# Patient Record
Sex: Male | Born: 1950 | Race: Asian | Hispanic: No | Marital: Married | Smoking: Never smoker
Health system: Southern US, Community
[De-identification: ages and names within clinical notes are randomized; demographics above are authoritative.]

## PROBLEM LIST (undated history)

## (undated) DIAGNOSIS — K219 Gastro-esophageal reflux disease without esophagitis: Secondary | ICD-10-CM

## (undated) DIAGNOSIS — I10 Essential (primary) hypertension: Secondary | ICD-10-CM

## (undated) DIAGNOSIS — E119 Type 2 diabetes mellitus without complications: Secondary | ICD-10-CM

## (undated) DIAGNOSIS — R569 Unspecified convulsions: Secondary | ICD-10-CM

## (undated) DIAGNOSIS — K259 Gastric ulcer, unspecified as acute or chronic, without hemorrhage or perforation: Secondary | ICD-10-CM

## (undated) DIAGNOSIS — F32A Depression, unspecified: Secondary | ICD-10-CM

## (undated) DIAGNOSIS — F329 Major depressive disorder, single episode, unspecified: Secondary | ICD-10-CM

## (undated) DIAGNOSIS — A159 Respiratory tuberculosis unspecified: Secondary | ICD-10-CM

## (undated) DIAGNOSIS — G709 Myoneural disorder, unspecified: Secondary | ICD-10-CM

## (undated) DIAGNOSIS — E7800 Pure hypercholesterolemia, unspecified: Secondary | ICD-10-CM

## (undated) HISTORY — DX: Depression, unspecified: F32.A

## (undated) HISTORY — PX: NO PAST SURGERIES: SHX2092

## (undated) HISTORY — DX: Essential (primary) hypertension: I10

## (undated) HISTORY — DX: Myoneural disorder, unspecified: G70.9

## (undated) HISTORY — DX: Respiratory tuberculosis unspecified: A15.9

## (undated) HISTORY — DX: Gastro-esophageal reflux disease without esophagitis: K21.9

## (undated) HISTORY — DX: Type 2 diabetes mellitus without complications: E11.9

## (undated) HISTORY — DX: Major depressive disorder, single episode, unspecified: F32.9

## (undated) HISTORY — DX: Unspecified convulsions: R56.9

## (undated) HISTORY — DX: Pure hypercholesterolemia, unspecified: E78.00

## (undated) HISTORY — DX: Gastric ulcer, unspecified as acute or chronic, without hemorrhage or perforation: K25.9

---

## 2012-04-18 ENCOUNTER — Other Ambulatory Visit: Payer: Self-pay | Admitting: Infectious Diseases

## 2012-04-18 ENCOUNTER — Ambulatory Visit
Admission: RE | Admit: 2012-04-18 | Discharge: 2012-04-18 | Disposition: A | Discharge status: Home / Self Care | Payer: No Typology Code available for payment source | Source: Ambulatory Visit | Attending: Infectious Diseases | Admitting: Infectious Diseases

## 2012-04-18 DIAGNOSIS — Z0289 Encounter for other administrative examinations: Secondary | ICD-10-CM

## 2014-10-07 ENCOUNTER — Ambulatory Visit: Payer: Self-pay | Attending: Internal Medicine

## 2015-03-12 ENCOUNTER — Encounter: Payer: Self-pay | Admitting: Family Medicine

## 2015-03-12 ENCOUNTER — Ambulatory Visit (INDEPENDENT_AMBULATORY_CARE_PROVIDER_SITE_OTHER): Payer: 59 | Admitting: Family Medicine

## 2015-03-12 VITALS — BP 154/94 | HR 60 | Ht 63.78 in | Wt 134.7 lb

## 2015-03-12 DIAGNOSIS — M62830 Muscle spasm of back: Secondary | ICD-10-CM

## 2015-03-12 DIAGNOSIS — I10 Essential (primary) hypertension: Secondary | ICD-10-CM | POA: Diagnosis not present

## 2015-03-12 DIAGNOSIS — K219 Gastro-esophageal reflux disease without esophagitis: Secondary | ICD-10-CM | POA: Diagnosis not present

## 2015-03-12 MED ORDER — AMLODIPINE BESYLATE 5 MG PO TABS: 5.0000 mg | ORAL_TABLET | Freq: Every day | ORAL | Status: DC

## 2015-03-12 MED ORDER — CYCLOBENZAPRINE HCL 5 MG PO TABS: 5.0000 mg | ORAL_TABLET | Freq: Three times a day (TID) | ORAL | Status: DC | PRN

## 2015-03-12 NOTE — Progress Notes (Signed)
    Subjective    Manuel Reyes is a 64 y.o. male that presents to establish care.   HPI:  1. Hypertension: Has not been taking his medications for the past week because he been out. He was getting his care at [facility] [address]. He reports taking one blood pressure pill daily. No chest pain or shortness of breath  2. Back pain: Symptoms started last year. Located in the right flank. Pain is characterized as cramping. Pain especially hurts in the morning and improves as the day go by. Pain is worse with lying down. He has not taken any medication. He is currently not working. He does not perform heavy lifting. No fevers or nausea. He reports some numbness in his left leg that radiates down to his ankles.  Past Medical History  Diagnosis Date  . Hypertension   . Stomach ulcer     History reviewed. No pertinent past surgical history.  No current outpatient prescriptions on file prior to visit.   No current facility-administered medications on file prior to visit.    No Known Allergies  History   Social History  . Marital Status: Married    Spouse Name: N/A  . Number of Children: N/A  . Years of Education: N/A   Social History Main Topics  . Smoking status: Never Smoker   . Smokeless tobacco: Not on file  . Alcohol Use: 0.6 oz/week    1 Cans of beer per week  . Drug Use: No  . Sexual Activity: Not Currently   Other Topics Concern  . None   Social History Narrative  . None    History reviewed. No pertinent family history.  ROS  Per HPI  Objective   BP 154/94 mmHg  Pulse 60  Ht 5' 3.78" (1.62 m)  Wt 134 lb 11.2 oz (61.1 kg)  BMI 23.28 kg/m2  SpO2 100%  General: Fair appearing, slightly unkempt. Translator sitting beside patient HEENT: TMs not visualized secondary to cerumen. Eyes anicteric. Nose normal. Oropharynx clear with severely deteriorated dentition with associated caries and plaques Respiratory/Chest: Clear to auscultation bilaterally,  no wheezing Cardiovascular: Regular rate and rhythm Gastrointestinal: Soft, slight tenderness, no rebound, no distension, no guarding Genitourinary: Not examined    Musculoskeletal: Right latissimus dorsi with spasm and slight tenderness. Thoracic kyphosis present. No midline tenderness. Straight leg negative Neuro: Alert, oriented. CN VIII dysfunctional. CN2-7, 9-12 intact Dermatologic: No rashes Psychiatric: Flat affect. No suicidal ideation  Meds ordered this encounter  Medications  . cyclobenzaprine (FLEXERIL) 5 MG tablet    Sig: Take 1 tablet (5 mg total) by mouth 3 (three) times daily as needed for muscle spasms.    Dispense:  20 tablet    Refill:  0  . DISCONTD: amLODipine (NORVASC) 5 MG tablet    Sig: Take 5 mg by mouth daily.  Marland Kitchen amLODipine (NORVASC) 5 MG tablet    Sig: Take 1 tablet (5 mg total) by mouth daily.    Dispense:  90 tablet    Refill:  2  . omeprazole (PRILOSEC) 20 MG capsule    Sig: Take 20 mg by mouth daily.    Assessment and Plan    Establish Care  Fasting labs: CBC w/ diff, CMP, Lipid panel, TSH, A1C  Please refer to problem based charting of assessment and plan

## 2015-03-12 NOTE — Patient Instructions (Signed)
Thank you for coming to see me today. It was a pleasure. Today we talked about:   Back pain: this is related to a big muscle spasm in your back. I want you to use warm compresses to help improve the cramp. I am also prescribing flexeril, which can cause you to be sleepier than usual.  Please make an appointment to see me in 4 weeks for follow-up.  If you have any questions or concerns, please do not hesitate to call the office at [phone].  Sincerely,  Cordelia Poche, MD    Muscle Cramps and Spasms Muscle cramps and spasms are when muscles tighten by themselves. They usually get better within minutes. Muscle cramps are painful. They are usually stronger and last longer than muscle spasms. Muscle spasms may or may not be painful. They can last a few seconds or much longer. HOME CARE  Drink enough fluid to keep your pee (urine) clear or pale yellow.  Massage, stretch, and relax the muscle.  Use a warm towel, heating pad, or warm shower water on tight muscles.  Place ice on the muscle if it is tender or in pain.  Put ice in a plastic bag.  Place a towel between your skin and the bag.  Leave the ice on for 15-20 minutes, 03-04 times a day.  Only take medicine as told by your doctor. GET HELP RIGHT AWAY IF:  Your cramps or spasms get worse, happen more often, or do not get better with time. MAKE SURE YOU:  Understand these instructions.  Will watch your condition.  Will get help right away if you are not doing well or get worse. Document Released: 09/28/2008 Document Revised: 02/10/2013 Document Reviewed: 10/02/2012 [facility] Patient Information 2015 [place], [place]. This information is not intended to replace advice given to you by your health care provider. Make sure you discuss any questions you have with your health care provider.

## 2015-03-18 DIAGNOSIS — M62830 Muscle spasm of back: Secondary | ICD-10-CM | POA: Insufficient documentation

## 2015-03-18 DIAGNOSIS — I10 Essential (primary) hypertension: Secondary | ICD-10-CM | POA: Insufficient documentation

## 2015-03-18 DIAGNOSIS — K219 Gastro-esophageal reflux disease without esophagitis: Secondary | ICD-10-CM | POA: Insufficient documentation

## 2015-03-18 NOTE — Assessment & Plan Note (Signed)
   Flexeril  Warm compress  Handout given

## 2015-03-18 NOTE — Assessment & Plan Note (Signed)
Patient previously on Amlodipine. No current side effects  Amlodipine 5mg  daily  Recheck blood pressure in two weeks with nurse

## 2015-03-18 NOTE — Assessment & Plan Note (Signed)
Refill omeprazole

## 2015-03-19 ENCOUNTER — Other Ambulatory Visit (INDEPENDENT_AMBULATORY_CARE_PROVIDER_SITE_OTHER): Payer: 59

## 2015-03-19 DIAGNOSIS — I10 Essential (primary) hypertension: Secondary | ICD-10-CM | POA: Diagnosis not present

## 2015-03-19 LAB — CBC WITH DIFFERENTIAL/PLATELET
Basophils Absolute: 0.1 10*3/uL (ref 0.0–0.1)
Basophils Relative: 1 % (ref 0–1)
EOS ABS: 0.1 10*3/uL (ref 0.0–0.7)
EOS PCT: 1 % (ref 0–5)
HCT: 49.6 % (ref 39.0–52.0)
Hemoglobin: 17.2 g/dL — ABNORMAL HIGH (ref 13.0–17.0)
LYMPHS ABS: 2.1 10*3/uL (ref 0.7–4.0)
Lymphocytes Relative: 38 % (ref 12–46)
MCH: 30 pg (ref 26.0–34.0)
MCHC: 34.7 g/dL (ref 30.0–36.0)
MCV: 86.4 fL (ref 78.0–100.0)
MONOS PCT: 15 % — AB (ref 3–12)
MPV: 11.1 fL (ref 8.6–12.4)
Monocytes Absolute: 0.8 10*3/uL (ref 0.1–1.0)
NEUTROS PCT: 45 % (ref 43–77)
Neutro Abs: 2.5 10*3/uL (ref 1.7–7.7)
PLATELETS: 178 10*3/uL (ref 150–400)
RBC: 5.74 MIL/uL (ref 4.22–5.81)
RDW: 14.4 % (ref 11.5–15.5)
WBC: 5.6 10*3/uL (ref 4.0–10.5)

## 2015-03-19 LAB — COMPLETE METABOLIC PANEL WITH GFR
ALK PHOS: 105 U/L (ref 39–117)
ALT: 11 U/L (ref 0–53)
AST: 21 U/L (ref 0–37)
Albumin: 4.1 g/dL (ref 3.5–5.2)
BILIRUBIN TOTAL: 0.8 mg/dL (ref 0.2–1.2)
BUN: 12 mg/dL (ref 6–23)
CO2: 29 mEq/L (ref 19–32)
CREATININE: 0.71 mg/dL (ref 0.50–1.35)
Calcium: 9.4 mg/dL (ref 8.4–10.5)
Chloride: 99 mEq/L (ref 96–112)
GFR, Est African American: >89 mL/min
Glucose, Bld: 94 mg/dL (ref 70–99)
Potassium: 4.1 mEq/L (ref 3.5–5.3)
Sodium: 136 mEq/L (ref 135–145)
Total Protein: 7.9 g/dL (ref 6.0–8.3)

## 2015-03-19 LAB — TSH: TSH: 1.097 u[IU]/mL (ref 0.350–4.500)

## 2015-03-19 LAB — LIPID PANEL
CHOLESTEROL: 232 mg/dL — AB (ref 0–200)
HDL: 55 mg/dL (ref >=40)
LDL Cholesterol: 157 mg/dL — ABNORMAL HIGH (ref 0–99)
Total CHOL/HDL Ratio: 4.2 Ratio
Triglycerides: 98 mg/dL (ref <150)
VLDL: 20 mg/dL (ref 0–40)

## 2015-03-19 LAB — POCT GLYCOSYLATED HEMOGLOBIN (HGB A1C): HEMOGLOBIN A1C: 5.3

## 2015-03-19 NOTE — Progress Notes (Signed)
CMP,CBC,FLP,TSH AND A1C DONE TODAY Manuel Reyes

## 2015-04-15 ENCOUNTER — Ambulatory Visit (INDEPENDENT_AMBULATORY_CARE_PROVIDER_SITE_OTHER): Payer: 59 | Admitting: Family Medicine

## 2015-04-15 ENCOUNTER — Encounter: Payer: Self-pay | Admitting: Family Medicine

## 2015-04-15 VITALS — BP 145/96 | HR 69 | Temp 97.9°F | Ht 64.0 in | Wt 130.9 lb

## 2015-04-15 DIAGNOSIS — I10 Essential (primary) hypertension: Secondary | ICD-10-CM | POA: Diagnosis not present

## 2015-04-15 DIAGNOSIS — E785 Hyperlipidemia, unspecified: Secondary | ICD-10-CM | POA: Diagnosis not present

## 2015-04-15 DIAGNOSIS — M62830 Muscle spasm of back: Secondary | ICD-10-CM

## 2015-04-15 DIAGNOSIS — R51 Headache: Secondary | ICD-10-CM

## 2015-04-15 DIAGNOSIS — R519 Headache, unspecified: Secondary | ICD-10-CM

## 2015-04-15 MED ORDER — AMLODIPINE BESYLATE 5 MG PO TABS: 5.0000 mg | ORAL_TABLET | Freq: Every day | ORAL | Status: DC

## 2015-04-15 MED ORDER — ROSUVASTATIN CALCIUM 20 MG PO TABS: 20.0000 mg | ORAL_TABLET | Freq: Every day | ORAL | Status: DC

## 2015-04-15 MED ORDER — VENLAFAXINE HCL ER 37.5 MG PO CP24: 37.5000 mg | ORAL_CAPSULE | Freq: Every day | ORAL | Status: DC

## 2015-04-15 NOTE — Patient Instructions (Signed)
Thank you for coming to see me today. It was a pleasure. Today we talked about:   Muscle spasm: I will refer you to physical therapy  Hypertension: I am increasing you to amlodipine 10mg  daily  Hyperlipidemia: I am starting you on crestor 20mg  daily  Headache: This may be a migraine. I will try you on Effexor 37.5mg  daily to see if this helps with your headaches  Please make an appointment to see me in 4 weeks for follow-up.  If you have any questions or concerns, please do not hesitate to call the office at [phone].  Sincerely,  Cordelia Poche, MD

## 2015-04-15 NOTE — Progress Notes (Signed)
    Subjective    Manuel Reyes is a 64 y.o. male that presents for a follow-up visit for chronic issues.   1. Back pain: Symptoms are persisting. They are unchanged. No physical activity to aggravate symptoms. He states walking every day for about 20-30 minutes. Pain is worse when moving. Straightening his back improves his pain, however, he is unable to keep his back straight for too long as he gets tired. His back pain affects him negatively at work.  2. Hypertension: He is adherent with amlodipine 5mg  daily. He reports no side effects. He reports no chest pain or shortness of breath.  3. Headaches: He has occasional headaches, which he has had for about one year every day and have worsened. Headaches are sharp and located bilateral at times. Patient states he has photophobia with these headaches. Patient states he cannot sleep when having these headaches and that they last about 2-3 hours.   4. Hypercholesterolemia: Recent lipid panel significant for elevated LDL to 157.  History  Substance Use Topics  . Smoking status: Never Smoker   . Smokeless tobacco: Not on file  . Alcohol Use: 0.6 oz/week    1 Cans of beer per week    No Known Allergies  No orders of the defined types were placed in this encounter.    ROS  Per HPI   Objective   BP 145/96 mmHg  Pulse 69  Temp(Src) 97.9 F (36.6 C) (Oral)  Ht 5\' 4"  (1.626 m)  Wt 130 lb 14.4 oz (59.376 kg)  BMI 22.46 kg/m2  General: Well appearing, no distress HEENT: No temporal tenderness. No scalp tenderness. No lesions noted on scalp.    Musculoskeletal: Severe spasm of bilateral latissimus dorsi with tenderness. No lesions noted. Thoracic kyphosis present Neuro: Alert, oriented. CN II, III, IV, V, VI, VII intact  Assessment and Plan   Please refer to problem based charting of assessment and plan

## 2015-04-16 DIAGNOSIS — R51 Headache: Secondary | ICD-10-CM

## 2015-04-16 DIAGNOSIS — R519 Headache, unspecified: Secondary | ICD-10-CM | POA: Insufficient documentation

## 2015-04-16 NOTE — Assessment & Plan Note (Signed)
ASCVD risk of 15.3%. Currently not on a statin. No CAD equivalents currently.  Primary prevention  Crestor 20mg  daily

## 2015-04-16 NOTE — Assessment & Plan Note (Signed)
Slightly uncontrolled today. Will increase to amlodipine 10mg  daily

## 2015-04-16 NOTE — Assessment & Plan Note (Signed)
Refer to physical therapy 

## 2015-04-16 NOTE — Assessment & Plan Note (Signed)
Possible migraine headache. Will trial Effexor 37.5mg  daily

## 2015-05-21 ENCOUNTER — Encounter: Payer: Self-pay | Admitting: Family Medicine

## 2015-05-21 ENCOUNTER — Ambulatory Visit (HOSPITAL_COMMUNITY)
Admission: RE | Admit: 2015-05-21 | Discharge: 2015-05-21 | Disposition: A | Discharge status: Home / Self Care | Payer: 59 | Source: Ambulatory Visit | Attending: Family Medicine | Admitting: Family Medicine

## 2015-05-21 ENCOUNTER — Ambulatory Visit (INDEPENDENT_AMBULATORY_CARE_PROVIDER_SITE_OTHER): Payer: 59 | Admitting: Family Medicine

## 2015-05-21 VITALS — BP 152/91 | HR 72 | Temp 97.6°F | Ht 64.0 in | Wt 128.3 lb

## 2015-05-21 DIAGNOSIS — M62830 Muscle spasm of back: Secondary | ICD-10-CM

## 2015-05-21 DIAGNOSIS — R002 Palpitations: Secondary | ICD-10-CM | POA: Insufficient documentation

## 2015-05-21 DIAGNOSIS — K219 Gastro-esophageal reflux disease without esophagitis: Secondary | ICD-10-CM | POA: Diagnosis not present

## 2015-05-21 DIAGNOSIS — I10 Essential (primary) hypertension: Secondary | ICD-10-CM | POA: Diagnosis not present

## 2015-05-21 DIAGNOSIS — R001 Bradycardia, unspecified: Secondary | ICD-10-CM | POA: Diagnosis not present

## 2015-05-21 DIAGNOSIS — R0600 Dyspnea, unspecified: Secondary | ICD-10-CM | POA: Insufficient documentation

## 2015-05-21 MED ORDER — AMLODIPINE BESYLATE 10 MG PO TABS: 10.0000 mg | ORAL_TABLET | Freq: Every day | ORAL | Status: DC

## 2015-05-21 NOTE — Patient Instructions (Addendum)
Thank you for coming to see me today. It was a pleasure. Today we talked about:   Hypertension: I will increase your amlodipine. Please take Amlodipine 10mg  daily  Reflux: no changes  Palpitations/shortness of breath: I did an EKG in the office. I will get an echocardiogram.  Please make an appointment to see me in 3 months follow-up  If you have any questions or concerns, please do not hesitate to call the office at [phone].  Sincerely,  Cordelia Poche, MD

## 2015-05-21 NOTE — Assessment & Plan Note (Signed)
Uncontrolled. I thought I had increased his amlodipine to 10 mg last visit but appears I did not. Will increase to Amlodipine 10mg  daily.

## 2015-05-21 NOTE — Progress Notes (Signed)
    Subjective    Manuel Reyes is a 64 y.o. male that presents for a follow-up visit for chronic issues.   1. Request for disability: Patient states he is having issues remembering, some dizziness, in addition to his back pain that makes him feel like he cannot work and is requesting disability.  2. Hypertension: Adherent with amlodipine 5mg  daily. No chest pain but states he sometimes has shortness of breath at night. He sleeps on one pillow at home. He states he has some PND. No coughing or wheezing. No leg swelling. He has frequent palpitations that occur a few times per day without any obvious stimulus.  3. GERD: Still having symptoms. He is adherent with omeprazole and has adjusted his diet a little. He has symptoms with meats and butter, of which he is eating less.  History  Substance Use Topics  . Smoking status: Never Smoker   . Smokeless tobacco: Not on file  . Alcohol Use: 0.6 oz/week    1 Cans of beer per week    No Known Allergies  No orders of the defined types were placed in this encounter.    ROS  Per HPI   Objective   BP 152/91 mmHg  Pulse 72  Temp(Src) 97.6 F (36.4 C) (Oral)  Ht 5\' 4"  (1.626 m)  Wt 128 lb 4.8 oz (58.196 kg)  BMI 22.01 kg/m2  General: Well appearing. Cardiovascular: RRR, no murmur, mild JVP, no bruits noted Respiratory: CTAB, no wheezing Extremities: no swelling in legs  Assessment and Plan   Please refer to problem based charting of assessment and plan

## 2015-05-21 NOTE — Assessment & Plan Note (Signed)
Story somewhat suggest of possible heart failure. Exam benign except for some jvp. No other signs of fluid overload.

## 2015-05-21 NOTE — Assessment & Plan Note (Signed)
Discussed treatment by avoiding triggers and continuing omeprazole as needed

## 2015-06-16 ENCOUNTER — Ambulatory Visit (HOSPITAL_COMMUNITY): Payer: 59 | Attending: Cardiovascular Disease

## 2015-06-16 ENCOUNTER — Other Ambulatory Visit: Payer: Self-pay

## 2015-06-16 DIAGNOSIS — I10 Essential (primary) hypertension: Secondary | ICD-10-CM | POA: Insufficient documentation

## 2015-06-16 DIAGNOSIS — R0600 Dyspnea, unspecified: Secondary | ICD-10-CM | POA: Diagnosis not present

## 2015-06-16 DIAGNOSIS — I071 Rheumatic tricuspid insufficiency: Secondary | ICD-10-CM | POA: Diagnosis not present

## 2015-06-16 DIAGNOSIS — E785 Hyperlipidemia, unspecified: Secondary | ICD-10-CM | POA: Diagnosis not present

## 2015-06-24 ENCOUNTER — Encounter: Payer: Self-pay | Admitting: Family Medicine

## 2015-06-29 ENCOUNTER — Telehealth: Payer: Self-pay | Admitting: *Deleted

## 2015-06-29 NOTE — Telephone Encounter (Signed)
-----   Message from Mariel Aloe, MD sent at 06/27/2015  9:35 AM EDT ----- I meant for you to just call him in for an appointment to discuss the results. I put a note before that just explaining the pertinent aspects of the results. No need to explain any of it to him. I'll do that when I see him. He does not speak [place] and I doubt a phone call to explain any of it would benefit him. Thanks! ----- Message -----    From: Valerie Roys, CMA    Sent: 06/25/2015  11:29 AM      To: Mariel Aloe, MD  I am unsure of what this means and feel uncomfortable explaining this to the patient since it is abnormal. Johnney Ou  ----- Message -----    From: Mariel Aloe, MD    Sent: 06/24/2015  10:40 AM      To: [facility]  Grade 1 diastolic with mild LVH. With symptoms of PND and mention of elevated CVP, possible could have some extra fluid. Will have patient follow-up in clinic to discuss results and trial on diuretic if still symptomatic. Please call patient to schedule an appointment.

## 2015-06-29 NOTE — Telephone Encounter (Signed)
Used pac[employer] interpreter [place] [id number].  Both home and cell number listed for patient were the wrong number.  Will mail patient a letter translated from [employer] to please contact office for an appt. Ervine Witucki,CMA

## 2015-10-28 ENCOUNTER — Ambulatory Visit (INDEPENDENT_AMBULATORY_CARE_PROVIDER_SITE_OTHER): Payer: Medicaid Other | Admitting: Family Medicine

## 2015-10-28 VITALS — BP 182/103 | HR 83 | Temp 97.9°F | Wt 135.6 lb

## 2015-10-28 DIAGNOSIS — R413 Other amnesia: Secondary | ICD-10-CM | POA: Diagnosis not present

## 2015-10-28 DIAGNOSIS — I5032 Chronic diastolic (congestive) heart failure: Secondary | ICD-10-CM | POA: Insufficient documentation

## 2015-10-28 DIAGNOSIS — I10 Essential (primary) hypertension: Secondary | ICD-10-CM

## 2015-10-28 DIAGNOSIS — Z23 Encounter for immunization: Secondary | ICD-10-CM

## 2015-10-28 DIAGNOSIS — K219 Gastro-esophageal reflux disease without esophagitis: Secondary | ICD-10-CM | POA: Diagnosis not present

## 2015-10-28 MED ORDER — AMLODIPINE BESYLATE 10 MG PO TABS: 10.0000 mg | ORAL_TABLET | Freq: Every day | ORAL | Status: DC
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
## 2015-10-28 NOTE — Assessment & Plan Note (Addendum)
Uncontrolled today, although patient has been non-adherent with regimen. Refill amlodipine 10mg  daily

## 2015-10-28 NOTE — Patient Instructions (Signed)
Thank you for coming to see me today. It was a pleasure. Today we talked about:   Hypertension: I am refilling your amlodipine 10mg   GERD: No changes  Memory loss: I would like for you to bring your son with you so we can look into this further.  Please make an appointment to see me in 1 month for follow-up of memory.  If you have any questions or concerns, please do not hesitate to call the office at [phone].  Sincerely,  Cordelia Poche, MD

## 2015-10-30 DIAGNOSIS — R413 Other amnesia: Secondary | ICD-10-CM | POA: Insufficient documentation

## 2015-10-30 NOTE — Assessment & Plan Note (Signed)
Controlled. No changes. 

## 2015-10-30 NOTE — Assessment & Plan Note (Signed)
Possibly related to age. Patient receives help from son. Recommend that patient and son see me to discuss patient's concerns regarding memory. Will perform workup at that time, including mini-cog and/or MOCA.

## 2015-11-26 ENCOUNTER — Ambulatory Visit (INDEPENDENT_AMBULATORY_CARE_PROVIDER_SITE_OTHER): Payer: Medicaid Other | Admitting: Family Medicine

## 2015-11-26 VITALS — BP 138/83 | HR 77 | Temp 97.9°F | Wt 136.8 lb

## 2015-11-26 DIAGNOSIS — R569 Unspecified convulsions: Secondary | ICD-10-CM | POA: Diagnosis present

## 2015-11-26 DIAGNOSIS — R296 Repeated falls: Secondary | ICD-10-CM

## 2015-11-26 DIAGNOSIS — R413 Other amnesia: Secondary | ICD-10-CM | POA: Diagnosis not present

## 2015-11-26 DIAGNOSIS — R42 Dizziness and giddiness: Secondary | ICD-10-CM | POA: Diagnosis not present

## 2015-11-26 LAB — CBC WITH DIFFERENTIAL/PLATELET
BASOS ABS: 0 10*3/uL (ref 0.0–0.1)
Basophils Relative: 0 % (ref 0–1)
Eosinophils Absolute: 0 10*3/uL (ref 0.0–0.7)
Eosinophils Relative: 0 % (ref 0–5)
HEMATOCRIT: 50.2 % (ref 39.0–52.0)
HEMOGLOBIN: 17.2 g/dL — AB (ref 13.0–17.0)
LYMPHS ABS: 1.1 10*3/uL (ref 0.7–4.0)
LYMPHS PCT: 14 % (ref 12–46)
MCH: 29.7 pg (ref 26.0–34.0)
MCHC: 34.3 g/dL (ref 30.0–36.0)
MCV: 86.7 fL (ref 78.0–100.0)
MONO ABS: 0.7 10*3/uL (ref 0.1–1.0)
MPV: 11 fL (ref 8.6–12.4)
Monocytes Relative: 9 % (ref 3–12)
NEUTROS ABS: 6 10*3/uL (ref 1.7–7.7)
Neutrophils Relative %: 77 % (ref 43–77)
Platelets: 197 10*3/uL (ref 150–400)
RBC: 5.79 MIL/uL (ref 4.22–5.81)
RDW: 14 % (ref 11.5–15.5)
WBC: 7.8 10*3/uL (ref 4.0–10.5)

## 2015-11-26 LAB — COMPLETE METABOLIC PANEL WITH GFR
ALT: 11 U/L (ref 9–46)
AST: 20 U/L (ref 10–35)
Albumin: 4.4 g/dL (ref 3.6–5.1)
Alkaline Phosphatase: 100 U/L (ref 40–115)
BUN: 22 mg/dL (ref 7–25)
CO2: 25 mmol/L (ref 20–31)
CREATININE: 0.69 mg/dL — AB (ref 0.70–1.25)
Calcium: 9.2 mg/dL (ref 8.6–10.3)
Chloride: 101 mmol/L (ref 98–110)
GFR, Est African American: >89 mL/min (ref >=60)
GFR, Est Non African American: >89 mL/min (ref >=60)
Glucose, Bld: 90 mg/dL (ref 65–99)
Potassium: 4.2 mmol/L (ref 3.5–5.3)
Sodium: 138 mmol/L (ref 135–146)
TOTAL PROTEIN: 8.1 g/dL (ref 6.1–8.1)
Total Bilirubin: 0.4 mg/dL (ref 0.2–1.2)

## 2015-11-26 LAB — VITAMIN B12: Vitamin B-12: 627 pg/mL (ref 211–911)

## 2015-11-26 LAB — FOLATE: FOLATE: 11.1 ng/mL

## 2015-11-26 NOTE — Patient Instructions (Signed)
Thank you for coming to see me today. It was a pleasure. Today we talked about:   Falls: this may be secondary to seizures. I will refer you to neurology. I will also get an MRI of your brain. If you notice any more episodes please call [phone] and head to the emergency department.  Memory: I am getting some labs. Also, please follow-up with our geriatric clinic for follow-up. You can also follow-up with me if you're not able to get in soon. Otherwise, make an appointment to see me after your MRI and/or neurology visit  If you have any questions or concerns, please do not hesitate to call the office at [phone].  Sincerely,  Cordelia Poche, MD
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
## 2015-11-27 LAB — RPR

## 2015-11-27 LAB — HIV ANTIBODY (ROUTINE TESTING W REFLEX): HIV 1&2 Ab, 4th Generation: NONREACTIVE

## 2015-12-01 ENCOUNTER — Encounter: Payer: Self-pay | Admitting: Family Medicine

## 2015-12-01 DIAGNOSIS — R569 Unspecified convulsions: Secondary | ICD-10-CM | POA: Insufficient documentation

## 2015-12-01 DIAGNOSIS — R296 Repeated falls: Secondary | ICD-10-CM | POA: Insufficient documentation

## 2015-12-01 NOTE — Assessment & Plan Note (Signed)
Seems very likely from history provided. No recent head trauma so doubt secondary to subdural hemorrhage. Will obtain MRI and refer to neurology for follow-up

## 2015-12-01 NOTE — Assessment & Plan Note (Signed)
Per son's report, sounds consistent with early dementia. Did not give mini moca since did not have access to proper language. Recommend follow-up with geriatric clinic. Would be happy to see him again if not able to get in sooner with geriatric clinic. Will obtain  CBC, CMP, folate, B12, HIV and RPR

## 2015-12-02 ENCOUNTER — Encounter: Payer: Self-pay | Admitting: Family Medicine

## 2015-12-06 ENCOUNTER — Ambulatory Visit (HOSPITAL_COMMUNITY): Payer: Medicaid Other

## 2015-12-10 ENCOUNTER — Ambulatory Visit (HOSPITAL_COMMUNITY)
Admission: RE | Admit: 2015-12-10 | Discharge: 2015-12-10 | Disposition: A | Discharge status: Home / Self Care | Payer: Medicaid Other | Source: Ambulatory Visit | Attending: Family Medicine | Admitting: Family Medicine

## 2015-12-10 DIAGNOSIS — R42 Dizziness and giddiness: Secondary | ICD-10-CM | POA: Diagnosis not present

## 2015-12-10 DIAGNOSIS — R296 Repeated falls: Secondary | ICD-10-CM | POA: Insufficient documentation

## 2015-12-10 DIAGNOSIS — R9089 Other abnormal findings on diagnostic imaging of central nervous system: Secondary | ICD-10-CM | POA: Insufficient documentation

## 2015-12-10 DIAGNOSIS — R413 Other amnesia: Secondary | ICD-10-CM | POA: Diagnosis not present

## 2015-12-10 DIAGNOSIS — R51 Headache: Secondary | ICD-10-CM | POA: Diagnosis not present

## 2015-12-10 MED ORDER — GADOBENATE DIMEGLUMINE 529 MG/ML IV SOLN
15.0000 mL | Freq: Once | INTRAVENOUS | Status: AC | PRN
  Administered 2015-12-10: 12 mL via INTRAVENOUS

## 2015-12-31 ENCOUNTER — Ambulatory Visit: Payer: Medicaid Other | Admitting: Family Medicine

## 2016-01-14 ENCOUNTER — Ambulatory Visit (INDEPENDENT_AMBULATORY_CARE_PROVIDER_SITE_OTHER): Payer: Medicaid Other | Admitting: Family Medicine

## 2016-01-14 ENCOUNTER — Encounter: Payer: Self-pay | Admitting: Family Medicine

## 2016-01-14 VITALS — BP 140/104 | HR 60 | Temp 98.0°F | Ht 64.0 in | Wt 135.4 lb

## 2016-01-14 DIAGNOSIS — R51 Headache: Secondary | ICD-10-CM

## 2016-01-14 DIAGNOSIS — I10 Essential (primary) hypertension: Secondary | ICD-10-CM | POA: Diagnosis not present

## 2016-01-14 DIAGNOSIS — Z789 Other specified health status: Secondary | ICD-10-CM | POA: Diagnosis not present

## 2016-01-14 DIAGNOSIS — M546 Pain in thoracic spine: Secondary | ICD-10-CM | POA: Diagnosis present

## 2016-01-14 DIAGNOSIS — G8929 Other chronic pain: Secondary | ICD-10-CM

## 2016-01-14 MED ORDER — DICLOFENAC SODIUM 75 MG PO TBEC: 75.0000 mg | DELAYED_RELEASE_TABLET | Freq: Two times a day (BID) | ORAL | Status: DC | PRN

## 2016-01-14 NOTE — Progress Notes (Signed)
   Subjective: CC: sinuses [id number] Manuel Reyes is a 65 y.o. male presenting to clinic today for same day appointment. PCP: Cordelia Poche, MD Concerns today include:  Patient presents to the office with headache and pain in the right side of his back that has been going on for several years.  He notes that he has been seen here several times with this complaint and was placed on flexeril for his back, which makes him very dizzy and he feels like he is going to faint.  He had a MRI brain in February that was unremarkable for acute processes.  He was referred to Neurology in January.  Patient reports that they saw the neurologist over 2 months ago, but we discussed that the referral was only placed a little over a month and a half ago.  Son reports that they must have not seen them then.  Headache: reports as chronic, constant, shocking pain in the right posterior aspect of his head.  occ dizziness, nausea, phonophobia.  No photophobia, visual disturbance, imbalance  Back pain: reports as stabbing in the right side of his back, been there for 4 years, worse over the last 3 years.  No falls, numbness, tingling.  Not relieved by Flexeril.  Social History Reviewed. FamHx and MedHx reviewed.  Please see EMR. Health Maintenance: Colonoscopy due  ROS: Per HPI  Objective: Office vital signs reviewed. BP 140/104 mmHg  Pulse 60  Temp(Src) 98 F (36.7 C) (Oral)  Ht 5\' 4"  (1.626 m)  Wt 135 lb 6.4 oz (61.417 kg)  BMI 23.23 kg/m2  Physical Examination:  General: Awake, alert, thin, elderly male, No acute distress HEENT: Normal    Neck: No masses palpated. No lymphadenopathy    Ears: Tympanic membranes intact, dulled light reflex, no erythema, no bulging    Eyes: PERRLA, formal testing of EOMI difficult due to patient's inability to comply with exam, grossly appears to be intact.    Nose: nasal turbinates moist    Throat: moist mucus membranes, no erythema Cardio: regular rate and rhythm, S1S2  heard, no murmurs appreciated Pulm: clear to auscultation bilaterally, no wheezes, rhonchi or rales, normal WOB on room air. MSK: Normal gait and station  Spine: increased thoracic kyphosis, mild TTP to right posterior ribs 8-11, no palpable bony abnormalities in this region, no midline TTP to spine Neuro: Strength and sensation grossly intact, CN 2-12 grossly in tact.  Assessment/ Plan: 65 y.o. male   1. Right-sided thoracic back pain.  Increased kyphosis of thoracic spine.  No neurologic deficits on physical exam - Refer to Physical therapy, suspect that this will take a long time to improve considering the chronicity - Stop Flexeril, start Voltaren prn - Take with meals - Return precautions reviewed  2. Chronic nonintractable headache, unspecified headache type.  Migraine vs tension vs uncontrolled HTN. - Use Tylenol and Voltaren as needed - Referral to neuro.  appt set up today.  3. Essential hypertension - Diastolic BP elevated.  Reports compliance with norvasc.  Took med this am. - Recommend repeat BP in 1 week.  May consider adding a med to help better control BP.  4. Language barrier affecting health care - [employer] video interpreter [place] ID [id number] used for [place] translation - Neuro appt set up during this appointment  Total time spent with patient 35 minutes.  Greater than 50% of encounter spent in coordination of care/counseling.   Janora Norlander, DO PGY-2, [place]

## 2016-01-14 NOTE — Patient Instructions (Signed)
You should follow up with the neurologist.  I have placed an order for physical therapy as well.  Stop taking cyclobenzaprine and start taking diclofenac.

## 2016-01-21 ENCOUNTER — Ambulatory Visit (INDEPENDENT_AMBULATORY_CARE_PROVIDER_SITE_OTHER): Payer: Medicaid Other | Admitting: Neurology

## 2016-01-21 ENCOUNTER — Encounter: Payer: Self-pay | Admitting: Neurology

## 2016-01-21 VITALS — BP 158/93 | HR 57 | Ht 64.0 in | Wt 138.0 lb

## 2016-01-21 DIAGNOSIS — R413 Other amnesia: Secondary | ICD-10-CM | POA: Diagnosis not present

## 2016-01-21 DIAGNOSIS — R404 Transient alteration of awareness: Secondary | ICD-10-CM | POA: Diagnosis not present

## 2016-01-21 DIAGNOSIS — G629 Polyneuropathy, unspecified: Secondary | ICD-10-CM | POA: Diagnosis not present

## 2016-01-21 MED ORDER — LEVETIRACETAM 500 MG PO TABS: 500.0000 mg | ORAL_TABLET | Freq: Two times a day (BID) | ORAL | Status: DC

## 2016-01-21 NOTE — Patient Instructions (Signed)
Remember to drink plenty of fluid, eat healthy meals and do not skip any meals. Try to eat protein with a every meal and eat a healthy snack such as fruit or nuts in between meals. Try to keep a regular sleep-wake schedule and try to exercise daily, particularly in the form of walking, 20-30 minutes a day, if you can.   As far as your medications are concerned, I would like to suggest; Keppra 500mg  twice daily  As far as diagnostic testing: Labs.  I would like to see you back in 4-6 weeks, sooner if we need to. Please call us with any interim questions, concerns, problems, updates or refill requests.   Our phone number is [phone]. We also have an after hours call service for urgent matters and there is a physician on-call for urgent questions. For any emergencies you know to call 911 or go to the nearest emergency room
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
## 2016-01-26 LAB — MULTIPLE MYELOMA PANEL, SERUM
ALBUMIN SERPL ELPH-MCNC: 3.8 g/dL (ref 2.9–4.4)
Albumin/Glob SerPl: 1 (ref 0.7–1.7)
Alpha 1: 0.2 g/dL (ref 0.0–0.4)
Alpha2 Glob SerPl Elph-Mcnc: 0.7 g/dL (ref 0.4–1.0)
B-GLOBULIN SERPL ELPH-MCNC: 1.3 g/dL (ref 0.7–1.3)
GAMMA GLOB SERPL ELPH-MCNC: 1.6 g/dL (ref 0.4–1.8)
GLOBULIN, TOTAL: 3.9 g/dL (ref 2.2–3.9)
IGA/IMMUNOGLOBULIN A, SERUM: 407 mg/dL (ref 61–437)
IgG (Immunoglobin G), Serum: 1379 mg/dL (ref 700–1600)
IgM (Immunoglobulin M), Srm: 92 mg/dL (ref 20–172)
TOTAL PROTEIN: 7.7 g/dL (ref 6.0–8.5)

## 2016-01-26 LAB — ANA W/REFLEX: Anti Nuclear Antibody(ANA): NEGATIVE

## 2016-01-27 ENCOUNTER — Telehealth: Payer: Self-pay | Admitting: *Deleted

## 2016-01-27 NOTE — Telephone Encounter (Signed)
-----   Message from Melvenia Beam, MD sent at 01/27/2016 12:22 PM EDT ----- Labs normal. thanks

## 2016-01-27 NOTE — Telephone Encounter (Signed)
Called and spoke with language line interpreter. Interpreter :[id number]. She LVM for pt to call back. Gave [facility] Phone number. Ok to inform pt labs normal. Might need to call back with interpreter though.

## 2016-02-08 NOTE — Telephone Encounter (Signed)
Called and spoke with language line interpreter. Interpreter ID: [id number]. He called [phone]. spoek to pt abs are normal per Dr Jaynee Eagles. Advised him to keep appt for tomorrow for EEG at 830am, check in 8am. He verbalized understanding.

## 2016-02-09 ENCOUNTER — Ambulatory Visit (INDEPENDENT_AMBULATORY_CARE_PROVIDER_SITE_OTHER): Payer: Medicaid Other | Admitting: Neurology

## 2016-02-09 DIAGNOSIS — R404 Transient alteration of awareness: Secondary | ICD-10-CM

## 2016-02-09 DIAGNOSIS — R410 Disorientation, unspecified: Secondary | ICD-10-CM

## 2016-02-09 NOTE — Procedures (Signed)
    History:  Manuel Reyes is a 65 year old gentleman with a history of confusion over the last 2 years. The patient is having unusual behavior, cooking on the stove without turning the stove on, and some history of episodes of passing out. The patient is being evaluated for possible seizures.  This is a routine EEG. No skull defects are noted. Medications include Norvasc, Voltaren, Keppra, Prilosec, Crestor, and Effexor.   EEG classification: Normal awake  Description of the recording: The background rhythms of this recording consists of a fairly well modulated medium amplitude alpha rhythm of 10 Hz that is reactive to eye opening and closure. As the record progresses, the patient appears to remain in the waking state throughout the recording. Photic stimulation was performed, resulting in a bilateral and symmetric photic driving response. Hyperventilation was also performed, resulting in a minimal buildup of the background rhythm activities without significant slowing seen. At no time during the recording does there appear to be evidence of spike or spike wave discharges or evidence of focal slowing. EKG monitor shows no evidence of cardiac rhythm abnormalities with a heart rate of 60.  Impression: This is a normal EEG recording in the waking state. No evidence of ictal or interictal discharges are seen.

## 2016-02-10 ENCOUNTER — Telehealth: Payer: Self-pay | Admitting: *Deleted

## 2016-02-10 NOTE — Telephone Encounter (Signed)
Called interpreter line. Interpreter ID: [id number]. Napli. She called pt. LVM about normal EEG results and to keep f/u for next week to see Dr Jaynee Eagles. Make sure to bring his son with him to appointment. Gave [facility] phone number.

## 2016-02-15 ENCOUNTER — Encounter: Payer: Self-pay | Admitting: *Deleted

## 2016-02-15 NOTE — Progress Notes (Signed)
Faxed completed form to [facility] to schedule pt for in-home 72-hour EEG. Fax: [phone]. Received confirmation.   Received physician status notification from [facility] that they received referral and they will contact us once pt is scheduled for 72hr AMG EEG.

## 2016-02-16 ENCOUNTER — Ambulatory Visit (INDEPENDENT_AMBULATORY_CARE_PROVIDER_SITE_OTHER): Payer: Medicaid Other | Admitting: Neurology

## 2016-02-16 ENCOUNTER — Encounter: Payer: Self-pay | Admitting: Neurology

## 2016-02-16 VITALS — BP 140/90 | HR 82 | Resp 20 | Ht 64.0 in | Wt 133.0 lb

## 2016-02-16 DIAGNOSIS — F341 Dysthymic disorder: Secondary | ICD-10-CM

## 2016-02-16 DIAGNOSIS — R4589 Other symptoms and signs involving emotional state: Secondary | ICD-10-CM

## 2016-02-16 DIAGNOSIS — R413 Other amnesia: Secondary | ICD-10-CM

## 2016-02-16 DIAGNOSIS — R404 Transient alteration of awareness: Secondary | ICD-10-CM

## 2016-02-16 NOTE — Patient Instructions (Signed)
Remember to drink plenty of fluid, eat healthy meals and do not skip any meals. Try to eat protein with a every meal and eat a healthy snack such as fruit or nuts in between meals. Try to keep a regular sleep-wake schedule and try to exercise daily, particularly in the form of walking, 20-30 minutes a day, if you can.   As far as diagnostic testing: 3-day EEG. 3 days before the scheduled EEG take keppra once daily then the day before the test stop the keppra.   I would like to see you back in 3 months, sooner if we need to. Please call us with any interim questions, concerns, problems, updates or refill requests.   Please also call us for any test results so we can go over those with you on the phone.  My clinical assistant and will answer any of your questions and relay your messages to me and also relay most of my messages to you.   Our phone number is [phone]. We also have an after hours call service for urgent matters and there is a physician on-call for urgent questions. For any emergencies you know to call 911 or go to the nearest emergency room
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
## 2016-02-18 ENCOUNTER — Other Ambulatory Visit: Payer: Self-pay | Admitting: *Deleted

## 2016-02-18 DIAGNOSIS — R404 Transient alteration of awareness: Secondary | ICD-10-CM

## 2016-02-21 ENCOUNTER — Telehealth: Payer: Self-pay | Admitting: Neurology

## 2016-02-21 NOTE — Telephone Encounter (Signed)
Called and left message for Sonya at [facility] to schedule Cardiac event monitor .

## 2016-02-24 NOTE — Progress Notes (Signed)
Received physician status notification from [facility] that the pt is scheduled 02/25/16-02/28/16 for 72-hr AMB EEG.

## 2016-03-02 NOTE — Progress Notes (Signed)
Received physician status notification from [facility] that they notified Dr Tasia Catchings and they forward results to our office once report created/interpreted by Dr Tasia Catchings.

## 2016-03-29 ENCOUNTER — Telehealth: Payer: Self-pay | Admitting: *Deleted

## 2016-03-29 NOTE — Telephone Encounter (Signed)
thanks

## 2016-03-29 NOTE — Telephone Encounter (Signed)
Dr Jaynee Eagles- FYI Called interpreter line, spoke to [facility] interpreter ID # [id number]. He called and LVM for pt about normal EEG results for 3-day EEG per Dr Jaynee Eagles. He gave Victor phone number if pt has further questions.

## 2016-04-16 ENCOUNTER — Ambulatory Visit (HOSPITAL_COMMUNITY)
Admission: EM | Admit: 2016-04-16 | Discharge: 2016-04-16 | Disposition: A | Discharge status: Home / Self Care | Payer: Medicaid Other | Attending: Emergency Medicine | Admitting: Emergency Medicine

## 2016-04-16 ENCOUNTER — Emergency Department (HOSPITAL_COMMUNITY): Payer: Medicaid Other

## 2016-04-16 ENCOUNTER — Encounter (HOSPITAL_COMMUNITY): Payer: Self-pay

## 2016-04-16 ENCOUNTER — Other Ambulatory Visit: Payer: Self-pay

## 2016-04-16 ENCOUNTER — Encounter (HOSPITAL_COMMUNITY)
Admission: EM | Disposition: A | Discharge status: Home / Self Care | Payer: Self-pay | Source: Home / Self Care | Attending: Emergency Medicine

## 2016-04-16 ENCOUNTER — Emergency Department (HOSPITAL_COMMUNITY): Payer: Medicaid Other | Admitting: Anesthesiology

## 2016-04-16 DIAGNOSIS — E7800 Pure hypercholesterolemia, unspecified: Secondary | ICD-10-CM | POA: Diagnosis not present

## 2016-04-16 DIAGNOSIS — Z7984 Long term (current) use of oral hypoglycemic drugs: Secondary | ICD-10-CM | POA: Diagnosis not present

## 2016-04-16 DIAGNOSIS — X58XXXA Exposure to other specified factors, initial encounter: Secondary | ICD-10-CM | POA: Insufficient documentation

## 2016-04-16 DIAGNOSIS — Z79899 Other long term (current) drug therapy: Secondary | ICD-10-CM | POA: Insufficient documentation

## 2016-04-16 DIAGNOSIS — E119 Type 2 diabetes mellitus without complications: Secondary | ICD-10-CM | POA: Diagnosis not present

## 2016-04-16 DIAGNOSIS — S68113A Complete traumatic metacarpophalangeal amputation of left middle finger, initial encounter: Secondary | ICD-10-CM

## 2016-04-16 DIAGNOSIS — W268XXA Contact with other sharp object(s), not elsewhere classified, initial encounter: Secondary | ICD-10-CM | POA: Diagnosis not present

## 2016-04-16 DIAGNOSIS — S68115A Complete traumatic metacarpophalangeal amputation of left ring finger, initial encounter: Secondary | ICD-10-CM | POA: Diagnosis not present

## 2016-04-16 DIAGNOSIS — K219 Gastro-esophageal reflux disease without esophagitis: Secondary | ICD-10-CM | POA: Insufficient documentation

## 2016-04-16 DIAGNOSIS — I10 Essential (primary) hypertension: Secondary | ICD-10-CM | POA: Insufficient documentation

## 2016-04-16 HISTORY — PX: AMPUTATION: SHX166

## 2016-04-16 LAB — CBC WITH DIFFERENTIAL/PLATELET
BASOS ABS: 0 10*3/uL (ref 0.0–0.1)
BASOS PCT: 1 %
EOS ABS: 0 10*3/uL (ref 0.0–0.7)
Eosinophils Relative: 1 %
HEMATOCRIT: 49.5 % (ref 39.0–52.0)
HEMOGLOBIN: 16.6 g/dL (ref 13.0–17.0)
Lymphocytes Relative: 18 %
Lymphs Abs: 1 10*3/uL (ref 0.7–4.0)
MCH: 29.6 pg (ref 26.0–34.0)
MCHC: 33.5 g/dL (ref 30.0–36.0)
MCV: 88.2 fL (ref 78.0–100.0)
Monocytes Absolute: 0.7 10*3/uL (ref 0.1–1.0)
Monocytes Relative: 12 %
NEUTROS ABS: 3.7 10*3/uL (ref 1.7–7.7)
NEUTROS PCT: 68 %
Platelets: 141 10*3/uL — ABNORMAL LOW (ref 150–400)
RBC: 5.61 MIL/uL (ref 4.22–5.81)
RDW: 13.2 % (ref 11.5–15.5)
WBC: 5.4 10*3/uL (ref 4.0–10.5)

## 2016-04-16 LAB — I-STAT CHEM 8, ED
BUN: 11 mg/dL (ref 6–20)
CALCIUM ION: 1.2 mmol/L (ref 1.13–1.30)
CREATININE: 0.8 mg/dL (ref 0.61–1.24)
Chloride: 103 mmol/L (ref 101–111)
Glucose, Bld: 93 mg/dL (ref 65–99)
HEMATOCRIT: 52 % (ref 39.0–52.0)
Hemoglobin: 17.7 g/dL — ABNORMAL HIGH (ref 13.0–17.0)
Potassium: 4 mmol/L (ref 3.5–5.1)
SODIUM: 144 mmol/L (ref 135–145)
TCO2: 29 mmol/L (ref 0–100)

## 2016-04-16 LAB — GLUCOSE, CAPILLARY: GLUCOSE-CAPILLARY: 99 mg/dL (ref 65–99)

## 2016-04-16 SURGERY — AMPUTATION DIGIT
Anesthesia: General | Site: Hand | Laterality: Left

## 2016-04-16 MED ORDER — DEXAMETHASONE SODIUM PHOSPHATE 10 MG/ML IJ SOLN
INTRAMUSCULAR | Status: AC
  Filled 2016-04-16: qty 1

## 2016-04-16 MED ORDER — OXYCODONE-ACETAMINOPHEN 5-325 MG PO TABS: 1.0000 | ORAL_TABLET | ORAL | Status: DC | PRN

## 2016-04-16 MED ORDER — BUPIVACAINE HCL (PF) 0.25 % IJ SOLN
INTRAMUSCULAR | Status: DC | PRN
Start: 2016-04-16 — End: 2016-04-16
  Administered 2016-04-16: 8 mL

## 2016-04-16 MED ORDER — CEFAZOLIN SODIUM 1 G IJ SOLR
INTRAMUSCULAR | Status: DC | PRN
  Administered 2016-04-16: 1 g via INTRAMUSCULAR

## 2016-04-16 MED ORDER — FENTANYL CITRATE (PF) 250 MCG/5ML IJ SOLN
INTRAMUSCULAR | Status: AC
Start: 2016-04-16 — End: 2016-04-16
  Filled 2016-04-16: qty 5

## 2016-04-16 MED ORDER — CEPHALEXIN 500 MG PO CAPS: 500.0000 mg | ORAL_CAPSULE | Freq: Four times a day (QID) | ORAL | Status: DC

## 2016-04-16 MED ORDER — DEXAMETHASONE SODIUM PHOSPHATE 4 MG/ML IJ SOLN
INTRAMUSCULAR | Status: DC | PRN
  Administered 2016-04-16: 4 mg via INTRAVENOUS

## 2016-04-16 MED ORDER — 0.9 % SODIUM CHLORIDE (POUR BTL) OPTIME
TOPICAL | Status: DC | PRN
  Administered 2016-04-16: 1000 mL

## 2016-04-16 MED ORDER — FENTANYL CITRATE (PF) 250 MCG/5ML IJ SOLN
INTRAMUSCULAR | Status: AC
  Filled 2016-04-16: qty 5

## 2016-04-16 MED ORDER — SODIUM CHLORIDE 0.9 % IV SOLN
INTRAVENOUS | Status: DC | PRN
  Administered 2016-04-16: 18:00:00 via INTRAVENOUS

## 2016-04-16 MED ORDER — ONDANSETRON HCL 4 MG/2ML IJ SOLN
INTRAMUSCULAR | Status: AC
  Filled 2016-04-16: qty 2

## 2016-04-16 MED ORDER — LIDOCAINE HCL (CARDIAC) 20 MG/ML IV SOLN
INTRAVENOUS | Status: DC | PRN
  Administered 2016-04-16: 100 mg via INTRATRACHEAL

## 2016-04-16 MED ORDER — TETANUS-DIPHTH-ACELL PERTUSSIS 5-2.5-18.5 LF-MCG/0.5 IM SUSP
0.5000 mL | Freq: Once | INTRAMUSCULAR | Status: AC
  Administered 2016-04-16: 0.5 mL via INTRAMUSCULAR
  Filled 2016-04-16: qty 0.5

## 2016-04-16 MED ORDER — CEFAZOLIN SODIUM 1-5 GM-% IV SOLN
1.0000 g | Freq: Three times a day (TID) | INTRAVENOUS | Status: DC
  Administered 2016-04-16: 1 g via INTRAVENOUS
  Filled 2016-04-16: qty 50

## 2016-04-16 MED ORDER — BUPIVACAINE HCL (PF) 0.25 % IJ SOLN
INTRAMUSCULAR | Status: AC
  Filled 2016-04-16: qty 30

## 2016-04-16 MED ORDER — DOCUSATE SODIUM 100 MG PO CAPS
100.0000 mg | ORAL_CAPSULE | Freq: Two times a day (BID) | ORAL | Status: DC
Start: 2016-04-16 — End: 2016-12-01

## 2016-04-16 MED ORDER — PROPOFOL 10 MG/ML IV BOLUS
INTRAVENOUS | Status: DC | PRN
  Administered 2016-04-16: 200 mg via INTRAVENOUS

## 2016-04-16 MED ORDER — ONDANSETRON HCL 4 MG/2ML IJ SOLN
INTRAMUSCULAR | Status: DC | PRN
  Administered 2016-04-16: 4 mg via INTRAVENOUS

## 2016-04-16 MED ORDER — LIDOCAINE 2% (20 MG/ML) 5 ML SYRINGE
INTRAMUSCULAR | Status: AC
  Filled 2016-04-16: qty 5

## 2016-04-16 MED ORDER — FENTANYL CITRATE (PF) 250 MCG/5ML IJ SOLN
INTRAMUSCULAR | Status: DC | PRN
  Administered 2016-04-16: 100 ug via INTRAVENOUS
  Administered 2016-04-16 (×4): 50 ug via INTRAVENOUS

## 2016-04-16 MED ORDER — BUPIVACAINE HCL (PF) 0.5 % IJ SOLN
30.0000 mL | Freq: Once | INTRAMUSCULAR | Status: AC
  Administered 2016-04-16: 30 mL
  Filled 2016-04-16: qty 30

## 2016-04-16 MED ORDER — OXYCODONE HCL 5 MG/5ML PO SOLN: 5.0000 mg | Freq: Once | ORAL | Status: AC | PRN

## 2016-04-16 MED ORDER — OXYCODONE HCL 5 MG PO TABS
5.0000 mg | ORAL_TABLET | Freq: Once | ORAL | Status: AC | PRN
  Administered 2016-04-16: 5 mg via ORAL

## 2016-04-16 MED ORDER — SUCCINYLCHOLINE CHLORIDE 200 MG/10ML IV SOSY
PREFILLED_SYRINGE | INTRAVENOUS | Status: AC
  Filled 2016-04-16: qty 10

## 2016-04-16 MED ORDER — GLYCOPYRROLATE 0.2 MG/ML IJ SOLN
INTRAMUSCULAR | Status: DC | PRN
  Administered 2016-04-16: 0.2 mg via INTRAVENOUS

## 2016-04-16 MED ORDER — EPHEDRINE 5 MG/ML INJ
INTRAVENOUS | Status: AC
  Filled 2016-04-16: qty 10

## 2016-04-16 MED ORDER — GLYCOPYRROLATE 0.2 MG/ML IV SOSY
PREFILLED_SYRINGE | INTRAVENOUS | Status: AC
  Filled 2016-04-16: qty 3

## 2016-04-16 MED ORDER — MEPERIDINE HCL 25 MG/ML IJ SOLN: 6.2500 mg | INTRAMUSCULAR | Status: DC | PRN

## 2016-04-16 MED ORDER — SUCCINYLCHOLINE CHLORIDE 20 MG/ML IJ SOLN
INTRAMUSCULAR | Status: DC | PRN
  Administered 2016-04-16: 120 mg via INTRAVENOUS

## 2016-04-16 MED ORDER — PROPOFOL 10 MG/ML IV BOLUS
INTRAVENOUS | Status: AC
  Filled 2016-04-16: qty 20

## 2016-04-16 MED ORDER — HYDROMORPHONE HCL 1 MG/ML IJ SOLN: 0.2500 mg | INTRAMUSCULAR | Status: DC | PRN

## 2016-04-16 MED ORDER — OXYCODONE HCL 5 MG PO TABS
ORAL_TABLET | ORAL | Status: AC
  Filled 2016-04-16: qty 1

## 2016-04-16 MED ORDER — EPHEDRINE SULFATE 50 MG/ML IJ SOLN
INTRAMUSCULAR | Status: DC | PRN
  Administered 2016-04-16: 10 mg via INTRAVENOUS

## 2016-04-16 SURGICAL SUPPLY — 46 items
BANDAGE ELASTIC 3 VELCRO ST LF (GAUZE/BANDAGES/DRESSINGS) IMPLANT
BANDAGE ELASTIC 4 VELCRO ST LF (GAUZE/BANDAGES/DRESSINGS) IMPLANT
BNDG COHESIVE 1X5 TAN STRL LF (GAUZE/BANDAGES/DRESSINGS) ×3 IMPLANT
BNDG CONFORM 2 STRL LF (GAUZE/BANDAGES/DRESSINGS) ×3 IMPLANT
BNDG ELASTIC 2X5.8 VLCR STR LF (GAUZE/BANDAGES/DRESSINGS) ×3 IMPLANT
BNDG GAUZE ELAST 4 BULKY (GAUZE/BANDAGES/DRESSINGS) ×3 IMPLANT
CORDS BIPOLAR (ELECTRODE) ×3 IMPLANT
COVER SURGICAL LIGHT HANDLE (MISCELLANEOUS) ×3 IMPLANT
CUFF TOURNIQUET SINGLE 18IN (TOURNIQUET CUFF) ×3 IMPLANT
CUFF TOURNIQUET SINGLE 24IN (TOURNIQUET CUFF) IMPLANT
DRAPE SURG 17X23 STRL (DRAPES) ×3 IMPLANT
DRSG ADAPTIC 3X8 NADH LF (GAUZE/BANDAGES/DRESSINGS) ×3 IMPLANT
GAUZE SPONGE 2X2 8PLY STRL LF (GAUZE/BANDAGES/DRESSINGS) IMPLANT
GAUZE SPONGE 4X4 12PLY STRL (GAUZE/BANDAGES/DRESSINGS) ×3 IMPLANT
GLOVE BIOGEL PI IND STRL 8.5 (GLOVE) ×1 IMPLANT
GLOVE BIOGEL PI INDICATOR 8.5 (GLOVE) ×2
GLOVE SURG ORTHO 8.0 STRL STRW (GLOVE) ×3 IMPLANT
GOWN STRL REUS W/ TWL LRG LVL3 (GOWN DISPOSABLE) ×2 IMPLANT
GOWN STRL REUS W/ TWL XL LVL3 (GOWN DISPOSABLE) ×1 IMPLANT
GOWN STRL REUS W/TWL LRG LVL3 (GOWN DISPOSABLE) ×4
GOWN STRL REUS W/TWL XL LVL3 (GOWN DISPOSABLE) ×2
KIT BASIN OR (CUSTOM PROCEDURE TRAY) ×3 IMPLANT
KIT ROOM TURNOVER OR (KITS) ×3 IMPLANT
MANIFOLD NEPTUNE II (INSTRUMENTS) ×3 IMPLANT
NEEDLE HYPO 25GX1X1/2 BEV (NEEDLE) IMPLANT
NS IRRIG 1000ML POUR BTL (IV SOLUTION) ×3 IMPLANT
PACK ORTHO EXTREMITY (CUSTOM PROCEDURE TRAY) ×3 IMPLANT
PAD ARMBOARD 7.5X6 YLW CONV (MISCELLANEOUS) ×6 IMPLANT
PAD CAST 4YDX4 CTTN HI CHSV (CAST SUPPLIES) IMPLANT
PADDING CAST COTTON 4X4 STRL (CAST SUPPLIES)
SOAP 2 % CHG 4 OZ (WOUND CARE) ×3 IMPLANT
SPECIMEN JAR SMALL (MISCELLANEOUS) ×3 IMPLANT
SPONGE GAUZE 2X2 STER 10/PKG (GAUZE/BANDAGES/DRESSINGS)
SUCTION FRAZIER HANDLE 10FR (MISCELLANEOUS)
SUCTION TUBE FRAZIER 10FR DISP (MISCELLANEOUS) IMPLANT
SUT MERSILENE 4 0 P 3 (SUTURE) IMPLANT
SUT PROLENE 4 0 PS 2 18 (SUTURE) IMPLANT
SUT VIC AB 4-0 P-3 18X BRD (SUTURE) ×1 IMPLANT
SUT VIC AB 4-0 P3 18 (SUTURE) ×2
SUT VICRYL RAPIDE 4/0 PS 2 (SUTURE) ×6 IMPLANT
SYR CONTROL 10ML LL (SYRINGE) IMPLANT
TOWEL OR 17X24 6PK STRL BLUE (TOWEL DISPOSABLE) ×3 IMPLANT
TOWEL OR 17X26 10 PK STRL BLUE (TOWEL DISPOSABLE) ×3 IMPLANT
TUBE CONNECTING 12'X1/4 (SUCTIONS)
TUBE CONNECTING 12X1/4 (SUCTIONS) IMPLANT
WATER STERILE IRR 1000ML POUR (IV SOLUTION) ×3 IMPLANT

## 2016-04-16 NOTE — Anesthesia Preprocedure Evaluation (Addendum)
Anesthesia Evaluation  Patient identified by MRN, date of birth, ID band  Reviewed: Allergy & Precautions, NPO status , Patient's Chart, lab work & pertinent test results  Airway Mallampati: III  TM Distance: >3 FB Neck ROM: Full    Dental  (+) Poor Dentition, Dental Advisory Given Poor dentition, mult loose teeth.  Explained increased risk of dental damage:   Pulmonary    breath sounds clear to auscultation       Cardiovascular hypertension, Pt. on medications  Rhythm:Regular Rate:Normal     Neuro/Psych    GI/Hepatic GERD  Medicated and Controlled,  Endo/Other  diabetes, Well Controlled, Oral Hypoglycemic Agents  Renal/GU      Musculoskeletal   Abdominal   Peds  Hematology   Anesthesia Other Findings   Reproductive/Obstetrics                            Anesthesia Physical Anesthesia Plan  ASA: III and emergent  Anesthesia Plan: General   Post-op Pain Management:    Induction: Intravenous, Rapid sequence and Cricoid pressure planned  Airway Management Planned: Oral ETT  Additional Equipment:   Intra-op Plan:   Post-operative Plan: Extubation in OR  Informed Consent: I have reviewed the patients History and Physical, chart, labs and discussed the procedure including the risks, benefits and alternatives for the proposed anesthesia with the patient or authorized representative who has indicated his/her understanding and acceptance.   Dental advisory given  Plan Discussed with: CRNA, Anesthesiologist and Surgeon  Anesthesia Plan Comments:         Anesthesia Quick Evaluation

## 2016-04-16 NOTE — H&P (Signed)
Manuel Reyes is an 65 y.o. male.   Chief Complaint: LEFT LONG AND RING FINGER INJURIES AFTER LAWNMOWER INJURY HPI: Manuel Reyes is a 65 y.o. male with history of hypertension, diabetes, gastric ulcers, depression, presents to emergency department complaining of left hand injury. Patient states he was cleaning a lawnmower when it suddenly turned on and cut his distal fourth and third fingers of the left hand. Patient states his tetanus is unknown. He reports his pain is 10 out of 10 at this time. No other injuries. Bleeding is controlled. Movement to palpation of the fingers makes pain worse, nothing makes it better.  Past Medical History  Diagnosis Date  . Hypertension   . Stomach ulcer   . Diabetes ([place])   . High cholesterol   . Depression     Past Surgical History  Procedure Laterality Date  . No past surgeries      Family History  Problem Relation Age of Onset  . Seizures Neg Hx   . Dementia Neg Hx    Social History:  reports that he has never smoked. He does not have any smokeless tobacco history on file. He reports that he drinks about 0.6 oz of alcohol per week. He reports that he does not use illicit drugs.  Allergies: No Known Allergies   (Not in a hospital admission)  Results for orders placed or performed during the hospital encounter of 04/16/16 (from the past 48 hour(s))  CBC with Differential     Status: Abnormal   Collection Time: 04/16/16  5:36 PM  Result Value Ref Range   WBC 5.4 4.0 - 10.5 K/uL   RBC 5.61 4.22 - 5.81 MIL/uL   Hemoglobin 16.6 13.0 - 17.0 g/dL   HCT 49.5 39.0 - 52.0 %   MCV 88.2 78.0 - 100.0 fL   MCH 29.6 26.0 - 34.0 pg   MCHC 33.5 30.0 - 36.0 g/dL   RDW 13.2 11.5 - 15.5 %   Platelets 141 (L) 150 - 400 K/uL   Neutrophils Relative % 68 %   Neutro Abs 3.7 1.7 - 7.7 K/uL   Lymphocytes Relative 18 %   Lymphs Abs 1.0 0.7 - 4.0 K/uL   Monocytes Relative 12 %   Monocytes Absolute 0.7 0.1 - 1.0 K/uL   Eosinophils Relative 1 %    Eosinophils Absolute 0.0 0.0 - 0.7 K/uL   Basophils Relative 1 %   Basophils Absolute 0.0 0.0 - 0.1 K/uL  I-Stat Chem 8, ED     Status: Abnormal   Collection Time: 04/16/16  5:47 PM  Result Value Ref Range   Sodium 144 135 - 145 mmol/L   Potassium 4.0 3.5 - 5.1 mmol/L   Chloride 103 101 - 111 mmol/L   BUN 11 6 - 20 mg/dL   Creatinine, Ser 0.80 0.61 - 1.24 mg/dL   Glucose, Bld 93 65 - 99 mg/dL   Calcium, Ion 1.20 1.13 - 1.30 mmol/L   TCO2 29 0 - 100 mmol/L   Hemoglobin 17.7 (H) 13.0 - 17.0 g/dL   HCT 52.0 39.0 - 52.0 %   Dg Hand Complete Left  04/16/2016  CLINICAL DATA:  Amputation of the left ring and long fingers while working on a long lower today. Initial encounter. EXAM: LEFT HAND - COMPLETE 3+ VIEW COMPARISON:  None. FINDINGS: The ring and long fingers are amputated at the level of the bases of the distal phalanges of both fingers. Associated soft tissue wounds are identified. No radiopaque foreign  body is identified. IMPRESSION: Amputations at the bases of the distal phalanges of the long and ring fingers of the left hand. Electronically Signed   By: Inge Rise M.D.   On: 04/16/2016 17:23    ROS NO RECENT ILLNESSES OR HOSPITALIZATIONS  Blood pressure 142/102, pulse 70, temperature 98.6 F (37 C), resp. rate 16, SpO2 98 %. Physical Exam  General Appearance:  Alert, cooperative, no distress, appears stated age  Head:  Normocephalic, without obvious abnormality, atraumatic  Eyes:  Pupils equal, conjunctiva/corneas clear,         Throat: Lips, mucosa, and tongue normal; teeth and gums normal  Neck: No visible masses     Lungs:   respirations unlabored  Chest Wall:  No tenderness or deformity  Heart:  Regular rate and rhythm,  Abdomen:   Soft, non-tender,         Extremities: LEFT HAND: AMPUTATIONS TO LONG AND RING FINGERS THROUGH LEVEL OF DISTAL PHALANGES, UNABLE TO FLEX DIP JOINTS ABLE TO FLEX PIP JOINTS TO LONG AND RING GOOD MOBILITY TO INDEX/SMALL AND THUMB   Pulses: 2+ and symmetric  Skin: Skin color, texture, turgor normal, no rashes or lesions     Neurologic: Normal   Assessment/Plan LEFT LONG AND RING FINGER DISTAL TIP AMPUTATIONS  LEFT LONG AND RING FINGER REVISION AMPUTATIONS AND LOCAL NEURECTOMIES  R/B/A DISCUSSED WITH PT IN HOLDING AREA.  PT VOICED UNDERSTANDING OF PLAN CONSENT SIGNED DAY OF SURGERY PT SEEN AND EXAMINED PRIOR TO OPERATIVE PROCEDURE/DAY OF SURGERY SITE MARKED. QUESTIONS ANSWERED WILL GO HOME FOLLOWING SURGERY  WE ARE PLANNING SURGERY FOR YOUR UPPER EXTREMITY. THE RISKS AND BENEFITS OF SURGERY INCLUDE BUT NOT LIMITED TO BLEEDING INFECTION, DAMAGE TO NEARBY NERVES ARTERIES TENDONS, FAILURE OF SURGERY TO ACCOMPLISH ITS INTENDED GOALS, PERSISTENT SYMPTOMS AND NEED FOR FURTHER SURGICAL INTERVENTION. WITH THIS IN MIND WE WILL PROCEED. I HAVE DISCUSSED WITH THE PATIENT THE PRE AND POSTOPERATIVE REGIMEN AND THE DOS AND DON'TS. PT VOICED UNDERSTANDING AND INFORMED CONSENT SIGNED. Linna Hoff 04/16/2016, 6:17 PM

## 2016-04-16 NOTE — Discharge Instructions (Signed)
KEEP BANDAGE CLEAN AND DRY °CALL OFFICE FOR F/U APPT [phone] IN 10 DAYS °KEEP HAND ELEVATED ABOVE HEART °OK TO APPLY ICE TO OPERATIVE AREA °CONTACT OFFICE IF ANY WORSENING PAIN OR CONCERNS. °

## 2016-04-16 NOTE — Anesthesia Procedure Notes (Signed)
Procedure Name: Intubation Date/Time: 04/16/2016 6:28 PM Performed by: Mosie Epstein Pre-anesthesia Checklist: Patient identified, Timeout performed, Emergency Drugs available, Suction available and Patient being monitored Patient Re-evaluated:Patient Re-evaluated prior to inductionOxygen Delivery Method: Circle system utilized Preoxygenation: Pre-oxygenation with 100% oxygen Intubation Type: IV induction Ventilation: Mask ventilation without difficulty Laryngoscope Size: Mac and 3 Grade View: Grade I Tube type: Oral Tube size: 7.5 mm Number of attempts: 1 Airway Equipment and Method: Stylet Placement Confirmation: ETT inserted through vocal cords under direct vision,  positive ETCO2 and breath sounds checked- equal and bilateral Secured at: 22 cm Tube secured with: Tape Dental Injury: Teeth and Oropharynx as per pre-operative assessment

## 2016-04-16 NOTE — Brief Op Note (Signed)
04/16/2016  6:19 PM  PATIENT:  Manuel Reyes  66 y.o. 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   Delay start of Pharmacological VTE agent (>24hrs) due to surgical blood loss or risk of bleeding: not applicable

## 2016-04-16 NOTE — ED Provider Notes (Signed)
CSN: [id number]     Arrival date & time 04/16/16  1611 History   First MD Initiated Contact with Patient 04/16/16 1621     Chief Complaint  Patient presents with  . finger amputation      (Consider location/radiation/quality/duration/timing/severity/associated sxs/prior Treatment) HPI Manuel Reyes is a 65 y.o. male with history of hypertension, diabetes, gastric ulcers, depression, presents to emergency department complaining of left hand injury. Patient states he was cleaning a lawnmower when it suddenly turned on and cut his distal fourth and third fingers of the left hand. Patient states his tetanus is unknown. He reports his pain is 10 out of 10 at this time. No other injuries. Bleeding is controlled. Movement to palpation of the fingers makes pain worse, nothing makes it better.  Past Medical History  Diagnosis Date  . Hypertension   . Stomach ulcer   . Diabetes ([place])   . High cholesterol   . Depression    Past Surgical History  Procedure Laterality Date  . No past surgeries     Family History  Problem Relation Age of Onset  . Seizures Neg Hx   . Dementia Neg Hx    Social History  Substance Use Topics  . Smoking status: Never Smoker   . Smokeless tobacco: None  . Alcohol Use: 0.6 oz/week    1 Cans of beer per week     Comment: Beer ocass    Review of Systems  Constitutional: Negative for fever and chills.  Respiratory: Negative for cough, chest tightness and shortness of breath.   Cardiovascular: Negative for chest pain, palpitations and leg swelling.  Musculoskeletal: Positive for arthralgias. Negative for myalgias, neck pain and neck stiffness.  Skin: Positive for wound. Negative for rash.  Allergic/Immunologic: Negative for immunocompromised state.  All other systems reviewed and are negative.     Allergies  Review of patient's allergies indicates no known allergies.  Home Medications   Prior to Admission medications   Medication Sig Start Date End  Date Taking? Authorizing Provider  amLODipine (NORVASC) 10 MG tablet Take 1 tablet (10 mg total) by mouth daily. 10/28/15   Mariel Aloe, MD  diclofenac (VOLTAREN) 75 MG EC tablet Take 1 tablet (75 mg total) by mouth 2 (two) times daily as needed. 01/14/16   Ashly Windell Moulding, DO  levETIRAcetam (KEPPRA) 500 MG tablet Take 1 tablet (500 mg total) by mouth 2 (two) times daily. 01/21/16   Melvenia Beam, MD  omeprazole (PRILOSEC) 20 MG capsule Take 20 mg by mouth daily.    Historical Provider, MD  rosuvastatin (CRESTOR) 20 MG tablet Take 1 tablet (20 mg total) by mouth daily. 04/15/15   Mariel Aloe, MD  venlafaxine XR (EFFEXOR XR) 37.5 MG 24 hr capsule Take 1 capsule (37.5 mg total) by mouth daily with breakfast. 04/15/15   Mariel Aloe, MD   BP 164/116 mmHg  Pulse 78  Temp(Src) 98.6 F (37 C)  Resp 20  SpO2 98% Physical Exam  Constitutional: He is oriented to person, place, and time. He appears well-developed and well-nourished. No distress.  HENT:  Head: Normocephalic and atraumatic.  Eyes: Conjunctivae are normal.  Neck: Neck supple.  Cardiovascular: Normal rate, regular rhythm and normal heart sounds.   Pulmonary/Chest: Effort normal. No respiratory distress. He has no wheezes. He has no rales.  Musculoskeletal: He exhibits no edema.  Complete amputation of the left third and fourth digits, just distal to DIP joint. Hemostatic at this time. Full range  of motion of all fingers at all joints otherwise.  Neurological: He is alert and oriented to person, place, and time.  Skin: Skin is warm and dry.  Nursing note and vitals reviewed.   ED Course  Procedures (including critical care time) Labs Review Labs Reviewed - No data to display  Imaging Review No results found. I have personally reviewed and evaluated these images and lab results as part of my medical decision-making.   EKG Interpretation None      NERVE BLOCK Performed by: Jeannett Senior A Consent: Verbal  consent obtained. Required items: required blood products, implants, devices, and special equipment available Time out: Immediately prior to procedure a "time out" was called to verify the correct patient, procedure, equipment, support staff and site/side marked as required.  Indication: finger amputation Nerve block body site: left 3rd digit  Preparation: Patient was prepped and draped in the usual sterile fashion. Needle gauge: 24 G Location technique: anatomical landmarks  Local anesthetic: bupivacaine 0.5%wo epi  Anesthetic total: 4 ml  Outcome: pain improved Patient tolerance: Patient tolerated the procedure well with no immediate complications.  NERVE BLOCK Performed by: Jeannett Senior A Consent: Verbal consent obtained. Required items: required blood products, implants, devices, and special equipment available Time out: Immediately prior to procedure a "time out" was called to verify the correct patient, procedure, equipment, support staff and site/side marked as required.  Indication: finger amputation  Nerve block body site: left 4th digit  Preparation: Patient was prepped and draped in the usual sterile fashion. Needle gauge: 25 G Location technique: anatomical landmarks  Local anesthetic: bupivacaine 0.5% wo epi  Anesthetic total: 4 ml  Outcome: pain improved Patient tolerance: Patient tolerated the procedure well with no immediate complications.   MDM   Final diagnoses:  Amputation of third finger, left, initial encounter  Amputation of fourth finger of left hand, initial encounter    Patient with amputation of the distal left third and fourth digits, hemostatic, digital block performed due to severe pain. X-ray ordered. IV antibiotics started. Tetanus updated. I spoke with Dr. Apolonio Schneiders with hand surgery who asked to be called back upon completion of the x-ray. Pt is pain free at this time.   Discussed results with Dr. Apolonio Schneiders, he will take patient to  surgery. I discussed plan with patient. Interpreter was used during entire encounter.  Filed Vitals:   04/16/16 1939 04/16/16 1945 04/16/16 2000 04/16/16 2015  BP: 162/113 157/106 150/108 150/108  Pulse: 86 85 76 76  Temp: 97.4 F (36.3 C)   97.3 F (36.3 C)  Resp: 18 18 18 18   SpO2: 99% 94% 97% 93%       Jeannett Senior, PA-C 04/16/16 2304  Forde Dandy, MD 04/17/16 0041

## 2016-04-16 NOTE — Anesthesia Postprocedure Evaluation (Signed)
Anesthesia Post Note  Patient: Manuel Reyes  Procedure(s) Performed: Procedure(s) (LRB): AMPUTATION DIGIT (Left)  Patient location during evaluation: PACU Anesthesia Type: General Level of consciousness: awake and alert Pain management: pain level controlled Vital Signs Assessment: post-procedure vital signs reviewed and stable Respiratory status: spontaneous breathing, nonlabored ventilation and respiratory function stable Cardiovascular status: blood pressure returned to baseline and stable Postop Assessment: no signs of nausea or vomiting Anesthetic complications: no    Last Vitals:  Filed Vitals:   04/16/16 1939 04/16/16 1945  BP: 162/113 157/106  Pulse: 86 85  Temp: 36.3 C   Resp: 18 18    Last Pain:  Filed Vitals:   04/16/16 1953  PainSc: 3                  Ramello Cordial A

## 2016-04-16 NOTE — Transfer of Care (Signed)
Immediate Anesthesia Transfer of Care Note  Patient: Manuel Reyes  Procedure(s) Performed: Procedure(s): AMPUTATION DIGIT (Left)  Patient Location: PACU  Anesthesia Type:General  Level of Consciousness: awake  Airway & Oxygen Therapy: Patient Spontanous Breathing and Patient connected to face mask oxygen  Post-op Assessment: Report given to RN and Post -op Vital signs reviewed and stable  Post vital signs: Reviewed and stable  Last Vitals:  Filed Vitals:   04/16/16 1625 04/16/16 1741  BP: 164/116 142/102  Pulse: 78 70  Temp: 37 C   Resp: 20 16    Last Pain:  Filed Vitals:   04/16/16 1742  PainSc: 10-Worst pain ever         Complications: No apparent anesthesia complications

## 2016-04-16 NOTE — ED Notes (Signed)
Patient here with left hand injury after cutting the tips off both middle and ring finger this evening on lawn mower blade. Appears exposed bone to same

## 2016-04-16 NOTE — Op Note (Signed)
NAMERASON, PASLEY                ACCOUNT NO.:  [id number]  MEDICAL RECORD NO.:  [id number]  LOCATION:  [facility]                         FACILITY:  [facility]  PHYSICIAN:  Linna Hoff IV, M.D.DATE OF BIRTH:  05-Dec-1950  DATE OF PROCEDURE:  04/16/2016 DATE OF DISCHARGE:                              OPERATIVE REPORT   PREOPERATIVE DIAGNOSES: 1. Left long finger open distal phalanx fracture. 2. Left ring finger open distal phalanx fracture.  POSTOPERATIVE DIAGNOSES: 1. Left long finger open distal phalanx fracture. 2. Left ring finger open distal phalanx fracture.  ATTENDING SURGEON:  Linna Hoff, M.D., who scrubbed and present for the entire procedure.  ASSISTANT SURGEON:  None.  ANESTHESIA:  General via LMA.  SURGICAL PROCEDURE: 1. Excisional debridement of skin and subcutaneous tissue and bone of     the left long finger. 2. Left long finger revision amputation with local neurectomies and     advancement of flap closure. 3. Left ring finger excisional debridement of skin and subcutaneous     tissue, and bone associated with open fracture. 4. Left ring finger revision amputation with local neurectomies and     advancement of the flap closure.  SURGICAL INDICATIONS:  Mr. Lingg is a right-hand-dominant gentleman who sustained the injury to the left long and ring fingers after a lawn mower injury.  The patient was seen and evaluated and recommended to undergo the above procedure.  Risks, benefits, and alternatives were discussed in detail with the patient.  Signed informed consent was obtained.  Risks include, but not limited to bleeding, infection, and damage nearby nerves, arteries, or tendons; loss of motion of wrist and digits, incomplete relief of symptoms, and need for further surgical intervention.  DESCRIPTION OF PROCEDURE:  The patient was properly identified in the preoperative holding area and marked with a permanent marker and left long and ring finger to  indicate correct operative site.  The patient was then brought back to the operating room, placed supine on the anesthesia table where general anesthesia was administered.  The patient tolerated this well.  A well-padded tourniquet placed on left brachium and sealed with 1000 drape.  The left upper extremity was then prepped and draped in normal sterile fashion.  Time-out was called.  Correct site was identified and the procedure then begun.  Attention then turned to left long finger.  The limb was then elevated and tourniquet insufflated.  Excisional debridement of skin and subcutaneous tissue, and bone was then carried out.  This was carried out all the way down to the bone, removing it all the way amputating at the level of the distal phalanx.  Local neurectomies were then carried out of the radial ulnar digital nerves.  The FDP was allowed to retract proximally.  After excision and debridement sharply with a knife and sharp scissors and rongeur, V-Y advancement flap was then created volarly and the flap was then elevated and advanced dorsally.  The wound was then irrigated and then closed with 4-0 Vicryl Rapide suture.  Attention was then turned to the ring finger where similar technique was used.  Excisional debridement was carried out of the skin and subcutaneous tissue all the  way down to the bone removing the devitalized tissue.  The wound was then thoroughly irrigated.  After excisional debridement of skin and subcutaneous tissue, attention was then turned to local neurectomies when was then carried out and done sharply.  The FDP was allowed to advance in the amputation part and bone was removed all the way down to the level of the distal interphalangeal joint.  Wound was then thoroughly irrigated.  V-Y advancement flap was then created and the skin was then closed using simple 4-0 Vicryl Rapide suture.  Adaptic dressing, sterile compressive bandage then applied.  The patient  was placed in a small finger dressing, 8 mL of 0.25% of Marcaine infiltrated between the two fingers.  Sterile compressive bandage was then applied. The patient tolerated the procedure well, returned to recovery room in good condition.  POSTPROCEDURE PLAN:  The patient will be discharged home, seen back in the office in approximately 10 days for wound check, and then a small dressings until the sutures come out in approximately 3-4 weeks. Gradual use and activity of the PIP joint at the 1st postop visit.  Radiographs of the first visit.     Melrose Nakayama, M.D.     FWO/MEDQ  D:  04/16/2016  T:  04/16/2016  Job:  [id number]

## 2016-04-17 ENCOUNTER — Encounter (HOSPITAL_COMMUNITY): Payer: Self-pay | Admitting: Orthopedic Surgery

## 2016-04-27 ENCOUNTER — Encounter (HOSPITAL_COMMUNITY): Payer: Self-pay | Admitting: *Deleted

## 2016-04-27 ENCOUNTER — Emergency Department (HOSPITAL_COMMUNITY)
Admission: EM | Admit: 2016-04-27 | Discharge: 2016-04-27 | Disposition: A | Discharge status: Home / Self Care | Payer: Medicaid Other | Attending: Emergency Medicine | Admitting: Emergency Medicine

## 2016-04-27 DIAGNOSIS — Z4800 Encounter for change or removal of nonsurgical wound dressing: Secondary | ICD-10-CM | POA: Insufficient documentation

## 2016-04-27 DIAGNOSIS — I10 Essential (primary) hypertension: Secondary | ICD-10-CM | POA: Diagnosis not present

## 2016-04-27 DIAGNOSIS — E119 Type 2 diabetes mellitus without complications: Secondary | ICD-10-CM | POA: Diagnosis not present

## 2016-04-27 DIAGNOSIS — Z5189 Encounter for other specified aftercare: Secondary | ICD-10-CM

## 2016-04-27 NOTE — ED Notes (Signed)
New dressing applied with non-adherent gauze and sterile gauze with ace wrap.

## 2016-04-27 NOTE — ED Notes (Signed)
Pt states that he was hear last week because he cut the tips of his fingers off. States that he was told to come back in 10 days for a wound check.

## 2016-04-27 NOTE — ED Provider Notes (Signed)
CSN: [id number]     Arrival date & time 04/27/16  1723 History  By signing my name below, I, Rayna Sexton, attest that this documentation has been prepared under the direction and in the presence of Waynetta Pean, PA-C. Electronically Signed: Rayna Sexton, ED Scribe. 04/27/2016. 6:19 PM.   Chief Complaint  Patient presents with  . Wound Check   The history is provided by the patient and a relative. A language interpreter was used.   HPI Comments: Manuel Reyes is a 65 y.o. male who presents to the Emergency Department for a wound check. 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 Following surgery with Dr. Apolonio Schneiders pt was d/c with colace, keflex and oxycodone and instructed to follow up with Dr. Apolonio Schneiders in 10 days. His relative states that they had an appointment with Dr. Apolonio Schneiders at 5:45 PM but could not find his office so they came to the ED for evaluation. Pt reports mild pain surrounding the wounds that worsens with movement or palpation. He denies any fevers, chills or discharge from the wounds.   Past Medical History  Diagnosis Date  . Hypertension   . Stomach ulcer   . Diabetes ([place])   . High cholesterol   . Depression    Past Surgical History  Procedure Laterality Date  . No past surgeries    . Amputation Left 04/16/2016    Procedure: AMPUTATION DIGIT;  Surgeon: Iran Planas, MD;  Location: [place];  Service: Orthopedics;  Laterality: Left;   Family History  Problem Relation Age of Onset  . Seizures Neg Hx   . Dementia Neg Hx    Social History  Substance Use Topics  . Smoking status: Never Smoker   . Smokeless tobacco: None  . Alcohol Use: 0.6 oz/week    1 Cans of beer per week     Comment: Beer ocass    Review of Systems  Constitutional: Negative for fever and chills.  Musculoskeletal: Negative for joint swelling.  Skin: Positive for wound.  Negative for color change.  Neurological: Negative for weakness and numbness.    Allergies  Review of patient's allergies indicates no known allergies.  Home Medications   Prior to Admission medications   Medication Sig Start Date End Date Taking? Authorizing Provider  amLODipine (NORVASC) 10 MG tablet Take 1 tablet (10 mg total) by mouth daily. 10/28/15   Mariel Aloe, MD  cephALEXin (KEFLEX) 500 MG capsule Take 1 capsule (500 mg total) by mouth 4 (four) times daily. 04/16/16   Iran Planas, MD  diclofenac (VOLTAREN) 75 MG EC tablet Take 1 tablet (75 mg total) by mouth 2 (two) times daily as needed. 01/14/16   Ashly Windell Moulding, DO  docusate sodium (COLACE) 100 MG capsule Take 1 capsule (100 mg total) by mouth 2 (two) times daily. 04/16/16   Iran Planas, MD  levETIRAcetam (KEPPRA) 500 MG tablet Take 1 tablet (500 mg total) by mouth 2 (two) times daily. 01/21/16   Melvenia Beam, MD  omeprazole (PRILOSEC) 20 MG capsule Take 20 mg by mouth daily.    Historical Provider, MD  oxyCODONE-acetaminophen (ROXICET) 5-325 MG tablet Take 1 tablet by mouth every 4 (four) hours as needed for severe pain. 04/16/16   Iran Planas, MD  rosuvastatin (CRESTOR) 20 MG tablet Take 1 tablet (20 mg total) by mouth daily. 04/15/15   Evalee Jefferson  Lonny Prude, MD  venlafaxine XR (EFFEXOR XR) 37.5 MG 24 hr capsule Take 1 capsule (37.5 mg total) by mouth daily with breakfast. 04/15/15   Mariel Aloe, MD   BP 144/113 mmHg  Pulse 80  Temp(Src) 98.3 F (36.8 C) (Oral)  Resp 18  Ht 5\' 7"  (1.702 m)  Wt 63.141 kg  BMI 21.80 kg/m2  SpO2 99%    Physical Exam  Constitutional: He appears well-developed and well-nourished. No distress.  HENT:  Head: Normocephalic and atraumatic.  Eyes: Right eye exhibits no discharge. Left eye exhibits no discharge.  Cardiovascular: Normal rate, regular rhythm and intact distal pulses.   Pulses:      Radial pulses are 2+ on the right side, and 2+ on the left side.  Bilateral radial pulses  are intact.  Pulmonary/Chest: Effort normal. No respiratory distress.  Neurological: He is alert. Coordination normal.  Skin: Skin is warm and dry. No rash noted. He is not diaphoretic. No erythema. No pallor.  Well healing amputations of the left 3rd and 4th fingers; no discharge; good radial pulses bilaterally; good cap refill  Psychiatric: He has a normal mood and affect. His behavior is normal.  Nursing note and vitals reviewed.   ED Course  Procedures  DIAGNOSTIC STUDIES: Oxygen Saturation is 99% on RA, normal by my interpretation.    COORDINATION OF CARE: 6:16 PM Discussed next steps with pt and his relative. They verbalized understanding and are agreeable with the plan.   Labs Review Labs Reviewed - No data to display  Imaging Review No results found.   EKG Interpretation None      Filed Vitals:   04/27/16 1731  BP: 144/113  Pulse: 80  Temp: 98.3 F (36.8 C)  TempSrc: Oral  Resp: 18  Height: 5\' 7"  (1.702 m)  Weight: 63.141 kg  SpO2: 99%     MDM   Meds given in ED:  Medications - No data to display  New Prescriptions   No medications on file    Final diagnoses:  Encounter for wound re-check    This  is a 65 y.o. male who presents to the Emergency Department for a wound check. 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 Following surgery with Dr. Apolonio Schneiders pt was d/c with colace, keflex and oxycodone and instructed to follow up with Dr. Apolonio Schneiders in 10 days. His relative states that they had an appointment with Dr. Apolonio Schneiders at 5:45 PM but could not find his office so they came to the ED for evaluation. Pt reports mild pain surrounding the wounds that worsens with movement or palpation. He denies any fevers, chills or discharge from the wounds. On exam patient is afebrile nontoxic appearing. His wounds appear to be well healing. No sign of  infection or discharge. His wounds were redressed in the emergency department by RN.  I called [facility] and got him a follow up appointment for tomorrow morning at 7:30 am which the patient states that he can make it to. I discussed where his office was and how to get there. Provided him with discharge instructions with the phone number and has equal address of his office.  I advised return to the emergency department with new or worsening symptoms or new concerns. The patient and the patient's family member verbalized understanding and agreement with plan.  I personally performed the services described  in this documentation, which was scribed in my presence. The recorded information has been reviewed and is accurate.       Waynetta Pean, PA-C 04/27/16 [place], MD 05/01/16 2202

## 2016-04-27 NOTE — Discharge Instructions (Signed)

## 2016-05-09 ENCOUNTER — Ambulatory Visit (INDEPENDENT_AMBULATORY_CARE_PROVIDER_SITE_OTHER): Payer: Medicaid Other | Admitting: Family Medicine

## 2016-05-09 ENCOUNTER — Encounter: Payer: Self-pay | Admitting: Family Medicine

## 2016-05-09 VITALS — BP 155/97 | HR 73 | Temp 98.3°F | Ht 64.0 in | Wt 142.8 lb

## 2016-05-09 DIAGNOSIS — I10 Essential (primary) hypertension: Secondary | ICD-10-CM | POA: Diagnosis present

## 2016-05-09 DIAGNOSIS — K2100 Gastro-esophageal reflux disease with esophagitis, without bleeding: Secondary | ICD-10-CM

## 2016-05-09 DIAGNOSIS — L853 Xerosis cutis: Secondary | ICD-10-CM | POA: Insufficient documentation

## 2016-05-09 DIAGNOSIS — E785 Hyperlipidemia, unspecified: Secondary | ICD-10-CM

## 2016-05-09 DIAGNOSIS — F411 Generalized anxiety disorder: Secondary | ICD-10-CM

## 2016-05-09 DIAGNOSIS — S68119A Complete traumatic metacarpophalangeal amputation of unspecified finger, initial encounter: Secondary | ICD-10-CM | POA: Insufficient documentation

## 2016-05-09 DIAGNOSIS — S68119D Complete traumatic metacarpophalangeal amputation of unspecified finger, subsequent encounter: Secondary | ICD-10-CM

## 2016-05-09 HISTORY — DX: Complete traumatic metacarpophalangeal amputation of unspecified finger, initial encounter: S68.119A

## 2016-05-09 MED ORDER — VENLAFAXINE HCL ER 37.5 MG PO CP24: 37.5000 mg | ORAL_CAPSULE | Freq: Every day | ORAL | Status: DC

## 2016-05-09 MED ORDER — OMEPRAZOLE 20 MG PO CPDR: 20.0000 mg | DELAYED_RELEASE_CAPSULE | Freq: Every day | ORAL | Status: DC

## 2016-05-09 MED ORDER — LISINOPRIL-HYDROCHLOROTHIAZIDE 20-25 MG PO TABS: 1.0000 | ORAL_TABLET | Freq: Every day | ORAL | Status: DC

## 2016-05-09 MED ORDER — AMLODIPINE BESYLATE 10 MG PO TABS: 10.0000 mg | ORAL_TABLET | Freq: Every day | ORAL | Status: DC

## 2016-05-09 MED ORDER — ROSUVASTATIN CALCIUM 20 MG PO TABS: 20.0000 mg | ORAL_TABLET | Freq: Every day | ORAL | Status: DC

## 2016-05-09 NOTE — Assessment & Plan Note (Signed)
Patient notes itchiness and was wondering if this was a rash.  Looks more like dry skin.  No wheels, excoriations or other lesions.  -recommended patient try baby oil to see if improves.  Can f/u on this issue in one month during BP f/u.

## 2016-05-09 NOTE — Assessment & Plan Note (Signed)
Blood pressure today was 155/97, goal BP is 140/90 and he has been pretty consistent in the high 150s-160s. Only on amlodipine 10mg  and states that he is compliant with medication. - will start patient on lisinopril/HCTZ 20-25 and f/u in one month to assess.

## 2016-05-09 NOTE — Progress Notes (Signed)
    Subjective:  Manuel Reyes is a 65 y.o. male who presents to the [facility] today for a follow up for blood pressure and a rash  HPI:  Mr. Royle states that his arms and back have been itchy for the past month or so.  He denies changing any detergents, but did switch to a bar soap fairly recently.  He does not have any pets and denies having bed begs.  States that his arms are itchy and dry and wonders if this is an allergic reaction.  Does not have any known allergies. No edema, shortness of breath, CP, nausea vomiting diarrhea.    ROS:  Per HPI  Patient is a non-smoker  Objective:  Physical Exam: BP 155/97 mmHg  Pulse 73  Temp(Src) 98.3 F (36.8 C) (Oral)  Ht 5\' 4"  (1.626 m)  Wt 142 lb 12.8 oz (64.774 kg)  BMI 24.50 kg/m2  Gen: Well appearing nepalese speaking gentleman, NAD, resting comfortably CV: RRR with no murmurs appreciated Pulm: NWOB, CTAB with no crackles, wheezes, or rhonchi GI: Normal bowel sounds present. Soft, Nontender, Nondistended. MSK: no edema, cyanosis, or clubbing noted Skin: warm and dry. Forearms dry and red.  No excoriations, wheels or other lesions.  Neuro: grossly normal, moves all extremities Psych: Normal affect and thought content    Assessment/Plan:  Essential hypertension Blood pressure today was 155/97, goal BP is 140/90 and he has been pretty consistent in the high 150s-160s. Only on amlodipine 10mg  and states that he is compliant with medication. - will start patient on lisinopril/HCTZ 20-25 and f/u in one month to assess.   Dry skin Patient notes itchiness and was wondering if this was a rash.  Looks more like dry skin.  No wheels, excoriations or other lesions.  -recommended patient try baby oil to see if improves.  Can f/u on this issue in one month during BP f/u.

## 2016-05-09 NOTE — Patient Instructions (Signed)
Manuel Reyes, it was a pleasure meeting you today.  Thank you for coming in to see me.  Today we have discussed your itchiness on your arms and back, which I believe to be due to dry skin.  This could be because you switched to a new soap fairly recently.  Also, as we age we unfortunately lose some of the natural oils in our skin and this can become dry and itchy.  I recommend trying to see if baby oil will help you with your dryness.  Also, we discussed your blood pressure and I agree with you that we need to try to add another medication.  I have placed you on a combination medication: lisinopril/HCTZ, which I think will help with your pressures.  I recommend that you get weekly blood pressures at the pharmacy, if possible and I would like to see you back in one month for a follow up on your blood pressure.   It was great to see you and I look forward to seeing you again.   Take care, Rosana Berger, M.D.

## 2016-05-18 ENCOUNTER — Ambulatory Visit: Payer: Medicaid Other | Admitting: Neurology

## 2016-06-13 ENCOUNTER — Ambulatory Visit (INDEPENDENT_AMBULATORY_CARE_PROVIDER_SITE_OTHER): Payer: Medicaid Other | Admitting: Neurology

## 2016-06-13 ENCOUNTER — Encounter: Payer: Self-pay | Admitting: Neurology

## 2016-06-13 VITALS — BP 120/80 | HR 68 | Ht 64.0 in | Wt 143.0 lb

## 2016-06-13 DIAGNOSIS — F0390 Unspecified dementia without behavioral disturbance: Secondary | ICD-10-CM | POA: Insufficient documentation

## 2016-06-13 DIAGNOSIS — R419 Unspecified symptoms and signs involving cognitive functions and awareness: Secondary | ICD-10-CM

## 2016-06-13 MED ORDER — DONEPEZIL HCL 10 MG PO TABS: ORAL_TABLET | ORAL | 12 refills | Status: DC

## 2016-06-13 NOTE — Patient Instructions (Signed)
Remember to drink plenty of fluid, eat healthy meals and do not skip any meals. Try to eat protein with a every meal and eat a healthy snack such as fruit or nuts in between meals. Try to keep a regular sleep-wake schedule and try to exercise daily, particularly in the form of walking, 20-30 minutes a day, if you can.   As far as your medications are concerned, I would like to suggest Start Aricept 1/2 tablet for one week and then increase to a whole tablet daily if no side effects Continue Keppra  I would like to see you back in 6-9 months, sooner if we need to. Please call us with any interim questions, concerns, problems, updates or refill requests.   Our phone number is [phone]. We also have an after hours call service for urgent matters and there is a physician on-call for urgent questions. For any emergencies you know to call 911 or go to the nearest emergency room

## 2016-06-13 NOTE — Addendum Note (Signed)
Addended by: Edison Pace, Maeven Mcdougall L on: 06/13/2016 12:00 PM   Modules accepted: Orders
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
## 2016-06-26 ENCOUNTER — Telehealth: Payer: Self-pay | Admitting: Neurology

## 2016-06-26 NOTE — Telephone Encounter (Signed)
Manuel Reyes [facility] [id number] called to advise, patient is non admit for them for Nursing and Social Work. Son is requesting in home [facility], states this is a service they don't provide. Also Medicaid Patient, Manuel Reyes has referred back to [facility].

## 2016-06-26 NOTE — Telephone Encounter (Signed)
Returned call to Manuel Reyes at [facility] - states the patient's son would like nursing care in the home for medication administration and ADLS, longterm.  Manuel Reyes is unable to provide this service.  She has referred him to [facility] [employer] since he has Medicaid.  She said they would assign him a Education officer, museum who could help.  Notified Dr. Jaynee Eagles of status.

## 2016-06-27 ENCOUNTER — Other Ambulatory Visit: Payer: Self-pay | Admitting: Family Medicine

## 2016-06-27 DIAGNOSIS — I10 Essential (primary) hypertension: Secondary | ICD-10-CM

## 2016-06-27 MED ORDER — LISINOPRIL-HYDROCHLOROTHIAZIDE 20-25 MG PO TABS: 1.0000 | ORAL_TABLET | Freq: Every day | ORAL | 3 refills | Status: DC

## 2016-06-27 MED ORDER — AMLODIPINE BESYLATE 10 MG PO TABS: 10.0000 mg | ORAL_TABLET | Freq: Every day | ORAL | 3 refills | Status: DC

## 2016-06-27 NOTE — Telephone Encounter (Signed)
Needs refill on his pressure medications.  Does not the names of the meds. [facility] [address]

## 2016-07-14 ENCOUNTER — Ambulatory Visit: Payer: Medicaid Other | Admitting: Family Medicine

## 2016-07-14 NOTE — Progress Notes (Deleted)
  Subjective:  Manuel Reyes is a 65 y.o. male who presents to the [facility] today with a chief complaint of depression  HPI:   ***HIST  Objective:  Physical Exam: There were no vitals taken for this visit.  Gen: ***NAD, resting comfortably CV: RRR with no murmurs appreciated Pulm: NWOB, CTAB with no crackles, wheezes, or rhonchi GI: Normal bowel sounds present. Soft, Nontender, Nondistended. MSK: no edema, cyanosis, or clubbing noted Skin: warm, dry Neuro: grossly normal, moves all extremities Psych: Normal affect and thought content  No results found for this or any previous visit (from the past 72 hour(s)).   Assessment/Plan:  No problem-specific Assessment & Plan notes found for this encounter.

## 2016-07-28 ENCOUNTER — Telehealth: Payer: Self-pay | Admitting: Family Medicine

## 2016-07-28 ENCOUNTER — Ambulatory Visit (INDEPENDENT_AMBULATORY_CARE_PROVIDER_SITE_OTHER): Payer: Medicaid Other | Admitting: Family Medicine

## 2016-07-28 ENCOUNTER — Encounter: Payer: Self-pay | Admitting: Family Medicine

## 2016-07-28 VITALS — BP 164/94 | HR 82 | Temp 98.1°F | Ht 64.0 in | Wt 144.8 lb

## 2016-07-28 DIAGNOSIS — R109 Unspecified abdominal pain: Secondary | ICD-10-CM | POA: Diagnosis not present

## 2016-07-28 DIAGNOSIS — E139 Other specified diabetes mellitus without complications: Secondary | ICD-10-CM | POA: Diagnosis not present

## 2016-07-28 DIAGNOSIS — Z23 Encounter for immunization: Secondary | ICD-10-CM

## 2016-07-28 DIAGNOSIS — R300 Dysuria: Secondary | ICD-10-CM | POA: Diagnosis not present

## 2016-07-28 DIAGNOSIS — F0390 Unspecified dementia without behavioral disturbance: Secondary | ICD-10-CM

## 2016-07-28 HISTORY — DX: Unspecified abdominal pain: R10.9

## 2016-07-28 LAB — POCT URINALYSIS DIPSTICK
BILIRUBIN UA: NEGATIVE
Blood, UA: NEGATIVE
GLUCOSE UA: NEGATIVE
Ketones, UA: NEGATIVE
Leukocytes, UA: NEGATIVE
NITRITE UA: NEGATIVE
PH UA: 6
PROTEIN UA: NEGATIVE
SPEC GRAV UA: 1.02
UROBILINOGEN UA: 0.2

## 2016-07-28 NOTE — Assessment & Plan Note (Signed)
MMSE of 14 and history of recent cognitive decline.  Family members have been asking for home health care to help with ADLs and medication. Provided me with documentation to fill out for insurance to cover this. - We will fill out paperwork and call family members when finished

## 2016-07-28 NOTE — Telephone Encounter (Signed)
Spoke with patient's nephew and discussed results of UA.  Also let them know that I filled out the form for his personal care services request and left at the front desk for him to pick up.

## 2016-07-28 NOTE — Assessment & Plan Note (Addendum)
Patient has right flank pain without fever or chills.  Pain is about 5 out of 10 and does not radiate.  Denies seeing any blood in the urine does have dysuria for the past 2 weeks. - We will obtain UA and treat for UTI if appropriate - Recommended NSAIDs for analgesia and diclofenac - follow up in one month if UA negative
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
## 2016-07-28 NOTE — Patient Instructions (Signed)
Manuel Reyes, you were seen today to discuss your ongoing dizziness and right-sided back pain and to see if you would qualify for home health care.  The cause of your dizziness right now is unclear to me.  Your back pain may be due to a urinary tract infection because you are were telling me that you have burning with urination.  I checked your urine for any bacteria or blood and I will call you with the results and prescribe you some medicine if needed.  I will complete this paperwork for you and will call you when it is finished.   I will have our social worker call your nephew Manuel Reyes this afternoon to discuss what we might be able to do for you with regards to home health care.  Thanks for coming in to see me today, [name] Rosalyn Gess, [facility] Resident PGY-1 07/28/2016 11:33 AM

## 2016-08-09 ENCOUNTER — Telehealth: Payer: Self-pay | Admitting: Family Medicine

## 2016-08-09 NOTE — Telephone Encounter (Signed)
Spoke with patient's nephew regarding form.  I put it in under his name on 9/29 as previously documented. Not sure where it is now.   I asked him to provide me with the form again to fill out.

## 2016-08-09 NOTE — Telephone Encounter (Signed)
Patient's nephew came to pick up home assistance's form, but it wasn't found at [employer]. Please, follow up.

## 2016-08-09 NOTE — Telephone Encounter (Signed)
Routing to PCP to see if he recalls this, I checked up front and do not see it and I did not see it written in the book.   Please advise. Katharina Caper, April D, [place]

## 2016-08-23 ENCOUNTER — Encounter: Payer: Self-pay | Admitting: Family Medicine

## 2016-08-23 ENCOUNTER — Ambulatory Visit (INDEPENDENT_AMBULATORY_CARE_PROVIDER_SITE_OTHER): Payer: Medicaid Other | Admitting: Family Medicine

## 2016-08-23 ENCOUNTER — Other Ambulatory Visit: Payer: Self-pay | Admitting: Family Medicine

## 2016-08-23 VITALS — BP 102/66 | HR 106 | Temp 98.3°F | Ht 64.0 in | Wt 142.8 lb

## 2016-08-23 DIAGNOSIS — R0602 Shortness of breath: Secondary | ICD-10-CM

## 2016-08-23 DIAGNOSIS — I10 Essential (primary) hypertension: Secondary | ICD-10-CM | POA: Diagnosis not present

## 2016-08-23 DIAGNOSIS — Z0000 Encounter for general adult medical examination without abnormal findings: Secondary | ICD-10-CM | POA: Diagnosis not present

## 2016-08-23 DIAGNOSIS — R112 Nausea with vomiting, unspecified: Secondary | ICD-10-CM | POA: Insufficient documentation

## 2016-08-23 DIAGNOSIS — R300 Dysuria: Secondary | ICD-10-CM | POA: Diagnosis present

## 2016-08-23 LAB — POCT URINALYSIS DIPSTICK
GLUCOSE UA: NEGATIVE
NITRITE UA: NEGATIVE
SPEC GRAV UA: 1.015
UROBILINOGEN UA: 0.2
pH, UA: 5.5

## 2016-08-23 LAB — POCT GLYCOSYLATED HEMOGLOBIN (HGB A1C): HEMOGLOBIN A1C: 5.7

## 2016-08-23 MED ORDER — ALBUTEROL SULFATE (2.5 MG/3ML) 0.083% IN NEBU: 2.5000 mg | INHALATION_SOLUTION | Freq: Four times a day (QID) | RESPIRATORY_TRACT | 1 refills | Status: DC | PRN

## 2016-08-23 MED ORDER — PHENAZOPYRIDINE HCL 95 MG PO TABS: 95.0000 mg | ORAL_TABLET | Freq: Three times a day (TID) | ORAL | 0 refills | Status: DC | PRN

## 2016-08-23 NOTE — Progress Notes (Signed)
    Subjective:  Manuel Reyes is a 65 y.o. male who presents to the [facility] today for a follow up.    HPI:  Hypertension: Takes amlodipine 10 mg daily, and started on lisinopril/HCTZ 20-25 mg daily.  Denies any chest pain, shortness of breath, changes in vision.  SOB: Worse when lying down, no CP. Does have a cough That is nonproductive.  Feels that he may have had fevers and chills. Also notes burning in his chest after lying down shortly after meals.   Nausea/vomiting: Vomiting 2-3x per day for last week.  Also endorses some cough, subjective fevers and chills.  No diarrhea.  Possible sick contacts.  Did receive flu shot this year.  Poor appetite.  Does have a history of GERD, takes 20 mg omeprazole daily.  Dysuria:  Continued dysuria without any blood.  Does have some associated L lower back pain.  This is a chronic problem with muscle spasms.  Takes voltaren tablets daily.  Had UA at last visit that was negative.    History of present illness: Hypertension, back spasms, anxiety ROS: See history of present illness   Objective:  Physical Exam: BP 102/66   Pulse (!) 106   Temp 98.3 F (36.8 C) (Oral)   Ht 5\' 4"  (1.626 m)   Wt 142 lb 12.8 oz (64.8 kg)   BMI 24.51 kg/m   Gen: 65 year old [place] man in NAD, resting comfortably CV: RRR with no murmurs appreciated Pulm: NWOB, CTAB with no crackles, wheezes, or rhonchi GI: Normal bowel sounds present. Soft, Nontender, Nondistended. MSK: no edema, cyanosis, or clubbing noted. Bilateral paraspinal tenderness.  Skin: warm, dry Neuro: grossly normal, moves all extremities Psych: Normal affect and thought content  No results found for this or any previous visit (from the past 72 hour(s)).   Assessment/Plan:  Essential hypertension Pressure goal is 140/90. Last visit blood pressure was not within goal and we started lisinopril/hydrochlorothiazide 20-25mg  daily.  Blood pressure today was 102/66.  Patient patient is asymptomatic. -  Continue amlodipine 10 mg daily - Take lisinopril/and chlorothiazide one half pill daily - Try to take blood pressure 1-2 times per week at pharmacy and to record these findings - Follow-up 1 month  Shortness of breath Patient has heart failure with preserved ejection fraction, grade 1 diastolic dysfunction, but has no signs of fluid overload and lower extremities or crackles on lung exam.  Patient's a nonsmoker. Shortness of breath is worse when supine. Denies chest pain.  Patient is getting over a cold currently and has history of GERD.  - We'll prescribe albuterol inhaler PRN - Consider repeat echo - Follow-up 1 month  Nausea & vomiting Reports vomiting for the past week or so, about 2-3 times per day nonbloody nonbilious.  Also endorses fevers, chills and body aches, as well as nonproductive cough and upper respiratory symptoms. Likely the patient has been dealing with viral illness and this should resolve with time. - Watchful waiting - Follow-up 1 month  Dysuria Second visit for dysuria in 2 months.  Last visit UA was negative, patient presenting again with dysuria without any signs of blood.  Does endorse some bilateral flank pain, it is more consistent with his chronic bilateral muscle spasms.  Exam revealed bilateral paraspinal tenderness.  - Follow-up UA, culture - Ordered GC chlamydia, we'll have to order next appointment - Ordered Pyridium for pain control - Consider urology follow-up if this persists

## 2016-08-23 NOTE — Assessment & Plan Note (Signed)
Pressure goal is 140/90. Last visit blood pressure was not within goal and we started lisinopril/hydrochlorothiazide 20-25mg  daily.  Blood pressure today was 102/66.  Patient patient is asymptomatic. - Continue amlodipine 10 mg daily - Take lisinopril/and chlorothiazide one half pill daily - Try to take blood pressure 1-2 times per week at pharmacy and to record these findings - Follow-up 1 month

## 2016-08-23 NOTE — Assessment & Plan Note (Signed)
Second visit for dysuria in 2 months.  Last visit UA was negative, patient presenting again with dysuria without any signs of blood.  Does endorse some bilateral flank pain, it is more consistent with his chronic bilateral muscle spasms.  Exam revealed bilateral paraspinal tenderness.  - Follow-up UA, culture - Ordered GC chlamydia, we'll have to order next appointment - Ordered Pyridium for pain control - Consider urology follow-up if this persists

## 2016-08-23 NOTE — Assessment & Plan Note (Signed)
Reports vomiting for the past week or so, about 2-3 times per day nonbloody nonbilious.  Also endorses fevers, chills and body aches, as well as nonproductive cough and upper respiratory symptoms. Likely the patient has been dealing with viral illness and this should resolve with time. - Watchful waiting - Follow-up 1 month

## 2016-08-23 NOTE — Assessment & Plan Note (Addendum)
Patient has heart failure with preserved ejection fraction, grade 1 diastolic dysfunction, but has no signs of fluid overload and lower extremities or crackles on lung exam.  Patient's a nonsmoker. Shortness of breath is worse when supine. Denies chest pain.  Patient is getting over a cold currently and has history of GERD.  - We'll prescribe albuterol inhaler PRN - Consider repeat echo - Follow-up 1 month

## 2016-08-23 NOTE — Patient Instructions (Signed)
[place]. Today we discussed her shortness of breath that is worse when lying down. I have prescribed an albuterol inhaler that he can take as needed up to 4 times a day, please read the instructions.  This should improve her breathing.  I think your acid reflux is also contributing to your shortness of breath.  Please continue taking her omeprazole 20 mg daily.  You're continuing to have burning with urination, and I have prescribed you a  medication, Pyridium that should help with the pain. Take up to 3 times daily. I also checked your urine for any bacteria and if this comes up negative I would consider referring you to a urologist because you've been having symptoms for so long.  Her blood pressure is also low today, and I would like you to take half of your lisinopril/hydrochlorothiazide pill and please try to take her blood pressure at least 2 times per week at the pharmacy and write down these values for our next visit.   Your nausea and vomiting think is related to your recent viral illness.  This should resolve with time.  You are not currently dehydrated.    Thanks for coming in to see me today,  Cyrena Kuchenbecker L. Rosalyn Gess, [place] Resident PGY-1 08/23/2016 4:32 PM

## 2016-08-24 ENCOUNTER — Encounter: Payer: Self-pay | Admitting: Family Medicine

## 2016-08-25 ENCOUNTER — Telehealth: Payer: Self-pay | Admitting: Family Medicine

## 2016-08-25 ENCOUNTER — Other Ambulatory Visit: Payer: Self-pay | Admitting: Family Medicine

## 2016-08-25 LAB — URINE CULTURE

## 2016-08-25 MED ORDER — SULFAMETHOXAZOLE-TRIMETHOPRIM 800-160 MG PO TABS: 1.0000 | ORAL_TABLET | Freq: Two times a day (BID) | ORAL | 0 refills | Status: AC

## 2016-08-25 NOTE — Telephone Encounter (Addendum)
Spoke with patient's nephew to let patient know that he has a urinary tract infection and that I prescribed bactrim ds BID for 5 days.   He can call the office with any questions.  I recommended that he follow up in a week if he continues to have symptoms.

## 2016-11-28 ENCOUNTER — Ambulatory Visit (HOSPITAL_COMMUNITY)
Admission: EM | Admit: 2016-11-28 | Discharge: 2016-11-28 | Disposition: A | Discharge status: Home / Self Care | Payer: Medicaid Other

## 2016-11-28 NOTE — ED Notes (Signed)
Patient stated through an interpreter that he has an urge to urinate to and has had zero urinary output in 48 hours. Spoke with Dr. Juventino Slovak and he advised that he should be deferred to the [facility] ED for further evaluation.

## 2016-12-01 ENCOUNTER — Ambulatory Visit (INDEPENDENT_AMBULATORY_CARE_PROVIDER_SITE_OTHER): Payer: Medicaid Other | Admitting: Family Medicine

## 2016-12-01 ENCOUNTER — Encounter: Payer: Self-pay | Admitting: Family Medicine

## 2016-12-01 ENCOUNTER — Other Ambulatory Visit (HOSPITAL_COMMUNITY)
Admission: RE | Admit: 2016-12-01 | Discharge: 2016-12-01 | Disposition: A | Discharge status: Home / Self Care | Payer: Medicaid Other | Source: Ambulatory Visit | Attending: Family Medicine | Admitting: Family Medicine

## 2016-12-01 VITALS — BP 124/60 | HR 97 | Temp 98.1°F | Ht 64.0 in | Wt 148.0 lb

## 2016-12-01 DIAGNOSIS — R300 Dysuria: Secondary | ICD-10-CM | POA: Diagnosis present

## 2016-12-01 DIAGNOSIS — K2100 Gastro-esophageal reflux disease with esophagitis, without bleeding: Secondary | ICD-10-CM

## 2016-12-01 DIAGNOSIS — I10 Essential (primary) hypertension: Secondary | ICD-10-CM

## 2016-12-01 DIAGNOSIS — F411 Generalized anxiety disorder: Secondary | ICD-10-CM | POA: Diagnosis not present

## 2016-12-01 DIAGNOSIS — Z113 Encounter for screening for infections with a predominantly sexual mode of transmission: Secondary | ICD-10-CM | POA: Insufficient documentation

## 2016-12-01 LAB — POCT URINALYSIS DIPSTICK
BILIRUBIN UA: NEGATIVE
Blood, UA: NEGATIVE
GLUCOSE UA: NEGATIVE
LEUKOCYTES UA: NEGATIVE
Nitrite, UA: NEGATIVE
Protein, UA: 100
Spec Grav, UA: 1.025
Urobilinogen, UA: 0.2
pH, UA: 7

## 2016-12-01 MED ORDER — PHENAZOPYRIDINE HCL 95 MG PO TABS: 95.0000 mg | ORAL_TABLET | Freq: Three times a day (TID) | ORAL | 0 refills | Status: DC | PRN

## 2016-12-01 MED ORDER — OMEPRAZOLE 20 MG PO CPDR: 20.0000 mg | DELAYED_RELEASE_CAPSULE | Freq: Every day | ORAL | 8 refills | Status: DC

## 2016-12-01 MED ORDER — LEVETIRACETAM 500 MG PO TABS: 500.0000 mg | ORAL_TABLET | Freq: Two times a day (BID) | ORAL | 11 refills | Status: DC

## 2016-12-01 MED ORDER — DONEPEZIL HCL 10 MG PO TABS: ORAL_TABLET | ORAL | 12 refills | Status: DC

## 2016-12-01 MED ORDER — DOCUSATE SODIUM 100 MG PO CAPS: 100.0000 mg | ORAL_CAPSULE | Freq: Two times a day (BID) | ORAL | 0 refills | Status: DC

## 2016-12-01 MED ORDER — LEVOFLOXACIN 500 MG PO TABS: 500.0000 mg | ORAL_TABLET | Freq: Every day | ORAL | 0 refills | Status: AC

## 2016-12-01 MED ORDER — AMLODIPINE BESYLATE 10 MG PO TABS: 10.0000 mg | ORAL_TABLET | Freq: Every day | ORAL | 3 refills | Status: DC

## 2016-12-01 MED ORDER — DICLOFENAC SODIUM 75 MG PO TBEC: 75.0000 mg | DELAYED_RELEASE_TABLET | Freq: Two times a day (BID) | ORAL | 0 refills | Status: DC | PRN

## 2016-12-01 MED ORDER — ALBUTEROL SULFATE (2.5 MG/3ML) 0.083% IN NEBU: 2.5000 mg | INHALATION_SOLUTION | Freq: Four times a day (QID) | RESPIRATORY_TRACT | 1 refills | Status: DC | PRN

## 2016-12-01 MED ORDER — LISINOPRIL-HYDROCHLOROTHIAZIDE 20-25 MG PO TABS: 1.0000 | ORAL_TABLET | Freq: Every day | ORAL | 3 refills | Status: DC

## 2016-12-01 MED ORDER — VENLAFAXINE HCL ER 37.5 MG PO CP24: 37.5000 mg | ORAL_CAPSULE | Freq: Every day | ORAL | 5 refills | Status: DC

## 2016-12-01 NOTE — Patient Instructions (Signed)
You were seen today in the office for burning with urination.  On rectal exam you had a tender prostate, but it was not enlarged.  You may have an infection of your prostate causing these symptoms. I have prescribed you an antibiotic called levaquin that I want you to take once daily for 2 weeks.  I want to see you back in the office after that.   Very nice seeing you!  If you have any questions or concerns please call the office.   Maryruth Apple L. Rosalyn Gess, [place] Resident PGY-1 12/01/2016 4:10 PM

## 2016-12-01 NOTE — Assessment & Plan Note (Addendum)
Continued dysuria with some mild paraspinal tenderness at the mid-thoracic level. Prostate exam revealed some tenderness with palpation. Exam otherwise WNL. Presentation unclear at this time.  Will treat epirically for acute prostatitis.  - UA with reflex culture - urine GC/chlamydia - levaquin 500mg  x14 days - follow up in 2 weeks

## 2016-12-01 NOTE — Progress Notes (Signed)
    Subjective:  Manuel Reyes is a 66 y.o. male who presents to the [facility] today with a chief complaint of burning with urination  Medical interpreter Essie Christine [phone]  HPI:  Dysuria: Reporting continued dysuria and decreased urine output for past 3 days secondary to pain.  Denies blood, nausea, vomiting, diarrhea, fevers or chills. 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    PMH: back spasm, HTN Tobacco use: none Medication: reviewed and updated ROS: see HPI   Objective:  Physical Exam: BP 124/60   Pulse 97   Temp 98.1 F (36.7 C) (Oral)   Ht 5\' 4"  (1.626 m)   Wt 148 lb (67.1 kg)   SpO2 98%   BMI 25.40 kg/m   Gen: 66yo M in NAD, resting comfortably CV: RRR with no murmurs appreciated Pulm: NWOB, CTAB with no crackles, wheezes, or rhonchi GI: Normal bowel sounds present. Soft, Nontender, Nondistended. MSK: no edema, cyanosis, or clubbing noted. Right sided paraspinal tenderness about mid thoracic level.  Skin: warm, dry GU: normal appearing penis without discharge or obvious urethral stricture. No tenderness with testicular exam. Prostate exam revealed no bogginess, enlargement or nodules. Palpation did elicit tenderness.  Neuro: grossly normal, moves all extremities Psych: Normal affect and thought content  Results for orders placed or performed in visit on 12/01/16 (from the past 72 hour(s))  POCT urinalysis dipstick     Status: Abnormal   Collection Time: 12/01/16  4:18 PM  Result Value Ref Range   Color, UA YELLOW    Clarity, UA CLEAR    Glucose, UA NEG    Bilirubin, UA NEG    Ketones, UA TRACE    Spec Grav, UA 1.025    Blood, UA NEG    pH, UA 7.0    Protein, UA 100    Urobilinogen, UA 0.2    Nitrite, UA NEG    Leukocytes, UA Negative Negative     Assessment/Plan:  Dysuria Continued dysuria with some mild paraspinal tenderness at the mid-thoracic level. Prostate exam revealed some  tenderness with palpation. Exam otherwise WNL. Presentation unclear at this time.  Will treat epirically for acute prostatitis.  - UA with reflex culture - urine GC/chlamydia - levaquin 500mg  x14 days - follow up in 2 weeks

## 2016-12-04 LAB — URINE CYTOLOGY ANCILLARY ONLY
Chlamydia: NEGATIVE
Neisseria Gonorrhea: NEGATIVE

## 2016-12-18 ENCOUNTER — Encounter: Payer: Self-pay | Admitting: Family Medicine

## 2016-12-18 ENCOUNTER — Ambulatory Visit (INDEPENDENT_AMBULATORY_CARE_PROVIDER_SITE_OTHER): Payer: Medicaid Other | Admitting: Family Medicine

## 2016-12-18 ENCOUNTER — Other Ambulatory Visit: Payer: Self-pay | Admitting: Family Medicine

## 2016-12-18 VITALS — BP 118/78 | HR 64 | Temp 97.8°F | Ht 64.0 in | Wt 147.0 lb

## 2016-12-18 DIAGNOSIS — Z1159 Encounter for screening for other viral diseases: Secondary | ICD-10-CM

## 2016-12-18 DIAGNOSIS — R300 Dysuria: Secondary | ICD-10-CM

## 2016-12-18 DIAGNOSIS — Z1211 Encounter for screening for malignant neoplasm of colon: Secondary | ICD-10-CM

## 2016-12-18 MED ORDER — DONEPEZIL HCL 10 MG PO TABS: ORAL_TABLET | ORAL | 12 refills | Status: DC

## 2016-12-18 NOTE — Patient Instructions (Addendum)
You were seen today for a follow up for your burning with urination.  I am glad to hear that you are feeling better.  Your medical donepezil was sent to [place] today and you can go pick that up.  Very nice seeing you today.  Olman Yono L. Rosalyn Gess, [place] Resident PGY-1 12/18/2016 3:59 PM

## 2016-12-18 NOTE — Progress Notes (Signed)
    Subjective:  Manuel Reyes is a 66 y.o. male who presents to the [facility] today for a follow up for dysuria.    HPI:  Dysuria follow up: Presented two weeks ago with persistent dysuria and decreased urine output 2/2 pain. UA and urine culture negative and GC chlamydia also negative. He was prescribed levaquin 500mg  x14 days to treat empirically for prostatitis. Denies any further dysuria, hematuria or increased frequency.    PMH: GERD, HTN Tobacco use: none Medication: reviewed and updated ROS: see HPI   Objective:  Physical Exam: BP 118/78   Pulse 64   Temp 97.8 F (36.6 C) (Oral)   Ht 5\' 4"  (1.626 m)   Wt 147 lb (66.7 kg)   SpO2 97%   BMI 25.23 kg/m   Gen: 66yo M in NAD, resting comfortably CV: RRR with no murmurs appreciated Pulm: NWOB, CTAB with no crackles, wheezes, or rhonchi GI: Normal bowel sounds present. Soft, Nontender, Nondistended. MSK: no edema, cyanosis, or clubbing noted Skin: warm, dry Neuro: grossly normal, moves all extremities GU: no suprapubic or CVA tenderness Psych: Normal affect and thought content  No results found for this or any previous visit (from the past 72 hour(s)).   Assessment/Plan:  Dysuria Resolved. Now status post Levaquin 500 milligrams 14 days and is asymptomatic.  No suprapubic or CVA tenderness and patient discharge.  - Continue to monitor  Health maintenance Due for hepatitis C screening and colonoscopy. Patient states that he had an ophthalmology visit about 6 months ago, although I do not have these records. - Follow up hepatitis C - GI referral for colonoscopy

## 2016-12-18 NOTE — Assessment & Plan Note (Addendum)
Resolved. Now status post Levaquin 500 milligrams 14 days and is asymptomatic.  No suprapubic or CVA tenderness and patient discharge.  - Continue to monitor

## 2016-12-19 ENCOUNTER — Ambulatory Visit (INDEPENDENT_AMBULATORY_CARE_PROVIDER_SITE_OTHER): Payer: Medicaid Other | Admitting: Adult Health

## 2016-12-19 ENCOUNTER — Encounter: Payer: Self-pay | Admitting: Adult Health

## 2016-12-19 ENCOUNTER — Encounter: Payer: Self-pay | Admitting: Family Medicine

## 2016-12-19 VITALS — BP 131/85 | HR 84 | Ht 64.0 in | Wt 147.0 lb

## 2016-12-19 DIAGNOSIS — F0390 Unspecified dementia without behavioral disturbance: Secondary | ICD-10-CM | POA: Diagnosis not present

## 2016-12-19 LAB — HEPATITIS C ANTIBODY: HCV Ab: NEGATIVE

## 2016-12-19 MED ORDER — DONEPEZIL HCL 10 MG PO TABS: 10.0000 mg | ORAL_TABLET | Freq: Every day | ORAL | 12 refills | Status: DC

## 2016-12-19 MED ORDER — MEMANTINE HCL 5 MG PO TABS: 5.0000 mg | ORAL_TABLET | Freq: Two times a day (BID) | ORAL | 11 refills | Status: DC

## 2016-12-19 NOTE — Patient Instructions (Signed)
Memory score is slightly declined Family member should be present for ALL visits Continue Aricept Start Nemenda 5 mg twice a day  Memantine extended release capsules What is this medicine? MEMANTINE (MEM an teen) is used to treat dementia caused by Alzheimer's disease. COMMON BRAND NAME(S): Namenda XR What should I tell my health care provider before I take this medicine? They need to know if you have any of these conditions: -difficulty passing urine -kidney disease -liver disease -seizures -an unusual or allergic reaction to memantine, other medicines, foods, dyes, or preservatives -pregnant or trying to get pregnant -breast-feeding How should I use this medicine? Take this medicine by mouth with a glass of water. Follow the directions on the prescription label. You may take this medicine with or without food. You may swallow the capsules whole or open them and sprinkle the entire contents on applesauce before swallowing. Other than sprinkling the medicine on applesauce, the capsules should be swallowed whole and not divided, chewed, or crushed. Take your doses at regular intervals. Do not take your medicine more often than directed. Continue to take your medicine even if you feel better. Do not stop taking except on the advice of your doctor or health care professional. Talk to your pediatrician regarding the use of this medicine in children. Special care may be needed. What if I miss a dose? If you miss a dose, take it as soon as you can. If it is almost time for your next dose, take only that dose. Do not take double or extra doses. If you do not take your medicine for several days, contact your health care provider. Your dose may need to be changed. What may interact with this medicine? -acetazolamide -amantadine -cimetidine -dextromethorphan -dofetilide -hydrochlorothiazide -ketamine -metformin -methazolamide -quinidine -ranitidine -sodium bicarbonate -triamterene What  should I watch for while using this medicine? Visit your doctor or health care professional for regular checks on your progress. Check with your doctor or health care professional if there is no improvement in your symptoms or if they get worse. You may get drowsy or dizzy. Do not drive, use machinery, or do anything that needs mental alertness until you know how this drug affects you. Do not stand or sit up quickly, especially if you are an older patient. This reduces the risk of dizzy or fainting spells. Alcohol can make you more drowsy and dizzy. Avoid alcoholic drinks. What side effects may I notice from receiving this medicine? Side effects that you should report to your doctor or health care professional as soon as possible: -agitation or a feeling of restlessness -allergic reactions like skin rash, itching or hives, swelling of the face, lips, or tongue -depressed mood -dizziness -hallucinations -redness, blistering, peeling or loosening of the skin, including inside the mouth -seizures -vomiting Side effects that usually do not require medical attention (report to your doctor or health care professional if they continue or are bothersome): -constipation -diarrhea -headache -nausea -trouble sleeping Where should I keep my medicine? Keep out of the reach of children. Store at room temperature between 15 degrees and 30 degrees C (59 degrees and 86 degrees F). Throw away any unused medicine after the expiration date.  2017 [employer] Standard (2015-11-18 10:09:47)
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
## 2017-01-07 NOTE — Progress Notes (Signed)
Personally  participated in, made any corrections needed, and agree with history, physical, neuro exam,assessment and plan as stated above.    Jalaya Sarver, MD [facility] 

## 2017-02-19 ENCOUNTER — Encounter: Payer: Self-pay | Admitting: Family Medicine

## 2017-03-05 ENCOUNTER — Ambulatory Visit (INDEPENDENT_AMBULATORY_CARE_PROVIDER_SITE_OTHER): Payer: Medicaid Other | Admitting: Obstetrics and Gynecology

## 2017-03-05 ENCOUNTER — Encounter: Payer: Self-pay | Admitting: Obstetrics and Gynecology

## 2017-03-05 VITALS — BP 162/91 | HR 69 | Temp 97.8°F | Wt 154.0 lb

## 2017-03-05 DIAGNOSIS — I10 Essential (primary) hypertension: Secondary | ICD-10-CM

## 2017-03-05 DIAGNOSIS — K5900 Constipation, unspecified: Secondary | ICD-10-CM | POA: Diagnosis not present

## 2017-03-05 MED ORDER — AMLODIPINE BESYLATE 10 MG PO TABS: 10.0000 mg | ORAL_TABLET | Freq: Every day | ORAL | 0 refills | Status: DC

## 2017-03-05 MED ORDER — SENNOSIDES-DOCUSATE SODIUM 8.6-50 MG PO TABS: 1.0000 | ORAL_TABLET | Freq: Every day | ORAL | 1 refills | Status: DC

## 2017-03-05 MED ORDER — LISINOPRIL-HYDROCHLOROTHIAZIDE 20-25 MG PO TABS: 1.0000 | ORAL_TABLET | Freq: Every day | ORAL | 0 refills | Status: DC

## 2017-03-05 NOTE — Progress Notes (Signed)
   Subjective:   Patient ID: Manuel Reyes, male    DOB: 1951/08/27, 66 y.o.   MRN: [id number]  Patient presents for Same Day Appointment  Chief Complaint  Patient presents with  . Hypertension  . Abdominal Pain    HPI: # Hypertension Her for follow-up on hypertension and medication refill Blood pressure at home: not monitored  Blood pressure today: elevated but ran out of BP medication yesterday Taking Meds: yes Side effects: denies ROS: Denies headache, dizziness, visual changes, nausea, vomiting, chest pain, abdominal pain or shortness of breath.   #Constipation Endorsing constipation Only have small little bowel movements Denies any straining or blood in stool Denies abdominal pain Stool is soft Denies use of pain medications even though on medication list  Review of Systems   See HPI for ROS.   History  Smoking Status  . Never Smoker  Smokeless Tobacco  . Never Used    Past medical history, surgical, family, and social history reviewed and updated in the EMR as appropriate.  Objective:  BP (!) 162/91   Pulse 69   Temp 97.8 F (36.6 C) (Oral)   Wt 154 lb (69.9 kg)   SpO2 97%   BMI 26.43 kg/m  Vitals and nursing note reviewed  Physical Exam  Constitutional: He is well-developed, well-nourished, and in no distress.  Cardiovascular: Normal rate.   Pulmonary/Chest: Effort normal.  Abdominal: Soft. He exhibits no distension. There is no tenderness. There is no guarding.    Assessment & Plan:  Please see separate assessment and plan  Diagnosis and plan along with any newly prescribed medication(s) were discussed in detail with this patient today. The patient verbalized understanding and agreed with the plan. Patient advised if symptoms worsen return to clinic or ER.   PATIENT EDUCATION PROVIDED: See AVS   Luiz Blare, DO 03/05/2017, 3:32 PM PGY-3, [place]

## 2017-03-05 NOTE — Patient Instructions (Addendum)
Medications refilled and sent to pharmacy Medication sent for constipation  Follow-up with Dr. Rosalyn Gess about papers.

## 2017-03-07 DIAGNOSIS — K5909 Other constipation: Secondary | ICD-10-CM | POA: Insufficient documentation

## 2017-03-07 DIAGNOSIS — K5900 Constipation, unspecified: Secondary | ICD-10-CM | POA: Insufficient documentation

## 2017-03-07 NOTE — Assessment & Plan Note (Signed)
Elevated pressures today. BP usually well controlled. Most likely elevated due to being out of medication. Refills given without dose adjustments. Patient to follow-up with PCP in one month.

## 2017-03-07 NOTE — Assessment & Plan Note (Signed)
Has Linzess on mediation list but patient does not appear to think he is taking this. Do not see any active problem on problem list indicating need for medication. Healthy bowel habits discussed. Rx given for senokot to help with bowel movements.

## 2017-03-19 ENCOUNTER — Telehealth: Payer: Self-pay | Admitting: Family Medicine

## 2017-03-19 NOTE — Telephone Encounter (Signed)
Thanks [employer].  These papers were not given to me at his last visit.  They ended up in my inbox at some point in the last week.  They are requesting that I fill out a disability form.  I completed a form several months ago and at that point he did not qualify and will likely not at this time either.  It will require some time to fill out as this is a [employer] administrator and requires me to fill out questions in paragraph form.

## 2017-03-19 NOTE — Telephone Encounter (Signed)
Patient's nephew Hom came by office to see if patient's citizenship papers had been filled out. They were given to doctor at last office visit 03/05/17. Please call nephew and let him know status [phone]

## 2017-03-21 NOTE — Telephone Encounter (Signed)
Contacted pt nephew per previous note and and gave him the message from Dr. Rosalyn Gess.  Explained to him that it was a lengthy form that had to be completed with lots of paragraph explanations.  Told him that once completed that he would be contacted.  Routing to PCP as an Pharmacist, hospital. Katharina Caper, Loyal Rudy D, [place]

## 2017-03-23 NOTE — Telephone Encounter (Signed)
Called patient's nephew and clarified that he needs to come into clinic to have this paper work done.

## 2017-03-30 ENCOUNTER — Encounter: Payer: Self-pay | Admitting: Family Medicine

## 2017-03-30 ENCOUNTER — Ambulatory Visit (INDEPENDENT_AMBULATORY_CARE_PROVIDER_SITE_OTHER): Payer: Medicaid Other | Admitting: Family Medicine

## 2017-03-30 DIAGNOSIS — K2100 Gastro-esophageal reflux disease with esophagitis, without bleeding: Secondary | ICD-10-CM

## 2017-03-30 DIAGNOSIS — F0390 Unspecified dementia without behavioral disturbance: Secondary | ICD-10-CM | POA: Diagnosis not present

## 2017-03-30 MED ORDER — OMEPRAZOLE 20 MG PO CPDR: 20.0000 mg | DELAYED_RELEASE_CAPSULE | Freq: Every day | ORAL | 8 refills | Status: DC

## 2017-03-30 NOTE — Patient Instructions (Signed)
Thank you for coming in to see me today.  I completed your paperwork for disability and refilled your omeprazole.  I feel that the sensation of SOB that you are experiencing is related to your acid reflux. Continue taking this medicine daily.   Follow up as needed.  Very nice to see you.  Unknown Flannigan L. Rosalyn Gess, [facility] Resident PGY-1 03/30/2017 2:03 PM

## 2017-03-30 NOTE — Progress Notes (Signed)
    Subjective:  Manuel Reyes is a 66 y.o. male who presents to the [facility] today to have disability paper work filled out and to discuss a "breathing problem"  HPI:  Manuel Reyes this 66 year old man from [place] presenting today at my request in order for me to fill out disability paperwork that specific information on how his dementia and limits his ability to assimilate into society.  Additionally the patient is complaining of a "breathing problem".  He is having difficulty getting a full breath for the past 2 weeks, but denies any chest pain, shortness of breath, cough, wheezing or hemoptysis. Does endorse some epigastric tenderness and heartburn. Reportedly stopped his omeprazole about 2 weeks ago because he ran out of his medication.   PMH: Dementia, seizure disorder Tobacco use: No tobacco use Medication: reviewed and updated ROS: see HPI   Objective:  Physical Exam: BP 134/90   Pulse 72   Temp 98.3 F (36.8 C) (Oral)   Ht 5\' 4"  (1.626 m)   Wt 150 lb (68 kg)   SpO2 97%   BMI 25.75 kg/m   Gen: 66 year old male in NAD, resting comfortably CV: RRR with no murmurs appreciated Pulm: NWOB, CTAB with no crackles, wheezes, or rhonchi GI: Normal bowel sounds present. Soft, Nontender, Nondistended. MSK: no edema, cyanosis, or clubbing noted Skin: warm, dry Neuro: grossly normal, moves all extremities Psych: Normal affect and thought content  No results found for this or any previous visit (from the past 72 hour(s)).   Assessment/Plan:  GERD (gastroesophageal reflux disease) Patient reports difficulty in getting full breath but denies any chest pain, shortness of breath or cough. This has been ongoing for the past 2 weeks and is coinciding with his stopping omeprazole about 2 weeks ago. With his history of GERD it is likely that his reflux is causing his respiratory symptoms. - Restart omeprazole 20 mg daily - Follow-up as needed  Dementia Stable on donepezil and memantine.   Patient was accompanied by his nephew who reports significant memory impairment and brought disability paperwork for me to fill out.  Given patient's dementia and cognitive disability I think that this is appropriate.  - Continue current regimen

## 2017-04-02 NOTE — Assessment & Plan Note (Signed)
Stable on donepezil and memantine.  Patient was accompanied by his nephew who reports significant memory impairment and brought disability paperwork for me to fill out.  Given patient's dementia and cognitive disability I think that this is appropriate.  - Continue current regimen

## 2017-04-02 NOTE — Assessment & Plan Note (Signed)
Patient reports difficulty in getting full breath but denies any chest pain, shortness of breath or cough. This has been ongoing for the past 2 weeks and is coinciding with his stopping omeprazole about 2 weeks ago. With his history of GERD it is likely that his reflux is causing his respiratory symptoms. - Restart omeprazole 20 mg daily - Follow-up as needed

## 2017-06-01 ENCOUNTER — Ambulatory Visit (INDEPENDENT_AMBULATORY_CARE_PROVIDER_SITE_OTHER): Payer: Medicaid Other | Admitting: Family Medicine

## 2017-06-01 ENCOUNTER — Encounter: Payer: Self-pay | Admitting: Family Medicine

## 2017-06-01 VITALS — BP 150/110 | HR 68 | Temp 98.3°F | Ht 64.0 in | Wt 153.8 lb

## 2017-06-01 DIAGNOSIS — E785 Hyperlipidemia, unspecified: Secondary | ICD-10-CM | POA: Diagnosis not present

## 2017-06-01 DIAGNOSIS — F411 Generalized anxiety disorder: Secondary | ICD-10-CM

## 2017-06-01 DIAGNOSIS — Z0100 Encounter for examination of eyes and vision without abnormal findings: Secondary | ICD-10-CM

## 2017-06-01 DIAGNOSIS — M7061 Trochanteric bursitis, right hip: Secondary | ICD-10-CM | POA: Diagnosis not present

## 2017-06-01 DIAGNOSIS — Z1211 Encounter for screening for malignant neoplasm of colon: Secondary | ICD-10-CM | POA: Diagnosis not present

## 2017-06-01 DIAGNOSIS — I10 Essential (primary) hypertension: Secondary | ICD-10-CM | POA: Diagnosis not present

## 2017-06-01 DIAGNOSIS — M25551 Pain in right hip: Secondary | ICD-10-CM | POA: Diagnosis not present

## 2017-06-01 DIAGNOSIS — K2100 Gastro-esophageal reflux disease with esophagitis, without bleeding: Secondary | ICD-10-CM

## 2017-06-01 MED ORDER — LEVETIRACETAM 500 MG PO TABS: 500.0000 mg | ORAL_TABLET | Freq: Two times a day (BID) | ORAL | 11 refills | Status: DC

## 2017-06-01 MED ORDER — ALBUTEROL SULFATE (2.5 MG/3ML) 0.083% IN NEBU: 2.5000 mg | INHALATION_SOLUTION | Freq: Four times a day (QID) | RESPIRATORY_TRACT | 1 refills | Status: DC | PRN

## 2017-06-01 MED ORDER — OMEPRAZOLE 20 MG PO CPDR: 20.0000 mg | DELAYED_RELEASE_CAPSULE | Freq: Every day | ORAL | 8 refills | Status: DC

## 2017-06-01 MED ORDER — ROSUVASTATIN CALCIUM 20 MG PO TABS: 20.0000 mg | ORAL_TABLET | Freq: Every day | ORAL | 3 refills | Status: DC

## 2017-06-01 MED ORDER — SENNOSIDES-DOCUSATE SODIUM 8.6-50 MG PO TABS: 1.0000 | ORAL_TABLET | Freq: Every day | ORAL | 1 refills | Status: DC

## 2017-06-01 MED ORDER — DOCUSATE SODIUM 100 MG PO CAPS: 100.0000 mg | ORAL_CAPSULE | Freq: Two times a day (BID) | ORAL | 0 refills | Status: DC

## 2017-06-01 MED ORDER — LISINOPRIL-HYDROCHLOROTHIAZIDE 20-25 MG PO TABS: 1.0000 | ORAL_TABLET | Freq: Every day | ORAL | 0 refills | Status: DC

## 2017-06-01 MED ORDER — DONEPEZIL HCL 10 MG PO TABS: 10.0000 mg | ORAL_TABLET | Freq: Every day | ORAL | 12 refills | Status: DC

## 2017-06-01 MED ORDER — AMLODIPINE BESYLATE 10 MG PO TABS: 10.0000 mg | ORAL_TABLET | Freq: Every day | ORAL | 0 refills | Status: DC

## 2017-06-01 MED ORDER — DICLOFENAC SODIUM 75 MG PO TBEC: 75.0000 mg | DELAYED_RELEASE_TABLET | Freq: Two times a day (BID) | ORAL | 0 refills | Status: DC | PRN

## 2017-06-01 MED ORDER — MEMANTINE HCL 5 MG PO TABS: 5.0000 mg | ORAL_TABLET | Freq: Two times a day (BID) | ORAL | 11 refills | Status: DC

## 2017-06-01 MED ORDER — VENLAFAXINE HCL ER 37.5 MG PO CP24: 37.5000 mg | ORAL_CAPSULE | Freq: Every day | ORAL | 5 refills | Status: DC

## 2017-06-01 MED ORDER — METHYLPREDNISOLONE ACETATE 40 MG/ML IJ SUSP
40.0000 mg | Freq: Once | INTRAMUSCULAR | Status: AC
  Administered 2017-06-01: 40 mg via INTRAMUSCULAR

## 2017-06-01 NOTE — Progress Notes (Signed)
    Subjective:  Manuel Reyes is a 66 y.o. male who presents to the [facility] today for medication refills  HPI:  Hypertension BP Readings from Last 3 Encounters:  06/01/17 (!) 150/110  03/30/17 134/90  03/05/17 (!) 162/91   Home BP monitoring-no Compliant with medications-mostly yes, but has not had any medication for one week. No side effects. ROS-Denies any CP, HA, SOB, blurry vision, LE edema, transient weakness, orthopnea, PND.   Right sided hip pain: Reports pain on right lateral hip worse with lying on his side x49month. No numbness or tingling down extremity, weakness or loss of ROM.  No trauma. Has tried tylenol and ibuprofen without improvement.    PMH: dementia, HTN Tobacco use: none Medication: reviewed and updated ROS: see HPI   Objective:  Physical Exam: BP (!) 150/110   Pulse 68   Temp 98.3 F (36.8 C) (Oral)   Ht 5\' 4"  (1.626 m)   Wt 153 lb 12.8 oz (69.8 kg)   SpO2 95%   BMI 26.40 kg/m   Gen: 66yo M in NAD, resting comfortably CV: RRR with no murmurs appreciated Pulm: NWOB, CTAB with no crackles, wheezes, or rhonchi GI: Normal bowel sounds present. Soft, Nontender, Nondistended. MSK: Hip: full PROM/AROM without pain. TTP on lateral hip at greater trochanter, no anterior hip pain.  Skin: warm, dry Neuro: grossly normal, moves all extremities Psych: Normal affect and thought content  No results found for this or any previous visit (from the past 72 hour(s)).  INJECTION: Patient was given informed consent, signed copy in the chart. 3 cc of methylprednisolone 40 mg/ml plus  1 cc of 1% lidocaine without epinephrine was injected into the right trochanteric bursa. The patient tolerated the procedure well. There were no complications. Post procedure instructions were given.  Assessment/Plan:  Essential hypertension Goal BP <140/90 and elevated to 150/110 today. Likely due to being out of medication for the past week.  Denies symptoms.  - continue current  regimen - follow up in three months for blood pressure check   Trochanteric bursitis of right hip TTP over right lateral hip at greater trochanter likely representing trochanteric bursitis. Patient failed conservative treatment with OTC medication. Agreed to steroid injection and tolerated procedure well.   - discussed return precautions - will reassess at next visit - could likely benefit from PT if symptoms persist  Health maintenance: Due for colonoscopy an opthalmology visit.  - placed referrals for both

## 2017-06-01 NOTE — Patient Instructions (Addendum)
Manuel Reyes was seen in the office today for medication refills.  After he has a colonoscopy and eye exam he will be up to date for everything.   I also gave him a steroid injection in his right hip for his ongoing pain that is probably from lying on his right side.  If he develops any worsening pain, fevers, chills, redness, swelling or drainage from the area I want him to come back to be seen.   Please come back in three months for a blood pressure check and follow up hip pain or sooner if pain persists.   Very nice to see you all today,  Daniel L. Rosalyn Gess, [facility] Resident PGY-2 06/01/2017 5:01 PM

## 2017-06-03 DIAGNOSIS — M7061 Trochanteric bursitis, right hip: Secondary | ICD-10-CM | POA: Insufficient documentation

## 2017-06-03 NOTE — Assessment & Plan Note (Signed)
TTP over right lateral hip at greater trochanter likely representing trochanteric bursitis. Patient failed conservative treatment with OTC medication. Agreed to steroid injection and tolerated procedure well.   - discussed return precautions - will reassess at next visit - could likely benefit from PT if symptoms persist

## 2017-06-03 NOTE — Assessment & Plan Note (Signed)
Goal BP <140/90 and elevated to 150/110 today. Likely due to being out of medication for the past week.  Denies symptoms.  - continue current regimen - follow up in three months for blood pressure check

## 2017-06-27 ENCOUNTER — Ambulatory Visit: Payer: Medicaid Other | Admitting: Neurology

## 2017-07-05 ENCOUNTER — Encounter: Payer: Self-pay | Admitting: Neurology

## 2017-07-10 ENCOUNTER — Encounter: Payer: Self-pay | Admitting: Gastroenterology

## 2017-07-23 ENCOUNTER — Encounter (HOSPITAL_COMMUNITY): Payer: Self-pay | Admitting: Emergency Medicine

## 2017-07-23 ENCOUNTER — Ambulatory Visit (HOSPITAL_COMMUNITY)
Admission: EM | Admit: 2017-07-23 | Discharge: 2017-07-23 | Disposition: A | Discharge status: Home / Self Care | Payer: Medicaid Other | Attending: Urgent Care | Admitting: Urgent Care

## 2017-07-23 DIAGNOSIS — Z79899 Other long term (current) drug therapy: Secondary | ICD-10-CM | POA: Insufficient documentation

## 2017-07-23 DIAGNOSIS — R8290 Unspecified abnormal findings in urine: Secondary | ICD-10-CM

## 2017-07-23 DIAGNOSIS — M62830 Muscle spasm of back: Secondary | ICD-10-CM | POA: Diagnosis not present

## 2017-07-23 DIAGNOSIS — M546 Pain in thoracic spine: Secondary | ICD-10-CM | POA: Diagnosis not present

## 2017-07-23 LAB — POCT URINALYSIS DIP (DEVICE)
BILIRUBIN URINE: NEGATIVE
Glucose, UA: NEGATIVE mg/dL
Ketones, ur: NEGATIVE mg/dL
NITRITE: NEGATIVE
Protein, ur: NEGATIVE mg/dL
Specific Gravity, Urine: 1.015 (ref 1.005–1.030)
UROBILINOGEN UA: 0.2 mg/dL (ref 0.0–1.0)
pH: 6 (ref 5.0–8.0)

## 2017-07-23 MED ORDER — TRAMADOL-ACETAMINOPHEN 37.5-325 MG PO TABS: 1.0000 | ORAL_TABLET | Freq: Three times a day (TID) | ORAL | 0 refills | Status: DC | PRN

## 2017-07-23 MED ORDER — CYCLOBENZAPRINE HCL 5 MG PO TABS: 5.0000 mg | ORAL_TABLET | Freq: Three times a day (TID) | ORAL | 0 refills | Status: DC | PRN

## 2017-07-23 NOTE — ED Provider Notes (Signed)
MRN: [id number] DOB: [birth year]  Subjective:   Manuel Reyes is a 66 y.o. male presenting for chief complaint of Back Pain  Reports 4 day history sharp, constant, bilateral mid back pain. Pain is worse with deep breathing, coughing. He is also peeing frequently. Has not tried any medications for relief. Denies fever, dysuria, hematuria, n/v, abdominal pain, falls, trauma. Denies history of diabetes. Denies history of CKD.  No current facility-administered medications for this encounter.   Current Outpatient Prescriptions:  .  albuterol (PROVENTIL) (2.5 MG/3ML) 0.083% nebulizer solution, Take 3 mLs (2.5 mg total) by nebulization every 6 (six) hours as needed for wheezing or shortness of breath., Disp: 150 mL, Rfl: 1 .  amLODipine (NORVASC) 10 MG tablet, Take 1 tablet (10 mg total) by mouth daily., Disp: 90 tablet, Rfl: 0 .  diclofenac (VOLTAREN) 75 MG EC tablet, Take 1 tablet (75 mg total) by mouth 2 (two) times daily as needed., Disp: 30 tablet, Rfl: 0 .  docusate sodium (COLACE) 100 MG capsule, Take 1 capsule (100 mg total) by mouth 2 (two) times daily., Disp: 30 capsule, Rfl: 0 .  donepezil (ARICEPT) 10 MG tablet, Take 1 tablet (10 mg total) by mouth at bedtime., Disp: 30 tablet, Rfl: 12 .  levETIRAcetam (KEPPRA) 500 MG tablet, Take 1 tablet (500 mg total) by mouth 2 (two) times daily., Disp: 60 tablet, Rfl: 11 .  linaclotide (LINZESS) 290 MCG CAPS capsule, Take 290 mcg by mouth daily before breakfast., Disp: , Rfl:  .  lisinopril-hydrochlorothiazide (PRINZIDE,ZESTORETIC) 20-25 MG tablet, Take 1 tablet by mouth daily., Disp: 90 tablet, Rfl: 0 .  memantine (NAMENDA) 5 MG tablet, Take 1 tablet (5 mg total) by mouth 2 (two) times daily., Disp: 60 tablet, Rfl: 11 .  omeprazole (PRILOSEC) 20 MG capsule, Take 1 capsule (20 mg total) by mouth daily., Disp: 30 capsule, Rfl: 8 .  phenazopyridine (PYRIDIUM) 95 MG tablet, Take 1 tablet (95 mg total) by mouth 3 (three) times daily as needed for pain.  (Patient not taking: Reported on 06/01/2017), Disp: 15 tablet, Rfl: 0 .  rosuvastatin (CRESTOR) 20 MG tablet, Take 1 tablet (20 mg total) by mouth daily., Disp: 90 tablet, Rfl: 3 .  senna-docusate (SENOKOT-S) 8.6-50 MG tablet, Take 1 tablet by mouth daily., Disp: 30 tablet, Rfl: 1 .  venlafaxine XR (EFFEXOR XR) 37.5 MG 24 hr capsule, Take 1 capsule (37.5 mg total) by mouth daily with breakfast., Disp: 30 capsule, Rfl: 5   Manuel Reyes has No Known Allergies.  Manuel Reyes  has a past medical history of Depression; Diabetes ([place]); High cholesterol; Hypertension; and Stomach ulcer. 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  Objective:   Vitals: BP 138/88 (BP Location: Left Arm)   Pulse 73   Temp 98.4 F (36.9 C) (Oral)   Resp (!) 22   SpO2 96%   Physical Exam  Constitutional: He is oriented to person, place, and time. He appears well-developed and well-nourished.  Cardiovascular: Normal rate, regular rhythm and intact distal pulses.  Exam reveals no gallop and no friction rub.   No murmur heard. Pulmonary/Chest: No respiratory distress. He has no wheezes. He has no rales.  Musculoskeletal:       Thoracic back: He exhibits decreased range of motion (rotation, lateral flexion), tenderness (over spasms) and spasm (significant spasms bilaterally across thoracic back extending into upper lumbar region). He exhibits no bony tenderness, no swelling, no edema and no deformity.  Neurological: He is  alert and oriented to person, place, and time.  Skin: Skin is warm and dry.  Psychiatric: He has a normal mood and affect.   Results for orders placed or performed during the hospital encounter of 07/23/17 (from the past 24 hour(s))  POCT urinalysis dip (device)     Status: Abnormal   Collection Time: 07/23/17  8:27 PM  Result Value Ref Range   Glucose, UA NEGATIVE NEGATIVE mg/dL   Bilirubin Urine NEGATIVE NEGATIVE   Ketones, ur NEGATIVE NEGATIVE mg/dL    Specific Gravity, Urine 1.015 1.005 - 1.030   Hgb urine dipstick TRACE (A) NEGATIVE   pH 6.0 5.0 - 8.0   Protein, ur NEGATIVE NEGATIVE mg/dL   Urobilinogen, UA 0.2 0.0 - 1.0 mg/dL   Nitrite NEGATIVE NEGATIVE   Leukocytes, UA TRACE (A) NEGATIVE   Assessment and Plan :   Muscle spasm of back  Acute bilateral thoracic back pain  Abnormal urinalysis  Start APAP for musculoskeletal back pain, use Ultracet for breakthrough pain. Flexeril for muscle spasms. Advised adequate hydration. Urine culture is pending, will rx antibiotic course if culture is positive.  Jaynee Eagles, PA-C [facility]  07/23/2017  7:51 PM    Jaynee Eagles, PA-C 07/23/17 2037

## 2017-07-23 NOTE — ED Triage Notes (Signed)
Complains of back pain for 3-4 days.  Pain is right and left mid-back.  Pain is noticeable with speaking, breathing, coughing.  Pain in back hurts to a point that he cannot get a deep breath.  No uri symptoms.  No known injury

## 2017-07-23 NOTE — Discharge Instructions (Signed)
You may take 500mg  Tylenol every 6 hours for pain and inflammation. Make sure he drinks 2 liters of water daily. If Flexeril causes drowsiness, then please take it at night only.

## 2017-07-25 LAB — URINE CULTURE: Culture: <10000 — AB

## 2017-07-27 ENCOUNTER — Encounter: Payer: Self-pay | Admitting: Family Medicine

## 2017-07-27 ENCOUNTER — Ambulatory Visit (INDEPENDENT_AMBULATORY_CARE_PROVIDER_SITE_OTHER): Payer: Medicaid Other | Admitting: Family Medicine

## 2017-07-27 DIAGNOSIS — M546 Pain in thoracic spine: Secondary | ICD-10-CM | POA: Diagnosis not present

## 2017-07-27 MED ORDER — BACLOFEN 10 MG PO TABS: 10.0000 mg | ORAL_TABLET | Freq: Three times a day (TID) | ORAL | 0 refills | Status: DC

## 2017-07-27 MED ORDER — TROLAMINE SALICYLATE 10 % EX CREA: 1.0000 "application " | TOPICAL_CREAM | Freq: Three times a day (TID) | CUTANEOUS | 0 refills | Status: DC | PRN

## 2017-07-27 NOTE — Progress Notes (Signed)
Subjective:    Patient ID: Manuel Reyes , male   DOB: Dec 17, 1950 , 66 y.o..   MRN: [id number]  HPI  Manuel Reyes is a 66 yo M with PMH of HLD, dementia, HTN, CHF here for  Chief Complaint  Patient presents with  . Back Pain    1. BACK PAIN  Back pain began 8 days ago. Pain is described as sharp. Patient has tried Flexeril and Ultracet. Pain radiates nowhere. History of trauma or injury: none Prior history of similar pain: no History of cancer: no Weak immune system:  no History of IV drug use: no History of steroid use: no  Symptoms Incontinence of bowel or bladder:  no Numbness of leg: no Fever: no Rest or Night pain: no Weight Loss:  no Rash: no  Patient unsure what might be causing his pain.  ROS see HPI Smoking Status noted.  Past Medical History: Patient Active Problem List   Diagnosis Date Noted  . Back pain 07/31/2017  . Trochanteric bursitis of right hip 06/03/2017  . Constipation 03/07/2017  . Dysuria 08/23/2016  . Flank pain 07/28/2016  . Dementia 06/13/2016  . 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 Dry skin 05/09/2016  . Awareness alteration, transient 01/21/2016  . Neuropathy 01/21/2016  . Frequent falls 12/01/2015  . Seizure-like activity ([place]) 12/01/2015  . Memory difficulties 10/30/2015  . Chronic diastolic heart failure ([place]) 10/28/2015  . PND (paroxysmal nocturnal dyspnea) 05/21/2015  . Cephalalgia 04/16/2015  . Hyperlipidemia 04/15/2015  . Essential hypertension 03/18/2015  . Back muscle spasm 03/18/2015  . GERD (gastroesophageal reflux disease) 03/18/2015    Medications: reviewed and updated Current Outpatient Prescriptions  Medication Sig Dispense Refill  . albuterol (PROVENTIL) (2.5 MG/3ML) 0.083% nebulizer solution Take 3 mLs (2.5 mg total) by nebulization every 6 (six) hours as needed for wheezing or shortness of breath. 150 mL 1  . amLODipine (NORVASC) 10 MG tablet Take 1 tablet (10 mg total) by  mouth daily. 90 tablet 0  . baclofen (LIORESAL) 10 MG tablet Take 1 tablet (10 mg total) by mouth 3 (three) times daily. 30 each 0  . diclofenac (VOLTAREN) 75 MG EC tablet Take 1 tablet (75 mg total) by mouth 2 (two) times daily as needed. 30 tablet 0  . docusate sodium (COLACE) 100 MG capsule Take 1 capsule (100 mg total) by mouth 2 (two) times daily. 30 capsule 0  . donepezil (ARICEPT) 10 MG tablet Take 1 tablet (10 mg total) by mouth at bedtime. 30 tablet 12  . levETIRAcetam (KEPPRA) 500 MG tablet Take 1 tablet (500 mg total) by mouth 2 (two) times daily. 60 tablet 11  . linaclotide (LINZESS) 290 MCG CAPS capsule Take 290 mcg by mouth daily before breakfast.    . lisinopril-hydrochlorothiazide (PRINZIDE,ZESTORETIC) 20-25 MG tablet Take 1 tablet by mouth daily. 90 tablet 0  . memantine (NAMENDA) 5 MG tablet Take 1 tablet (5 mg total) by mouth 2 (two) times daily. 60 tablet 11  . omeprazole (PRILOSEC) 20 MG capsule Take 1 capsule (20 mg total) by mouth daily. 30 capsule 8  . phenazopyridine (PYRIDIUM) 95 MG tablet Take 1 tablet (95 mg total) by mouth 3 (three) times daily as needed for pain. (Patient not taking: Reported on 06/01/2017) 15 tablet 0  . rosuvastatin (CRESTOR) 20 MG tablet Take 1 tablet (20 mg total) by mouth daily. 90 tablet 3  . senna-docusate (SENOKOT-S) 8.6-50 MG tablet Take 1 tablet by mouth daily. 30 tablet  1  . traMADol-acetaminophen (ULTRACET) 37.5-325 MG tablet Take 1 tablet by mouth every 8 (eight) hours as needed. 15 tablet 0  . trolamine salicylate (ASPER-FLEX) 10 % cream Apply 1 application topically 3 (three) times daily as needed for muscle pain. 85 g 0  . venlafaxine XR (EFFEXOR XR) 37.5 MG 24 hr capsule Take 1 capsule (37.5 mg total) by mouth daily with breakfast. 30 capsule 5   No current facility-administered medications for this visit.     Social Hx:  reports that he has never smoked. He has never used smokeless tobacco.   Objective:   BP 130/78   Pulse 75    Temp 97.6 F (36.4 C) (Oral)   Ht 5\' 4"  (1.626 m)   Wt 151 lb 3.2 oz (68.6 kg)   SpO2 99%   BMI 25.95 kg/m  Physical Exam  Gen: NAD, alert, cooperative with exam, well-appearing  Back Exam:  Inspection: Unremarkable  Palpable tenderness: Palpable tenderness on paraspinal thoracic area with spasms Range of Motion: Restricted in all directions secondary to pain Leg strength: Quad: 5/5 Hamstring: 5/5 Hip flexor: 5/5 Hip abductors: 5/5  Strength at foot: Plantar-flexion: 5/5 Dorsi-flexion: 5/5 Eversion: 5/5 Inversion: 5/5  Sensory change: Gross sensation intact to all lumbar and sacral dermatomes.  Reflexes: 2+ at both patellar tendons Gait unremarkable.   Assessment & Plan:  Back pain Bilateral paraspinal thoracic back pain/spasms. Exam most consistent with muscle strain. Unclear why patient strained his paraspinal muscles, no clear cause. No signs of fracture or spinous process tenderness. No red flag symptoms.  - Will stop Flexeril again begin Baclofen 10 mg TID PRN x 1 week (better for geriatric patients) - Avoid PRN Tramadol if possible - Continue PRN Tylenol and start Asper creme - Return precautions discussed   Meds ordered this encounter  Medications  . baclofen (LIORESAL) 10 MG tablet    Sig: Take 1 tablet (10 mg total) by mouth 3 (three) times daily.    Dispense:  30 each    Refill:  0  . trolamine salicylate (ASPER-FLEX) 10 % cream    Sig: Apply 1 application topically 3 (three) times daily as needed for muscle pain.    Dispense:  85 g    Refill:  0    Smitty Cords, MD [facility], PGY-3

## 2017-07-27 NOTE — Patient Instructions (Signed)
Thank you for coming in today, it was so nice to see you! Today we talked about:    Back pain: I believe the back pain is from muscle strain. He has some spasms in his muscles when they are touched. Please stop the Flexeril. You can continue giving the Ultracet if he would like.  I have sent a new prescription for another kind of muscle relaxer called baclofen to his pharmacy. Please take this as prescribed. I have also prescribed a cream to go on your back.  Use heating pads at home on the area where there is pain  Please come back in 2 weeks if there is no improvement in his symptoms  If you have any questions or concerns, please do not hesitate to call the office at [phone]. You can also message me directly via MyChart.   Sincerely,  Smitty Cords, MD

## 2017-07-31 DIAGNOSIS — M549 Dorsalgia, unspecified: Secondary | ICD-10-CM | POA: Insufficient documentation

## 2017-07-31 NOTE — Assessment & Plan Note (Signed)
Bilateral paraspinal thoracic back pain/spasms. Exam most consistent with muscle strain. Unclear why patient strained his paraspinal muscles, no clear cause. No signs of fracture or spinous process tenderness. No red flag symptoms.  - Will stop Flexeril again begin Baclofen 10 mg TID PRN x 1 week (better for geriatric patients) - Avoid PRN Tramadol if possible - Continue PRN Tylenol and start Asper creme - Return precautions discussed

## 2017-08-03 ENCOUNTER — Encounter: Payer: Medicaid Other | Admitting: Gastroenterology

## 2017-08-06 ENCOUNTER — Emergency Department (HOSPITAL_COMMUNITY): Payer: Medicaid Other

## 2017-08-06 ENCOUNTER — Emergency Department (HOSPITAL_COMMUNITY)
Admission: EM | Admit: 2017-08-06 | Discharge: 2017-08-07 | Disposition: A | Discharge status: Home / Self Care | Payer: Medicaid Other | Attending: Emergency Medicine | Admitting: Emergency Medicine

## 2017-08-06 ENCOUNTER — Encounter (HOSPITAL_COMMUNITY): Payer: Self-pay | Admitting: Emergency Medicine

## 2017-08-06 DIAGNOSIS — F0390 Unspecified dementia without behavioral disturbance: Secondary | ICD-10-CM | POA: Insufficient documentation

## 2017-08-06 DIAGNOSIS — I110 Hypertensive heart disease with heart failure: Secondary | ICD-10-CM | POA: Insufficient documentation

## 2017-08-06 DIAGNOSIS — F329 Major depressive disorder, single episode, unspecified: Secondary | ICD-10-CM | POA: Diagnosis not present

## 2017-08-06 DIAGNOSIS — R0602 Shortness of breath: Secondary | ICD-10-CM | POA: Diagnosis present

## 2017-08-06 DIAGNOSIS — Z79899 Other long term (current) drug therapy: Secondary | ICD-10-CM | POA: Insufficient documentation

## 2017-08-06 DIAGNOSIS — R079 Chest pain, unspecified: Secondary | ICD-10-CM | POA: Diagnosis not present

## 2017-08-06 DIAGNOSIS — I5032 Chronic diastolic (congestive) heart failure: Secondary | ICD-10-CM | POA: Diagnosis not present

## 2017-08-06 DIAGNOSIS — E119 Type 2 diabetes mellitus without complications: Secondary | ICD-10-CM | POA: Diagnosis not present

## 2017-08-06 LAB — BASIC METABOLIC PANEL
ANION GAP: 14 (ref 5–15)
BUN: 13 mg/dL (ref 6–20)
CHLORIDE: 106 mmol/L (ref 101–111)
CO2: 19 mmol/L — AB (ref 22–32)
Calcium: 9.7 mg/dL (ref 8.9–10.3)
Creatinine, Ser: 0.76 mg/dL (ref 0.61–1.24)
GFR calc Af Amer: >60 mL/min (ref >60)
GLUCOSE: 75 mg/dL (ref 65–99)
POTASSIUM: 4.4 mmol/L (ref 3.5–5.1)
Sodium: 139 mmol/L (ref 135–145)

## 2017-08-06 LAB — CBC
HEMATOCRIT: 54.6 % — AB (ref 39.0–52.0)
HEMOGLOBIN: 18.5 g/dL — AB (ref 13.0–17.0)
MCH: 30.9 pg (ref 26.0–34.0)
MCHC: 33.9 g/dL (ref 30.0–36.0)
MCV: 91.2 fL (ref 78.0–100.0)
Platelets: 170 10*3/uL (ref 150–400)
RBC: 5.99 MIL/uL — ABNORMAL HIGH (ref 4.22–5.81)
RDW: 13.7 % (ref 11.5–15.5)
WBC: 8.4 10*3/uL (ref 4.0–10.5)

## 2017-08-06 LAB — I-STAT TROPONIN, ED: Troponin i, poc: 0.01 ng/mL (ref 0.00–0.08)

## 2017-08-06 MED ORDER — MORPHINE SULFATE (PF) 4 MG/ML IV SOLN
4.0000 mg | Freq: Once | INTRAVENOUS | Status: AC
  Administered 2017-08-07: 4 mg via INTRAVENOUS
  Filled 2017-08-06: qty 1

## 2017-08-06 MED ORDER — IOPAMIDOL (ISOVUE-370) INJECTION 76%
INTRAVENOUS | Status: AC
  Administered 2017-08-06: 100 mL via INTRAVENOUS
  Filled 2017-08-06: qty 100

## 2017-08-06 MED ORDER — SUCRALFATE 1 G PO TABS
1.0000 g | ORAL_TABLET | Freq: Once | ORAL | Status: AC
  Administered 2017-08-06: 1 g via ORAL
  Filled 2017-08-06: qty 1

## 2017-08-06 MED ORDER — LIDOCAINE VISCOUS 2 % MT SOLN
15.0000 mL | Freq: Once | OROMUCOSAL | Status: AC
  Administered 2017-08-06: 15 mL via OROMUCOSAL
  Filled 2017-08-06: qty 15

## 2017-08-06 NOTE — ED Provider Notes (Signed)
[facility] DEPT Provider Note  CSN: [id number] Arrival date & time: 08/06/17  1903  History   Chief Complaint Chief Complaint  Patient presents with  . Shortness of Breath  . Chest Pain   HPI Lawrnce Pao Reyes is a 66 y.o. male.  The patient is a 66yo male with a past medical history significant for diabetes, CHF, HTN, and HLD, who presents to the ED complaining of shortness of breath, chest pain, and back pain. The patient reports his pain has been 10/10 in severity today.  It is worse with inspiration and worse with a deep breath, and he occasionally feels short of breath when inspiring deeply.  The location of the pain is nonspecific and he has difficulty describing it, however he points to his entire chest and upper abdomen as well as his entire back.  He has not taken anything for his symptoms today.  The pain radiates to his back, epigastrium, and left side.   He was recently evaluated for non-traumatic back pain and has been taking Baclofen as prescribed, however without relief of symptoms.     The history is provided by the patient and medical records. The history is limited by a language barrier. A language interpreter was used (patient's son).   Past Medical History:  Diagnosis Date  . Depression   . Diabetes ([place])   . High cholesterol   . Hypertension   . Stomach ulcer    Patient Active Problem List   Diagnosis Date Noted  . Back pain 07/31/2017  . Trochanteric bursitis of right hip 06/03/2017  . Constipation 03/07/2017  . Dysuria 08/23/2016  . Flank pain 07/28/2016  . Dementia 06/13/2016  . 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 Dry skin 05/09/2016  . Awareness alteration, transient 01/21/2016  . Neuropathy 01/21/2016  . Frequent falls 12/01/2015  . Seizure-like activity ([place]) 12/01/2015  . Memory difficulties 10/30/2015  . Chronic diastolic heart failure ([place]) 10/28/2015  . PND (paroxysmal nocturnal dyspnea) 05/21/2015  . Cephalalgia  04/16/2015  . Hyperlipidemia 04/15/2015  . Essential hypertension 03/18/2015  . Back muscle spasm 03/18/2015  . GERD (gastroesophageal reflux disease) 03/18/2015   Past Surgical History:  Procedure Laterality Date  . AMPUTATION Left 04/16/2016   Procedure: AMPUTATION DIGIT;  Surgeon: Iran Planas, MD;  Location: [facility];  Service: Orthopedics;  Laterality: Left;  . NO PAST SURGERIES      Home Medications    Prior to Admission medications   Medication Sig Start Date End Date Taking? Authorizing Provider  albuterol (PROVENTIL) (2.5 MG/3ML) 0.083% nebulizer solution Take 3 mLs (2.5 mg total) by nebulization every 6 (six) hours as needed for wheezing or shortness of breath. 06/01/17  Yes [name], MD  amLODipine (NORVASC) 10 MG tablet Take 1 tablet (10 mg total) by mouth daily. 06/01/17  Yes [name], MD  baclofen (LIORESAL) 10 MG tablet Take 1 tablet (10 mg total) by mouth 3 (three) times daily. 07/27/17  Yes Carlyle Dolly, MD  cyclobenzaprine (FLEXERIL) 5 MG tablet Take 5 mg by mouth 3 (three) times daily as needed for muscle spasms. 07/23/17  Yes [provider]  donepezil (ARICEPT) 10 MG tablet Take 1 tablet (10 mg total) by mouth at bedtime. 06/01/17  Yes [name], MD  ibuprofen (ADVIL,MOTRIN) 200 MG tablet Take 200-400 mg by mouth every 6 (six) hours as needed (for pain).   Yes [provider]  levETIRAcetam (KEPPRA) 500 MG tablet Take 1 tablet (500  mg total) by mouth 2 (two) times daily. 06/01/17  Yes [name], MD  lisinopril-hydrochlorothiazide (PRINZIDE,ZESTORETIC) 20-25 MG tablet Take 1 tablet by mouth daily. 06/01/17  Yes [name], MD  memantine (NAMENDA) 5 MG tablet Take 1 tablet (5 mg total) by mouth 2 (two) times daily. 06/01/17  Yes [name], MD  omeprazole (PRILOSEC) 20 MG capsule Take 1 capsule (20 mg total) by mouth daily. 06/01/17  Yes [name], MD  rosuvastatin (CRESTOR) 20 MG tablet Take 1 tablet (20 mg total)  by mouth daily. 06/01/17  Yes [name], MD  traMADol-acetaminophen (ULTRACET) 37.5-325 MG tablet Take 1 tablet by mouth every 8 (eight) hours as needed. Patient taking differently: Take 1 tablet by mouth every 8 (eight) hours as needed (for pain).  07/23/17  Yes Jaynee Eagles, PA-C  trolamine salicylate (ASPER-FLEX) 10 % cream Apply 1 application topically 3 (three) times daily as needed for muscle pain. 07/27/17  Yes Carlyle Dolly, MD  venlafaxine XR (EFFEXOR XR) 37.5 MG 24 hr capsule Take 1 capsule (37.5 mg total) by mouth daily with breakfast. 06/01/17  Yes [name], MD  diclofenac (VOLTAREN) 75 MG EC tablet Take 1 tablet (75 mg total) by mouth 2 (two) times daily as needed. 06/01/17   [name], MD  docusate sodium (COLACE) 100 MG capsule Take 1 capsule (100 mg total) by mouth 2 (two) times daily. 06/01/17   [name], MD  phenazopyridine (PYRIDIUM) 95 MG tablet Take 1 tablet (95 mg total) by mouth 3 (three) times daily as needed for pain. Patient not taking: Reported on 08/06/2017 12/01/16   [name], MD  senna-docusate (SENOKOT-S) 8.6-50 MG tablet Take 1 tablet by mouth daily. 06/01/17   [name], MD    Family History Family History  Problem Relation Age of Onset  . Seizures Neg Hx   . Dementia Neg Hx    Social History Social History  Substance Use Topics  . Smoking status: Never Smoker  . Smokeless tobacco: Never Used  . Alcohol use 0.6 oz/week    1 Cans of beer per week     Comment: Beer ocass    Allergies   Patient has no known allergies.  Review of Systems Review of Systems  Constitutional: Negative for chills and fever.  HENT: Negative for ear pain and sore throat.   Eyes: Negative for pain and visual disturbance.  Respiratory: Positive for shortness of breath. Negative for cough.   Cardiovascular: Positive for chest pain. Negative for palpitations.  Gastrointestinal: Negative for abdominal pain and vomiting.  Genitourinary:  Negative for dysuria, hematuria and urgency.  Musculoskeletal: Positive for back pain. Negative for arthralgias.  Skin: Negative for color change and rash.  Allergic/Immunologic: Negative for immunocompromised state.  Neurological: Negative for seizures and syncope.  Hematological: Negative.   Psychiatric/Behavioral: Negative.   All other systems reviewed and are negative.  Physical Exam Updated Vital Signs BP (!) 133/97   Pulse 67   Temp 98 F (36.7 C) (Oral)   Resp 18   Ht 5\' 4"  (1.626 m)   Wt 68.5 kg (151 lb)   SpO2 92%   BMI 25.92 kg/m   Physical Exam  Constitutional: He appears well-developed and well-nourished.  HENT:  Head: Normocephalic and atraumatic.  Mouth/Throat: Oropharynx is clear and moist.  Eyes: Pupils are equal, round, and reactive to light. Conjunctivae are normal.  Neck: Neck supple.  Cardiovascular: Normal rate, regular rhythm, normal heart  sounds and intact distal pulses.   No murmur heard. Pulmonary/Chest: Effort normal and breath sounds normal. No stridor. No respiratory distress. He has no wheezes. He exhibits tenderness (diffuse).  Abdominal: Soft. He exhibits no mass. There is no tenderness. There is no guarding.  Musculoskeletal: He exhibits tenderness (entire back tender). He exhibits no edema.  Neurological: He is alert.  Skin: Skin is warm and dry.  Psychiatric: His behavior is normal. Thought content normal.  Flat affect  Nursing note and vitals reviewed.   ED Treatments / Results  Labs (all labs ordered are listed, but only abnormal results are displayed) Labs Reviewed  BASIC METABOLIC PANEL - Abnormal; Notable for the following:       Result Value   CO2 19 (*)    All other components within normal limits  CBC - Abnormal; Notable for the following:    RBC 5.99 (*)    Hemoglobin 18.5 (*)    HCT 54.6 (*)    All other components within normal limits  I-STAT TROPONIN, ED  I-STAT TROPONIN, ED   EKG  EKG  Interpretation  Date/Time:  Monday August 06 2017 19:15:44 EDT Ventricular Rate:  96 PR Interval:  150 QRS Duration: 78 QT Interval:  330 QTC Calculation: 416 R Axis:   9 Text Interpretation:  Normal sinus rhythm Right atrial enlargement Possible Anterior infarct , age undetermined Abnormal ECG No significant change since last tracing Confirmed by Addison Lank [phone]) on 08/06/2017 10:21:56 PM      EKG: - Rate: normal - Rhythm: normal sinus - Axis: no axis deviation - Intervals: normal PR, narrow QRS complex, and normal QTc - ST/T waves: no T wave or ST changes suggestive of ischemia or infarct - Comparison to prior: no significant changes when compared to EKG from 04/16/16  Radiology Dg Chest 2 View  Result Date: 08/06/2017 CLINICAL DATA:  Left-sided chest pain for 1 week. EXAM: CHEST  2 VIEW COMPARISON:  04/18/2012 FINDINGS: Cardiomediastinal silhouette is normal. Apparent widening of the mediastinal contours. There is no evidence of pneumothorax. Streaky opacities in bilateral lower lobes may represent atelectasis versus scarring. Low lung volumes. Anterior compression deformity of 2 of the thoracic vertebral bodies with unknown acuity. Soft tissues are grossly normal. IMPRESSION: Apparent widening of the mediastinal contours. While this may be due to low lung volumes, acute vascular pathology cannot be excluded and therefore CT angiogram of the chest is recommended for further evaluation. Bilateral areas of atelectasis versus scarring. Compression deformity likely representing age-indeterminate compression fractures of 2 of the vertebral bodies of the thoracic spine. These results were called by telephone at the time of interpretation on 08/06/2017 at 8:22 pm to Dr. Laverta Baltimore , who verbally acknowledged these results. Electronically Signed   By: Fidela Salisbury M.D.   On: 08/06/2017 20:33   Ct Angio Chest/abd/pel For Dissection W And/or W/wo  Result Date: 08/06/2017 CLINICAL DATA:   Chronic mid chest and left flank pain. Assess for dissection. Initial encounter. EXAM: CT ANGIOGRAPHY CHEST, ABDOMEN AND PELVIS TECHNIQUE: Multidetector CT imaging through the chest, abdomen and pelvis was performed using the standard protocol during bolus administration of intravenous contrast. Multiplanar reconstructed images and MIPs were obtained and reviewed to evaluate the vascular anatomy. CONTRAST:  100 mL of Isovue 370 IV contrast COMPARISON:  Chest radiograph performed earlier today at 8:04 p.m. FINDINGS: CTA CHEST FINDINGS Cardiovascular: There is no evidence of aortic dissection. There is no evidence of aneurysmal dilatation. Minimal calcification is noted along the aortic arch.  The heart is normal in size. The great vessels are unremarkable in appearance. Mediastinum/Nodes: The mediastinum is unremarkable in appearance. No mediastinal lymphadenopathy is seen. No pericardial effusion is identified. The visualized portions of the thyroid gland are grossly unremarkable. No axillary lymphadenopathy is seen. Lungs/Pleura: Minimal bilateral atelectasis is noted. No pleural effusion or pneumothorax is seen. No masses are identified. Evaluation of the lung fields is somewhat suboptimal due to motion artifact. Musculoskeletal: No acute osseous abnormalities are identified. The visualized musculature is unremarkable in appearance. Review of the MIP images confirms the above findings. CTA ABDOMEN AND PELVIS FINDINGS VASCULAR Aorta: There is no evidence of aortic dissection. There is no evidence of aneurysmal dilatation. Mild calcification is noted along the distal abdominal aorta. Celiac: The celiac trunk appears fully patent. SMA: The superior mesenteric artery remains fully patent. Renals: The renal arteries appear patent bilaterally. IMA: The inferior mesenteric artery remains patent. Inflow: The common, internal and external iliac arteries appear grossly patent. The common femoral arteries and their branches  are grossly unremarkable, aside from minimal calcification along the common femoral arteries bilaterally. Veins: Visualized venous structures are grossly unremarkable in appearance. The inferior vena cava is grossly unremarkable in appearance. Review of the MIP images confirms the above findings. NON-VASCULAR Hepatobiliary: The liver is unremarkable in appearance. The gallbladder is unremarkable in appearance. The common bile duct remains normal in caliber. Pancreas: The pancreas is within normal limits. Spleen: The spleen is unremarkable in appearance. Adrenals/Urinary Tract: The adrenal glands are unremarkable in appearance. The kidneys are within normal limits. There is no evidence of hydronephrosis. No renal or ureteral stones are identified. No perinephric stranding is seen. Stomach/Bowel: The stomach is unremarkable in appearance. The small bowel is within normal limits. The appendix is normal in caliber, without evidence of appendicitis. The colon is unremarkable in appearance. Lymphatic: No retroperitoneal or pelvic sidewall lymphadenopathy is seen. Reproductive: The bladder is mildly distended and grossly unremarkable. The prostate remains normal in size. Other: No additional soft tissue abnormalities are seen. Musculoskeletal: No acute osseous abnormalities are identified. There is mild chronic loss of height at vertebral body T11. The visualized musculature is unremarkable in appearance. Review of the MIP images confirms the above findings. IMPRESSION: 1. No evidence of aortic dissection. No evidence of aneurysmal dilatation. 2. Minimal abdominal aortic atherosclerosis noted. 3. Minimal bilateral atelectasis noted.  Lungs otherwise clear. 4. Mild chronic loss of height at vertebral body T11. Electronically Signed   By: Garald Balding M.D.   On: 08/06/2017 23:49   Procedures Procedures (including critical care time)  Medications Ordered in ED Medications  lidocaine (XYLOCAINE) 2 % viscous mouth  solution 15 mL (15 mLs Mouth/Throat Given 08/06/17 2256)  sucralfate (CARAFATE) tablet 1 g (1 g Oral Given 08/06/17 2256)  iopamidol (ISOVUE-370) 76 % injection (100 mLs Intravenous Contrast Given 08/06/17 2307)  morphine 4 MG/ML injection 4 mg (4 mg Intravenous Given 08/07/17 0000)  alum & mag hydroxide-simeth (MAALOX/MYLANTA) 200-200-20 MG/5ML suspension 15 mL (15 mLs Oral Given 08/07/17 0043)    Initial Impression / Assessment and Plan / ED Course  I have reviewed the triage vital signs and the nursing notes.  Pertinent labs & imaging results that were available during my care of the patient were reviewed by me and considered in my medical decision making (see chart for details).   Initial differential diagnosis included ACS, dissection, pneumonia, pericarditis, PE, anxiety, depression.  Pertinent labs included CBC with no leukocytosis; hemoglobin increased to 18.5, suggesting possible hemoconcentration.  Normal platelet count.  Serial troponins negative.  EKG with no evidence of arrhythmia, ischemia, or infarct.  Imaging studies included a chest x-ray concerning for a widened mediastinum.  Given the patient's nonspecific chest pain and upper abdominal pain, I therefore elected to obtain a CT dissection study, which was negative for acute pathology.  The patient was given a GI cocktail for epigastric pain and morphine for chest pain.     12:55 AM Upon reassessment following administration of viscous lidocaine, Carafate, and morphine, the patient's pain was somewhat improved and had migrated to his left side.   12:55 AM Maalox given.  On reassessment, the patient's pain was moderately improved and he felt comfortable going home with his son.  Based on the above findings, I believe the patient is suffering from nonspecific chest pain at this time. I have a low suspicion for ACS given serial negative troponins and no EKG changes to suggest this diagnosis; HEAR score 2. No dissection evident on CT.    No risk factors for esophageal perforation.  I believe there may be a psychogenic component to the patient's migratory pain.    I discussed the above results with the patient and his son who verbalized understanding.  Return precautions and follow-up plans discussed including close PCP follow-up later this week.  The patient was discharged in stable condition.  Final Clinical Impressions(s) / ED Diagnoses   Final diagnoses:  Chest pain, unspecified type   New Prescriptions New Prescriptions   No medications on file     Charisse March, MD 08/07/17 [id number]    Fatima Blank, MD 08/08/17 [id number]

## 2017-08-06 NOTE — ED Triage Notes (Signed)
Pt c/o left side 10/10 cp mostly with deep bread and difficulty breathing, denies any fever or chills.

## 2017-08-06 NOTE — ED Notes (Signed)
Pt taken to CT.

## 2017-08-07 LAB — I-STAT TROPONIN, ED: Troponin i, poc: 0.01 ng/mL (ref 0.00–0.08)

## 2017-08-07 MED ORDER — ALUM & MAG HYDROXIDE-SIMETH 200-200-20 MG/5ML PO SUSP
15.0000 mL | Freq: Once | ORAL | Status: AC
  Administered 2017-08-07: 15 mL via ORAL
  Filled 2017-08-07: qty 30

## 2017-08-07 NOTE — Discharge Instructions (Signed)
Take all medications as prescribed.  Please follow-up with your primary doctor this week for re-evaluation.  Return to the ED for worsening symptoms.

## 2017-08-07 NOTE — ED Notes (Signed)
Pt departed in NAD.  

## 2017-08-24 ENCOUNTER — Telehealth: Payer: Self-pay | Admitting: *Deleted

## 2017-08-24 NOTE — Telephone Encounter (Signed)
Spoke with son- explained we need to schedule an OV due to complicated med hx and recent Ed for chest pain- son states he needs a Friday appt- scheduled appt with son  for 3 pm 11-9 Friday - mailed AVS with date time location to home address per son's request - cancelled Pv for today and 11-9 colon , also called and cancelled interpreter for today   Lelan Pons PV

## 2017-08-24 NOTE — Telephone Encounter (Signed)
I can see him in office visit first prior to scheduling if you can help coordinate. Thanks

## 2017-08-24 NOTE — Telephone Encounter (Signed)
Dr Havery Moros,   This pt is scheduled for a PV today at 430. I was looking in his chart and see he had an ED visit 10-8 for chest pain. The Discharge note states ED doc believes was non cardiac related, all Troponins negative, Pt has no Cardio F/ U noted in epic . just want to make sure you are ok for a [facility] colon 11-9. Pt also has a hx of seizures , constipation, CHF, HTN, fall risk, dementia and has no prev GI history.  Please advise  thanks for your time, Marijean Niemann

## 2017-09-07 ENCOUNTER — Encounter: Payer: Medicaid Other | Admitting: Gastroenterology

## 2017-09-07 ENCOUNTER — Ambulatory Visit: Payer: Medicaid Other | Admitting: Gastroenterology

## 2017-09-18 ENCOUNTER — Ambulatory Visit: Payer: Medicaid Other | Admitting: Gastroenterology

## 2017-09-18 ENCOUNTER — Encounter: Payer: Self-pay | Admitting: Gastroenterology

## 2017-09-18 ENCOUNTER — Encounter (INDEPENDENT_AMBULATORY_CARE_PROVIDER_SITE_OTHER): Payer: Self-pay

## 2017-09-18 VITALS — BP 176/104 | HR 80 | Ht 64.0 in | Wt 151.6 lb

## 2017-09-18 DIAGNOSIS — Z1211 Encounter for screening for malignant neoplasm of colon: Secondary | ICD-10-CM

## 2017-09-18 MED ORDER — NA SULFATE-K SULFATE-MG SULF 17.5-3.13-1.6 GM/177ML PO SOLN: 1.0000 | ORAL | 0 refills | Status: DC

## 2017-09-18 NOTE — Progress Notes (Signed)
09/18/2017 Manuel Reyes [id number] August 21, 1951   HISTORY OF PRESENT ILLNESS: This is a 66 year old male from [place] who has been referred to our office by Dr. Rosalyn Gess in order to discuss colonoscopy.  He is here today with his son who provides translation, but there is still significant language barrier even with his help.  Patient has never had colonoscopy in the past.  He had been in the ER in early October with complaints of chest pain that were believed to be noncardiac in origin.  EKG showed no acute issues and troponins were negative.  He reports no further chest pain since that time.  He does have a history of seizures, but reports that his last seizure was 2 years ago.  Has some mild dementia for which he takes some medications.  He reports some left-sided abdominal pain that is apparently been present for the past 5 years.  He says that he takes ibuprofen for it and that seems to help.  He says that he is not having any pain currently.  He denies any rectal bleeding or issues moving his bowels.  CT angio of the chest, abdomen, pelvis was also performed in early October and showed minimal abdominal aortic atherosclerosis, but no other significant findings.   Past Medical History:  Diagnosis Date  . Depression   . Diabetes ([place])   . High cholesterol   . Hypertension   . Stomach ulcer    Past Surgical History:  Procedure Laterality Date  . AMPUTATION Left 04/16/2016   Procedure: AMPUTATION DIGIT;  Surgeon: Iran Planas, MD;  Location: [facility];  Service: Orthopedics;  Laterality: Left;  . NO PAST SURGERIES      reports that  has never smoked. he has never used smokeless tobacco. He reports that he drinks about 0.6 oz of alcohol per week. He reports that he does not use drugs. family history is not on file. No Known Allergies    Outpatient Encounter Medications as of 09/18/2017  Medication Sig  . albuterol (PROVENTIL) (2.5 MG/3ML) 0.083% nebulizer solution Take 3 mLs (2.5 mg  total) by nebulization every 6 (six) hours as needed for wheezing or shortness of breath.  Marland Kitchen amLODipine (NORVASC) 10 MG tablet Take 1 tablet (10 mg total) by mouth daily.  . baclofen (LIORESAL) 10 MG tablet Take 1 tablet (10 mg total) by mouth 3 (three) times daily.  . cyclobenzaprine (FLEXERIL) 5 MG tablet Take 5 mg by mouth 3 (three) times daily as needed for muscle spasms.  . diclofenac (VOLTAREN) 75 MG EC tablet Take 1 tablet (75 mg total) by mouth 2 (two) times daily as needed.  . docusate sodium (COLACE) 100 MG capsule Take 1 capsule (100 mg total) by mouth 2 (two) times daily.  Marland Kitchen donepezil (ARICEPT) 10 MG tablet Take 1 tablet (10 mg total) by mouth at bedtime.  Marland Kitchen ibuprofen (ADVIL,MOTRIN) 200 MG tablet Take 200-400 mg by mouth every 6 (six) hours as needed (for pain).  Marland Kitchen levETIRAcetam (KEPPRA) 500 MG tablet Take 1 tablet (500 mg total) by mouth 2 (two) times daily.  Marland Kitchen lisinopril-hydrochlorothiazide (PRINZIDE,ZESTORETIC) 20-25 MG tablet Take 1 tablet by mouth daily.  . memantine (NAMENDA) 5 MG tablet Take 1 tablet (5 mg total) by mouth 2 (two) times daily.  Marland Kitchen omeprazole (PRILOSEC) 20 MG capsule Take 1 capsule (20 mg total) by mouth daily.  Marland Kitchen senna-docusate (SENOKOT-S) 8.6-50 MG tablet Take 1 tablet by mouth daily.  Marland Kitchen trolamine salicylate (ASPER-FLEX) 10 % cream Apply  1 application topically 3 (three) times daily as needed for muscle pain.  Marland Kitchen venlafaxine XR (EFFEXOR XR) 37.5 MG 24 hr capsule Take 1 capsule (37.5 mg total) by mouth daily with breakfast.  . [DISCONTINUED] phenazopyridine (PYRIDIUM) 95 MG tablet Take 1 tablet (95 mg total) by mouth 3 (three) times daily as needed for pain. (Patient not taking: Reported on 08/06/2017)  . [DISCONTINUED] rosuvastatin (CRESTOR) 20 MG tablet Take 1 tablet (20 mg total) by mouth daily.  . [DISCONTINUED] traMADol-acetaminophen (ULTRACET) 37.5-325 MG tablet Take 1 tablet by mouth every 8 (eight) hours as needed. (Patient taking differently: Take 1 tablet by  mouth every 8 (eight) hours as needed (for pain). )   No facility-administered encounter medications on file as of 09/18/2017.      REVIEW OF SYSTEMS  : All other systems reviewed and negative except where noted in the History of Present Illness.   PHYSICAL EXAM: BP (!) 176/104   Pulse 80   Ht 5\' 4"  (1.626 m)   Wt 151 lb 9.6 oz (68.8 kg)   BMI 26.02 kg/m  General: Well developed Nepali male in no acute distress Head: Normocephalic and atraumatic Eyes:  Sclerae anicteric, conjunctiva pink. Ears: Normal auditory acuity Lungs: Clear throughout to auscultation; no increased WOB. Heart: Regular rate and rhythm; no M/R/G. Abdomen: Soft, non-distended.  BS present.  Mild left sided TTP. Rectal:  Will be done at the time of colonoscopy. Musculoskeletal: Symmetrical with no gross deformities  Skin: No lesions on visible extremities Extremities: No edema  Neurological: Alert oriented x 4, grossly non-focal Psychological:  Alert and cooperative. Normal mood and affect  ASSESSMENT AND PLAN: *Screening colonoscopy:  Will schedule with Dr. Havery Moros.  I think that he is fine to proceed.    **The risks, benefits, and alternatives to colonoscopy were discussed with the patient and he consents to proceed.  CC:  [name], MD

## 2017-09-18 NOTE — Progress Notes (Signed)
Agree with assessment and plan as outlined.  

## 2017-09-18 NOTE — Patient Instructions (Signed)

## 2017-10-18 IMAGING — DX DG HAND COMPLETE 3+V*L*
3 series · 3 of 3 positions shown · non-contrast
Comparison: None.

CLINICAL DATA: Amputation of the left ring and long fingers while
working on a long lower today. Initial encounter.

EXAM:
LEFT HAND - COMPLETE 3+ VIEW

[x hand pa left]
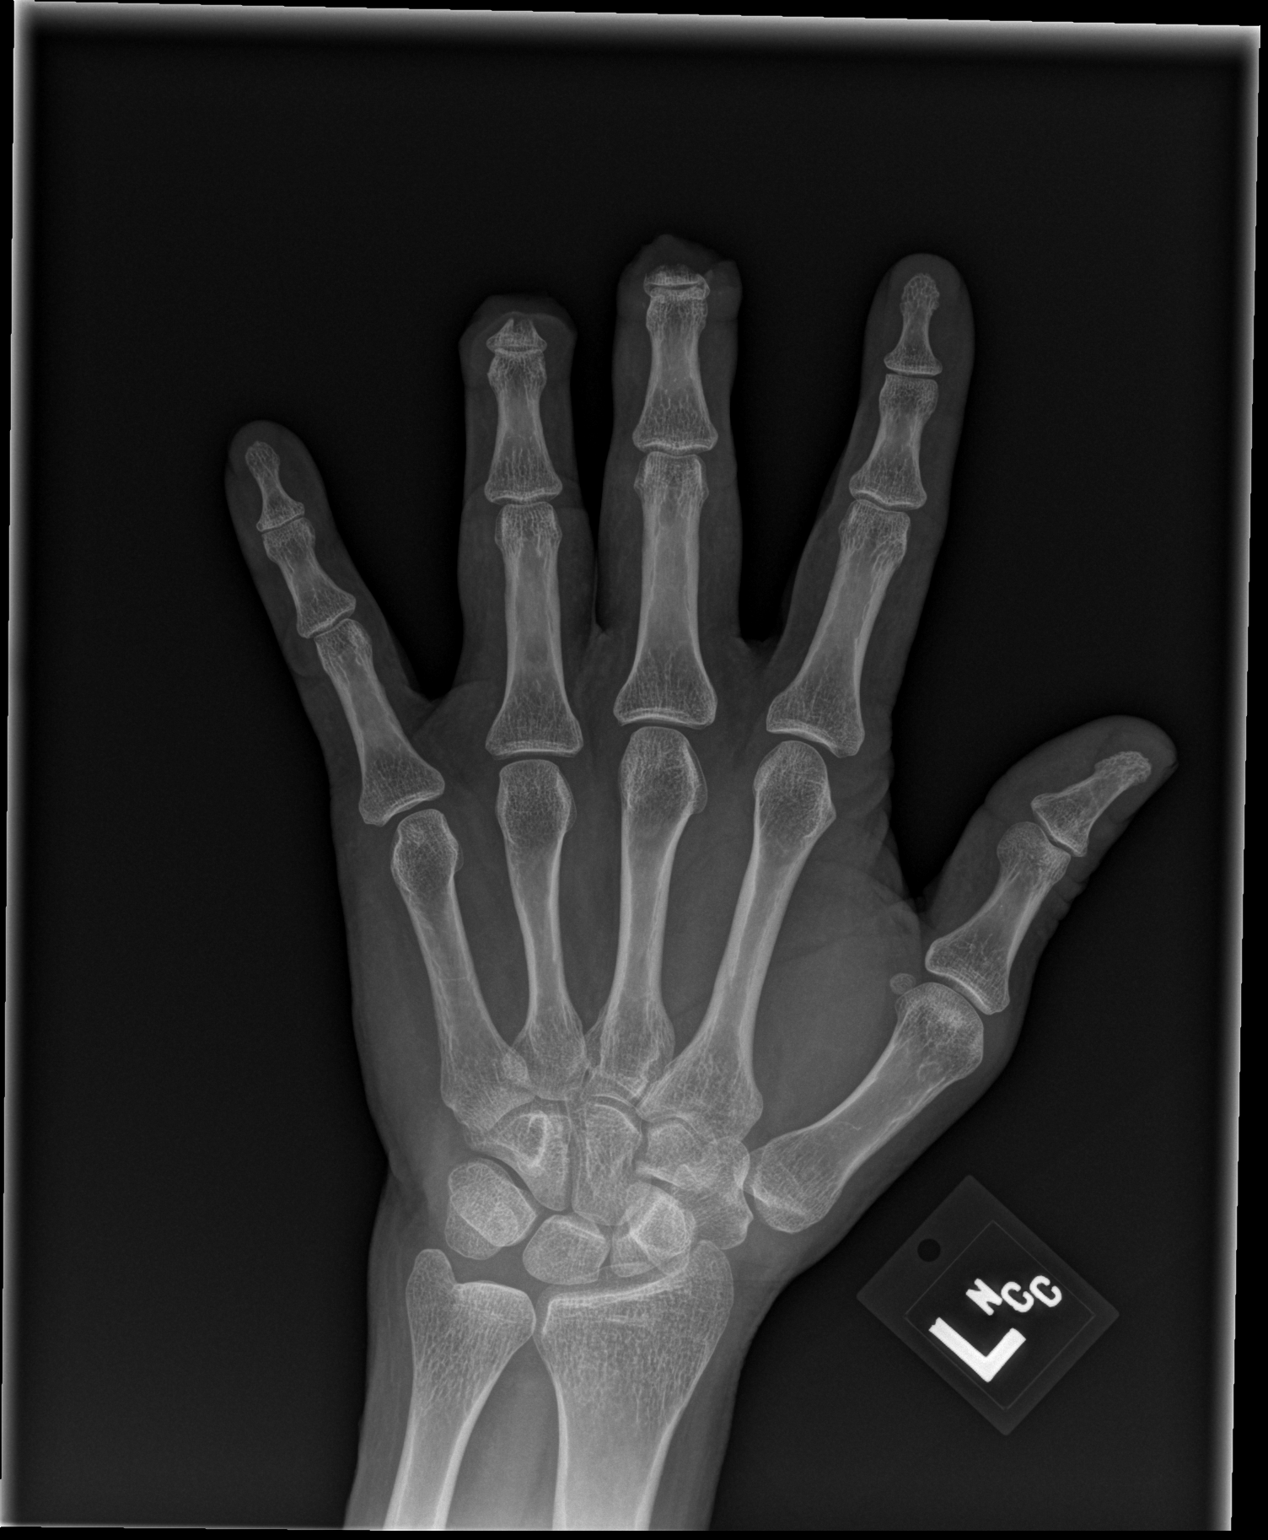

[x hand obl left]
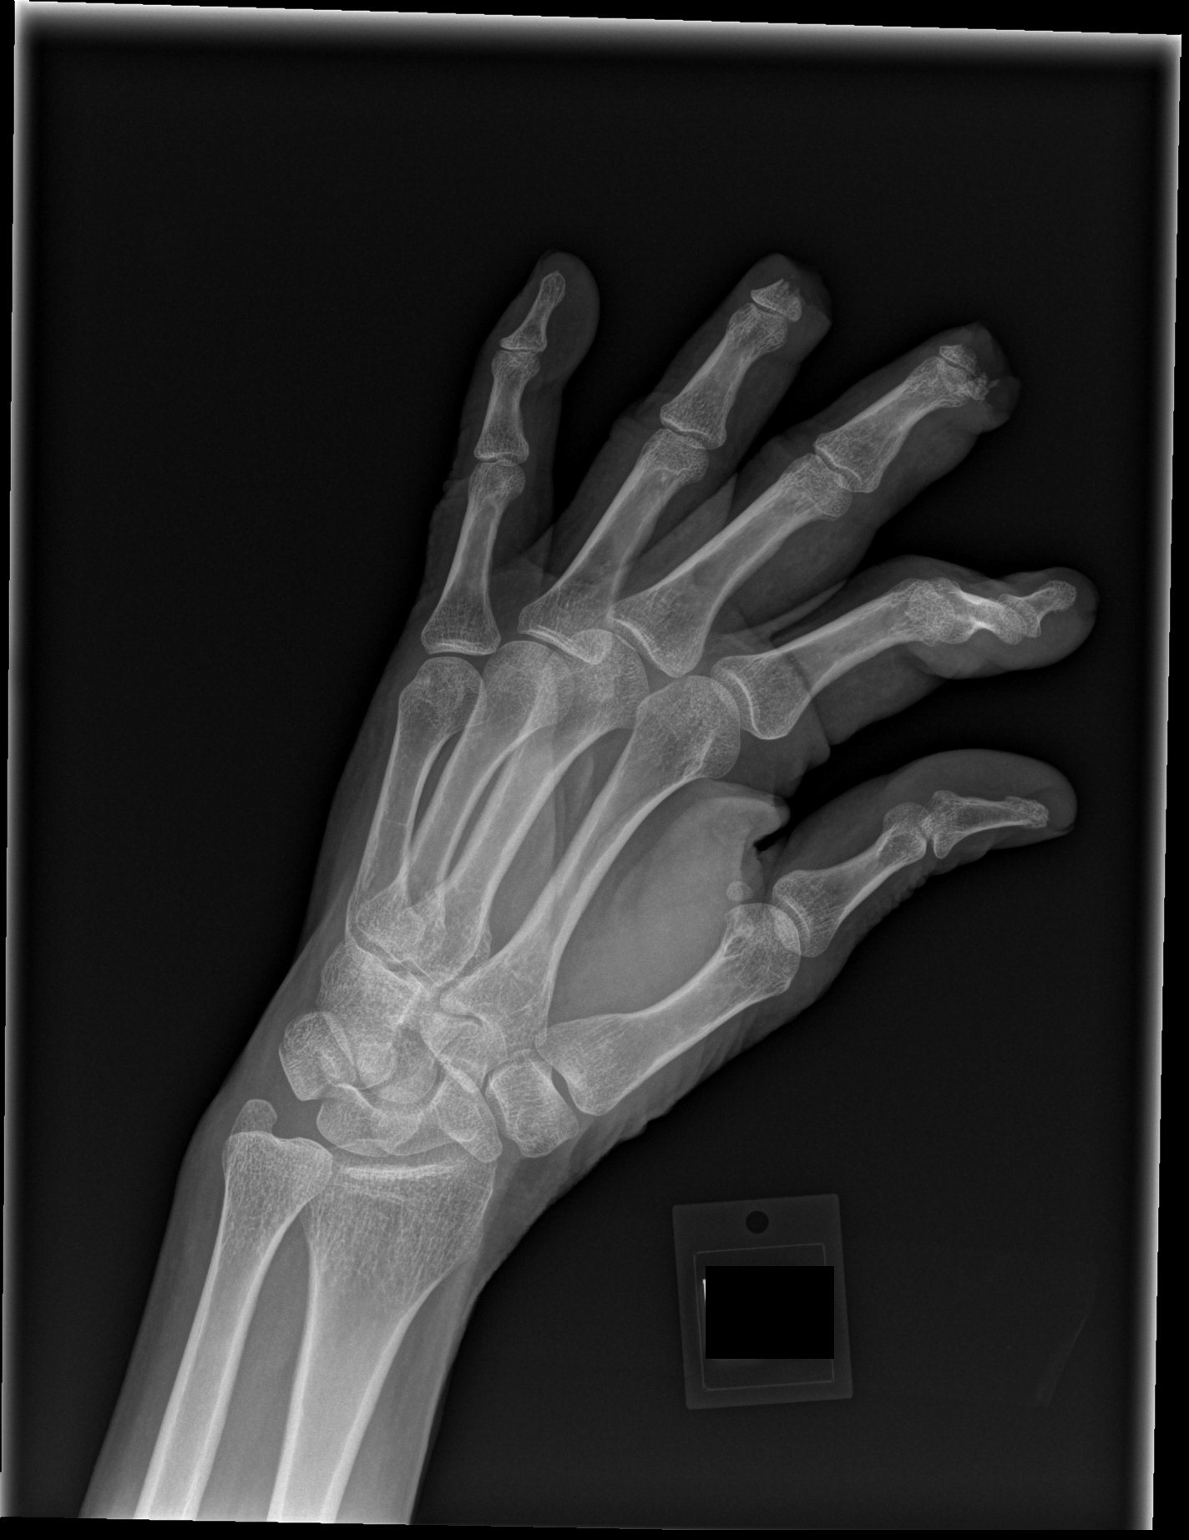

[x hand lat left]
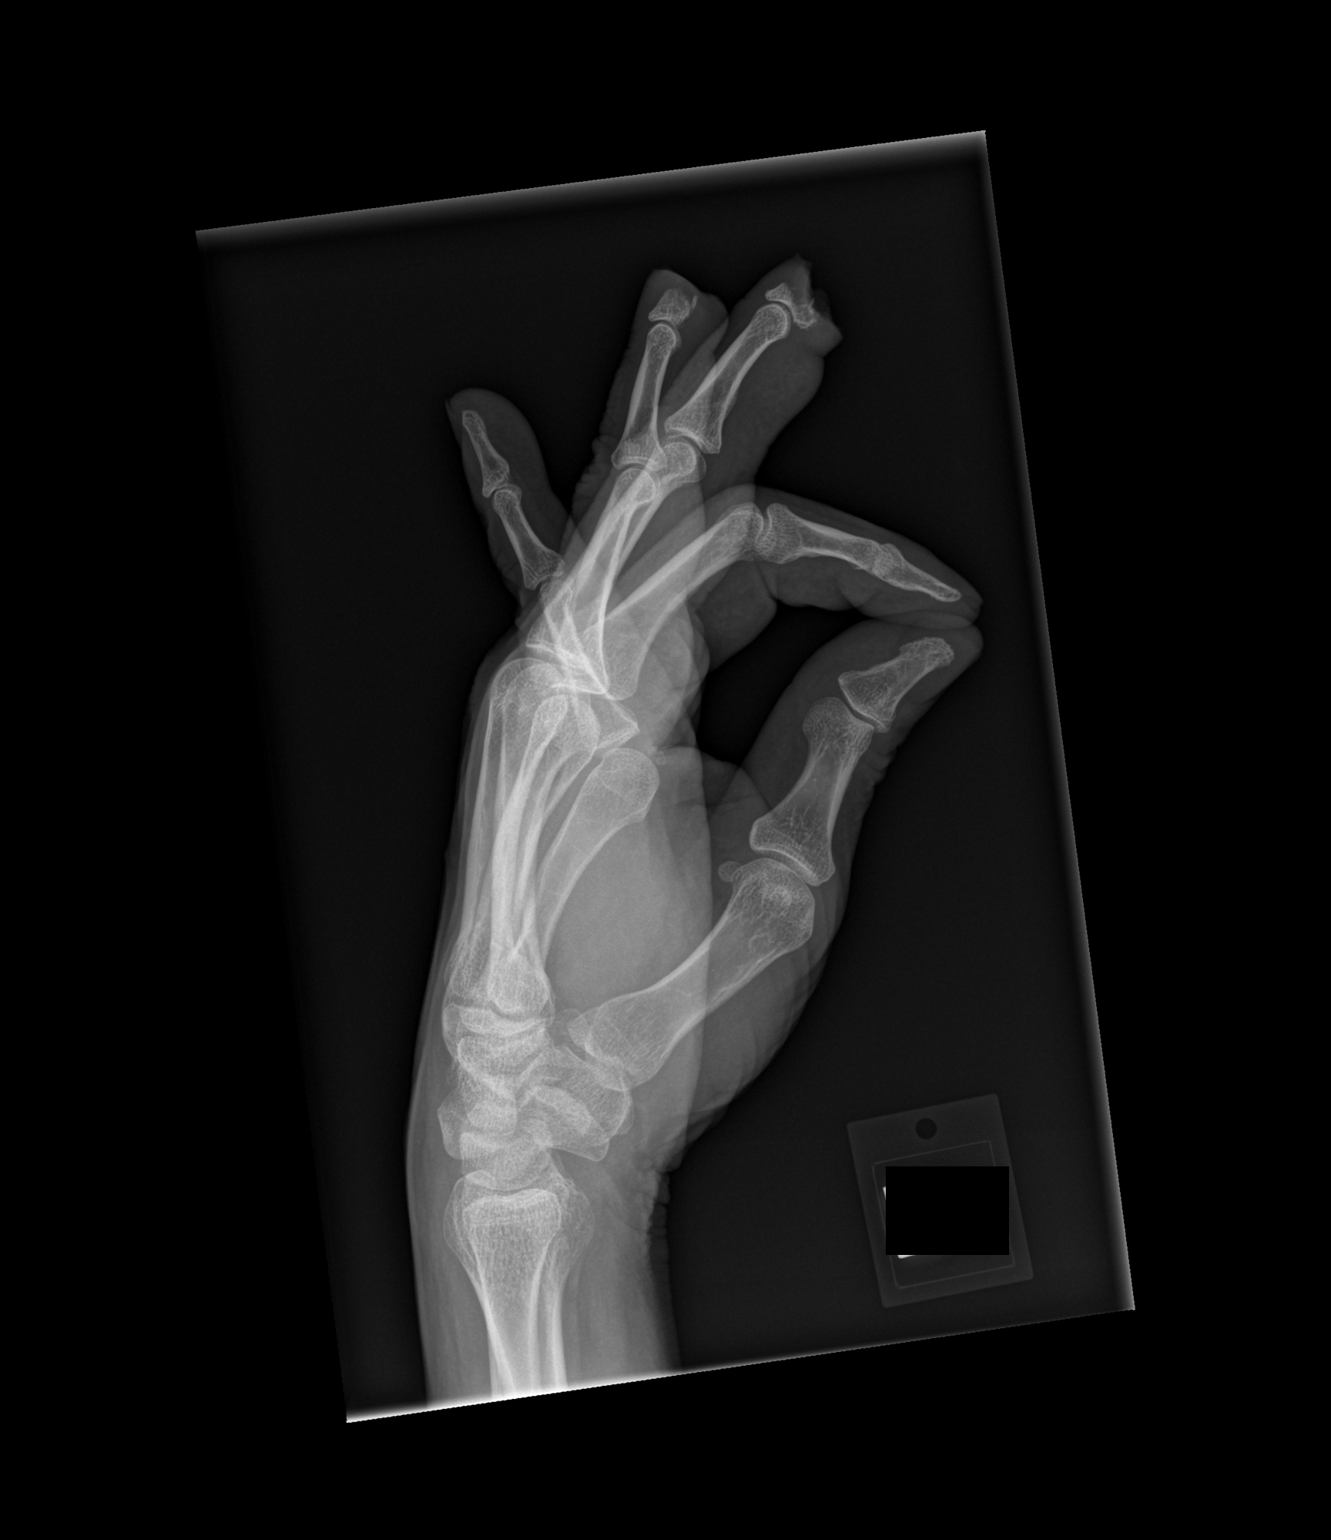

[3 of 3 positions shown; findings below may reference images not displayed]

FINDINGS: The ring and long fingers are amputated at the level of the bases of
the distal phalanges of both fingers. Associated soft tissue wounds
are identified. No radiopaque foreign body is identified.
IMPRESSION: Amputations at the bases of the distal phalanges of the long and
ring fingers of the left hand.

## 2017-11-09 ENCOUNTER — Ambulatory Visit (AMBULATORY_SURGERY_CENTER): Payer: Medicaid Other | Admitting: Gastroenterology

## 2017-11-09 ENCOUNTER — Encounter: Payer: Self-pay | Admitting: Gastroenterology

## 2017-11-09 ENCOUNTER — Other Ambulatory Visit: Payer: Self-pay

## 2017-11-09 VITALS — BP 99/94 | HR 52 | Temp 97.8°F | Resp 12 | Ht 64.0 in | Wt 151.0 lb

## 2017-11-09 DIAGNOSIS — Z1212 Encounter for screening for malignant neoplasm of rectum: Secondary | ICD-10-CM | POA: Diagnosis not present

## 2017-11-09 DIAGNOSIS — Z1211 Encounter for screening for malignant neoplasm of colon: Secondary | ICD-10-CM

## 2017-11-09 MED ORDER — SODIUM CHLORIDE 0.9 % IV SOLN: 500.0000 mL | Freq: Once | INTRAVENOUS | Status: DC

## 2017-11-09 NOTE — Progress Notes (Signed)
To PACU VSS. Report to [email or web address] 

## 2017-11-09 NOTE — Patient Instructions (Addendum)
Impression/Recommendations:  Hemorrhoid handout given to patient.  Resume previous diet. Continue present medications. Recommend Miralax, one capful daily.  Repeat colonoscopy using 2 day prep due to suboptimal bowel preparation.    YOU HAD AN ENDOSCOPIC PROCEDURE TODAY AT [facility]:   Refer to the procedure report that was given to you for any specific questions about what was found during the examination.  If the procedure report does not answer your questions, please call your gastroenterologist to clarify.  If you requested that your care partner not be given the details of your procedure findings, then the procedure report has been included in a sealed envelope for you to review at your convenience later.  YOU SHOULD EXPECT: Some feelings of bloating in the abdomen. Passage of more gas than usual.  Walking can help get rid of the air that was put into your GI tract during the procedure and reduce the bloating. If you had a lower endoscopy (such as a colonoscopy or flexible sigmoidoscopy) you may notice spotting of blood in your stool or on the toilet paper. If you underwent a bowel prep for your procedure, you may not have a normal bowel movement for a few days.  Please Note:  You might notice some irritation and congestion in your nose or some drainage.  This is from the oxygen used during your procedure.  There is no need for concern and it should clear up in a day or so.  SYMPTOMS TO REPORT IMMEDIATELY:   Following lower endoscopy (colonoscopy or flexible sigmoidoscopy):  Excessive amounts of blood in the stool  Significant tenderness or worsening of abdominal pains  Swelling of the abdomen that is new, acute  Fever of 100F or higher For urgent or emergent issues, a gastroenterologist can be reached at any hour by calling [phone].   DIET:  We do recommend a small meal at first, but then you may proceed to your regular diet.  Drink plenty of fluids but you  should avoid alcoholic beverages for 24 hours.  ACTIVITY:  You should plan to take it easy for the rest of today and you should NOT DRIVE or use heavy machinery until tomorrow (because of the sedation medicines used during the test).    FOLLOW UP: Our staff will call the number listed on your records the next business day following your procedure to check on you and address any questions or concerns that you may have regarding the information given to you following your procedure. If we do not reach you, we will leave a message.  However, if you are feeling well and you are not experiencing any problems, there is no need to return our call.  We will assume that you have returned to your regular daily activities without incident.  If any biopsies were taken you will be contacted by phone or by letter within the next 1-3 weeks.  Please call us at [phone] if you have not heard about the biopsies in 3 weeks.    SIGNATURES/CONFIDENTIALITY: You and/or your care partner have signed paperwork which will be entered into your electronic medical record.  These signatures attest to the fact that that the information above on your After Visit Summary has been reviewed and is understood.  Full responsibility of the confidentiality of this discharge information lies with you and/or your care-partner.

## 2017-11-09 NOTE — Op Note (Signed)
[facility] Patient Name: Manuel Reyes Procedure Date: 11/09/2017 1:21 PM MRN: [id number] Endoscopist: Remo Lipps P. Shereen Marton MD, MD Age: 67 Referring MD:  Date of Birth: 1951/02/05 Gender: Male Account #: [id number] Procedure:                Colonoscopy Indications:              Screening for colorectal malignant neoplasm, This                            is the patient's first colonoscopy Medicines:                Monitored Anesthesia Care Procedure:                Pre-Anesthesia Assessment:                           - Prior to the procedure, a History and Physical                            was performed, and patient medications and                            allergies were reviewed. The patient's tolerance of                            previous anesthesia was also reviewed. The risks                            and benefits of the procedure and the sedation                            options and risks were discussed with the patient.                            All questions were answered, and informed consent                            was obtained. Prior Anticoagulants: The patient has                            taken no previous anticoagulant or antiplatelet                            agents. ASA Grade Assessment: II - A patient with                            mild systemic disease. After reviewing the risks                            and benefits, the patient was deemed in                            satisfactory condition to undergo the procedure.  After obtaining informed consent, the colonoscope                            was passed under direct vision. Throughout the                            procedure, the patient's blood pressure, pulse, and                            oxygen saturations were monitored continuously. The                            Model PCF-[id number]) scope was introduced                            through the anus  with the intention of advancing to                            the cecum. The scope was advanced to the transverse                            colon before the procedure was aborted. Medications                            were given. The colonoscopy was technically                            difficult and complex due to inadequate bowel prep.                            The patient tolerated the procedure well. The                            quality of the bowel preparation was inadequate.                            The rectum was photographed. Scope In: 1:23:45 PM Scope Out: 1:27:17 PM Total Procedure Duration: 0 hours 3 minutes 32 seconds  Findings:                 The perianal and digital rectal examinations were                            normal.                           A large amount of semi-solid stool was found in the                            rectum, in the recto-sigmoid colon, in the sigmoid                            colon, in the descending colon and in the distal  transverse colon, making visualization difficult.                            The scope could not transverse the distal                            transverse colon due to stool burden and the                            procedure was aborted. No large mass lesions or                            polyps appreciated.                           Internal hemorrhoids were found during retroflexion. Complications:            No immediate complications. Estimated blood loss:                            None. Estimated Blood Loss:     Estimated blood loss: none. Impression:               - Preparation of the colon was inadequate.                           - Stool in the rectum, in the recto-sigmoid colon,                            in the sigmoid colon, in the descending colon and                            in the distal transverse colon.                           - Internal hemorrhoids.                            - No specimens collected. Recommendation:           - Patient has a contact number available for                            emergencies. The signs and symptoms of potential                            delayed complications were discussed with the                            patient. Return to normal activities tomorrow.                            Written discharge instructions were provided to the                            patient.                           -  Resume previous diet.                           - Continue present medications.                           - Repeat colonoscopy because the bowel preparation                            was suboptimal, using 2 day preparation Tidus Upchurch P. Ameris Akamine MD, MD 11/09/2017 1:33:09 PM This report has been signed electronically.

## 2017-11-09 NOTE — Progress Notes (Signed)
Interpreter used today at the [facility] for this pt.  Interpreter's name is-Dharma Miquel Dunn (pt's son).  Release of responsibility for interpretation form signed and witnessed

## 2017-11-12 ENCOUNTER — Telehealth: Payer: Self-pay

## 2017-11-12 NOTE — Telephone Encounter (Signed)
Left message on answering machine. 

## 2017-11-30 ENCOUNTER — Encounter: Payer: Self-pay | Admitting: Gastroenterology

## 2017-12-21 ENCOUNTER — Encounter: Payer: Self-pay | Admitting: Student

## 2017-12-21 ENCOUNTER — Other Ambulatory Visit: Payer: Self-pay

## 2017-12-21 ENCOUNTER — Ambulatory Visit: Payer: Medicaid Other | Admitting: Student

## 2017-12-21 ENCOUNTER — Ambulatory Visit (AMBULATORY_SURGERY_CENTER): Payer: Self-pay | Admitting: *Deleted

## 2017-12-21 VITALS — Ht 67.0 in | Wt 152.6 lb

## 2017-12-21 VITALS — BP 140/100 | HR 58 | Temp 97.7°F | Wt 151.0 lb

## 2017-12-21 DIAGNOSIS — F0390 Unspecified dementia without behavioral disturbance: Secondary | ICD-10-CM

## 2017-12-21 DIAGNOSIS — Z1211 Encounter for screening for malignant neoplasm of colon: Secondary | ICD-10-CM

## 2017-12-21 DIAGNOSIS — I10 Essential (primary) hypertension: Secondary | ICD-10-CM | POA: Diagnosis present

## 2017-12-21 MED ORDER — POLYETHYLENE GLYCOL 3350 17 GM/SCOOP PO POWD: 1.0000 | Freq: Every day | ORAL | 3 refills | Status: DC

## 2017-12-21 MED ORDER — BISACODYL 5 MG PO TBEC: 5.0000 mg | DELAYED_RELEASE_TABLET | Freq: Every day | ORAL | 0 refills | Status: DC | PRN

## 2017-12-21 MED ORDER — LISINOPRIL-HYDROCHLOROTHIAZIDE 20-25 MG PO TABS: 1.0000 | ORAL_TABLET | Freq: Every day | ORAL | 0 refills | Status: DC

## 2017-12-21 MED ORDER — AMLODIPINE BESYLATE 10 MG PO TABS: 10.0000 mg | ORAL_TABLET | Freq: Every day | ORAL | 0 refills | Status: DC

## 2017-12-21 MED ORDER — BISACODYL 5 MG PO TBEC: DELAYED_RELEASE_TABLET | ORAL | 0 refills | Status: DC

## 2017-12-21 NOTE — Progress Notes (Signed)
Subjective:    Manuel Reyes is a 67 y.o. old male here for medication refills.  He is here with his son, Manuel Reyes who helped as an interpretor.  HPI Mediation refills: he says he likes to get his blood pressure medications filled. He didn't take his medication this morning. Last dose was last night. He says he is out of his medications.  He brought his medication to this visit.  Last refill on his amlodipine and lisinopril/HCTZ was about 6 months ago for 90-day supply.  He is also on diclofenac 75 mg for back pain.  Reports checking his blood pressures at home.  However, he does not know the numbers. Denies chest pain, shortness of breath, dizziness and vision change. He reports chronic headache.  Denies focal neuro symptoms He walks every morning for 30 minutes. Doesn't watch his salt intake. Denies smoking cig but uses chewable tobacco.. Drinks EtOH occ.  Denies recreational drug use. Lives with wife, younger son and his daughter-in-law.  Citizenship application: He has history of dementia.  He is on Namenda and memantine.  He likes to have his N648 form filled for his citizenship application.   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   has a past medical history of Depression, Diabetes ([place]), GERD (gastroesophageal reflux disease), High cholesterol, Hypertension, Neuromuscular disorder ([place]), Seizures ([place]), Stomach ulcer, and Tuberculosis.  FH:  Family History  Problem Relation Age of Onset  .  Seizures Neg Hx   . Dementia Neg Hx   . Colon cancer Neg Hx   . Esophageal cancer Neg Hx   . Rectal cancer Neg Hx   . Stomach cancer Neg Hx     SH Social History   Tobacco Use  . Smoking status: Never Smoker  . Smokeless tobacco: Never Used  Substance Use Topics  . Alcohol use: Yes    Alcohol/week: 0.6 oz    Types: 1 Cans of beer per week    Comment: Beer ocass  . Drug use: No    Review of Systems Review of systems negative except for pertinent positives and negatives in history of present illness above.     Objective:     Vitals:   12/21/17 0927 12/21/17 1003 12/21/17 1004  BP: (!) 160/90 (!) 140/100 (!) 140/100  Pulse: (!) 58    Temp: 97.7 F (36.5 C)    TempSrc: Oral    SpO2: 99%    Weight: 151 lb (68.5 kg)     Body mass index is 25.92 kg/m.  Physical Exam  GEN: appears well, no apparent distress. CVS: RRR, nl s1 & s2, no murmurs, no edema RESP: no IWOB, good air movement bilaterally, CTAB GI: BS present & normal, soft SKIN: no apparent skin lesion ENDO: negative thyromegally    Assessment and Plan:  1. Essential hypertension: Not well controlled.  Likely due to poor compliance with his medication.  He has empty bottles from about 6 months ago.  Refilled his amlodipine and lisinopril/HCT today.  Checking his BMP today.  Recommend taking Tylenol instead of diclofenac or other over-the-counter pain medications.  He does not watch his salt intake either. Discussed about  diet, exercise and salt intake, and gave him handout. This could be challenging to in the setting of his underlying dementia and low literacy. However, he lives with his son and daughter-in-law who could help.  Recommended follow-up with PCP in 1 month. - lisinopril-hydrochlorothiazide (PRINZIDE,ZESTORETIC) 20-25 MG tablet; Take 1 tablet by mouth daily.  Dispense: 90 tablet; Refill: 0 - amLODipine (NORVASC) 10 MG tablet; Take 1 tablet (10 mg total) by mouth daily.  Dispense: 90 tablet; Refill: 0 -  Basic metabolic panel  2.  Dementia/citizenship application AVWP/V-948: He is on Namenda and amantadine.  He is slightly bradycardic today. I gave him the address and phone number to [facility].  I also recommended follow-up at our immigration clinic or with the PCP to have his form completed.  Return in about 2 weeks (around 01/04/2018) for N-648 form in immigrant clinic. Mercy Riding, MD 12/21/17 Pager: [phone]

## 2017-12-21 NOTE — Patient Instructions (Addendum)
It was great seeing you today! We have addressed the following issues today  Blood pressure: Your blood pressure is 140/100.  Your goal blood pressure is less than 140/90.  We refilled your blood pressure medications today.  It is very important that you take your medications daily.  Please avoid over-the-counter pain medications except plain Tylenol.  Continue walking daily.  Please see below for some recommendation and exercise.  Follow-up with your primary care doctor in 1 month.  Citizenship application form: you can either schedule an appointment in our immigration clinic or follow-up with your primary care doctor to discuss about this.  If we did any lab work today, and the results require attention, either me or my nurse will get in touch with you. If everything is normal, you will get a letter in mail and a message via . If you don't hear from [place] in two weeks, please give [place] a call. Otherwise, we look forward to seeing you again at your next visit. If you have any questions or concerns before then, please call the clinic at [phone].  Please bring all your medications to every doctors visit  Sign up for My Chart to have easy access to your labs results, and communication with your Primary care physician.    Please check-out at the front desk before leaving the clinic.    Take Care,   Dr. Cyndia Skeeters  Portion Size   Choose healthier foods such as 100% whole grains, vegetables, fruits, beans, nut seeds, olive oil, most vegetable oils, fat-free dietary, wild game and fish.   Avoid sweet tea, other sweetened beverages, soda, fruit juice, cold cereal and milk and trans fat.   Eat at least 3 meals and 1-2 snacks per day.  Aim for no more than 5 hours between eating.  Eat breakfast within one hour of getting up.    Exercise at least 150 minutes per week, including weight resistance exercises 3 or 4 times per week.   Try to lose at least 7-10% of your current body  weight.   Limit your salt (Sodium) intake to less than 2 gm (2000 mg) a day if you have conditions such as  elevated blood pressure, heart failure...    You may also read about DASH and/or Mediterranean diet at the following web site if you have blood pressure or heart condition. [email or web address].se LACodes.co.za

## 2017-12-21 NOTE — Progress Notes (Signed)
No egg or soy allergy known to patient  No issues with past sedation with any surgeries  or procedures, no intubation problems  No diet pills per patient No home 02 use per patient  No blood thinners per patient  Pt denies issues with constipation yes is better with medication  Will need 2 day prep. No A fib or A flutter  EMMI video sent to pt's e mail  Was here in January no email.    Interpreter was here with patient.  Patient did not understand instructions at all.  His son did not come with him.  Casimer Leek (interpreter) will have son come to her home and she will go over the entire instructions with son.  She is neighbors with the son and patient.  I went over the instructions in great detail and answered all questions the interpreter had regarding this.  I advised her to call or son to call Monday if they have any further questions.  She agreed to do so.  Elizabeth Palau, CMA  PV

## 2017-12-22 LAB — BASIC METABOLIC PANEL
BUN/Creatinine Ratio: 12 (ref 10–24)
BUN: 11 mg/dL (ref 8–27)
CHLORIDE: 101 mmol/L (ref 96–106)
CO2: 23 mmol/L (ref 20–29)
Calcium: 9.9 mg/dL (ref 8.6–10.2)
Creatinine, Ser: 0.91 mg/dL (ref 0.76–1.27)
GFR, EST AFRICAN AMERICAN: 102 mL/min/{1.73_m2} (ref >59)
GFR, EST NON AFRICAN AMERICAN: 88 mL/min/{1.73_m2} (ref >59)
Glucose: 95 mg/dL (ref 65–99)
Potassium: 4.7 mmol/L (ref 3.5–5.2)
Sodium: 141 mmol/L (ref 134–144)

## 2017-12-23 ENCOUNTER — Encounter: Payer: Self-pay | Admitting: Student

## 2017-12-28 ENCOUNTER — Other Ambulatory Visit: Payer: Self-pay

## 2017-12-28 ENCOUNTER — Ambulatory Visit (AMBULATORY_SURGERY_CENTER): Payer: Medicaid Other | Admitting: Gastroenterology

## 2017-12-28 ENCOUNTER — Encounter: Payer: Self-pay | Admitting: Gastroenterology

## 2017-12-28 VITALS — BP 135/99 | HR 80 | Temp 98.0°F | Resp 23 | Ht 67.0 in | Wt 152.0 lb

## 2017-12-28 DIAGNOSIS — D122 Benign neoplasm of ascending colon: Secondary | ICD-10-CM

## 2017-12-28 DIAGNOSIS — Z1211 Encounter for screening for malignant neoplasm of colon: Secondary | ICD-10-CM | POA: Diagnosis not present

## 2017-12-28 DIAGNOSIS — D120 Benign neoplasm of cecum: Secondary | ICD-10-CM

## 2017-12-28 MED ORDER — DEXTROSE 5 % IV SOLN: INTRAVENOUS | Status: DC

## 2017-12-28 MED ORDER — SODIUM CHLORIDE 0.9 % IV SOLN: 500.0000 mL | Freq: Once | INTRAVENOUS | Status: DC

## 2017-12-28 NOTE — Progress Notes (Signed)
Pt speaks [employer].  He has an interpreter with him today, Morrell Riddle.  She is with [employer].  Pt's blood sugar was 78 on admission.  Pt denied any symptoms of low sugar.  Piggyback D5W into NaChl 0.9%.Iv after IV was started, he was given a slow drip of D5W.  maw     Pt's states no medical or surgical changes since previsit or office visit. Maw   Pt's sugar was rechecked after 15 min - 125 ml D5W in, sugar was 114. Pt nonsymptomatic. maw

## 2017-12-28 NOTE — Progress Notes (Signed)
Called to room to assist during endoscopic procedure.  Patient ID and intended procedure confirmed with present staff. Received instructions for my participation in the procedure from the performing physician.  

## 2017-12-28 NOTE — Op Note (Signed)
Manuel Reyes: Manuel Reyes Procedure Date: 12/28/2017 3:50 PM MRN: [id number] Endoscopist: Remo Lipps P. Armbruster MD, MD Age: 67 Referring MD:  Date of Birth: 1950/12/08 Gender: Male Account #: [id number] Procedure:                Colonoscopy Indications:              Screening for colorectal malignant neoplasm Medicines:                Monitored Anesthesia Care Procedure:                Pre-Anesthesia Assessment:                           - Prior to the procedure, a History and Physical                            was performed, and patient medications and                            allergies were reviewed. The patient's tolerance of                            previous anesthesia was also reviewed. The risks                            and benefits of the procedure and the sedation                            options and risks were discussed with the patient.                            All questions were answered, and informed consent                            was obtained. Prior Anticoagulants: The patient has                            taken no previous anticoagulant or antiplatelet                            agents. ASA Grade Assessment: III - A patient with                            severe systemic disease. After reviewing the risks                            and benefits, the patient was deemed in                            satisfactory condition to undergo the procedure.                           After obtaining informed consent, the colonoscope  was passed under direct vision. Throughout the                            procedure, the patient's blood pressure, pulse, and                            oxygen saturations were monitored continuously. The                            Colonoscope was introduced through the anus and                            advanced to the the cecum, identified by                            appendiceal orifice  and ileocecal valve. The                            colonoscopy was technically difficult and complex                            due to a tortuous colon. The patient tolerated the                            procedure well. The quality of the bowel                            preparation was adequate. The ileocecal valve,                            appendiceal orifice, and rectum were photographed. Scope In: 4:02:01 PM Scope Out: 4:24:24 PM Scope Withdrawal Time: 0 hours 19 minutes 23 seconds  Total Procedure Duration: 0 hours 22 minutes 23 seconds  Findings:                 The perianal and digital rectal examinations were                            normal.                           Two sessile polyps were found in the cecum. The                            polyps were 2 to 3 mm in size. These polyps were                            removed with a cold snare. Resection and retrieval                            were complete.                           A 4 mm polyp was found in the ascending colon. The  polyp was sessile. The polyp was removed with a                            cold snare. Resection and retrieval were complete.                           The colon was tortuous.                           Internal hemorrhoids were found during                            retroflexion. The hemorrhoids were small.                           The exam was otherwise without abnormality. Complications:            No immediate complications. Estimated blood loss:                            Minimal. Estimated Blood Loss:     Estimated blood loss was minimal. Impression:               - Two 2 to 3 mm polyps in the cecum, removed with a                            cold snare. Resected and retrieved.                           - One 4 mm polyp in the ascending colon, removed                            with a cold snare. Resected and retrieved.                           - Tortuous  colon.                           - Internal hemorrhoids.                           - The examination was otherwise normal. Recommendation:           - Patient has a contact number available for                            emergencies. The signs and symptoms of potential                            delayed complications were discussed with the                            patient. Return to normal activities tomorrow.                            Written discharge instructions were provided to the  patient.                           - Resume previous diet.                           - Continue present medications.                           - Await pathology results.                           - Repeat colonoscopy is recommended for                            surveillance. The colonoscopy date will be                            determined after pathology results from today's                            exam become available for review. Remo Lipps P. Armbruster MD, MD 12/28/2017 4:27:21 PM This report has been signed electronically.

## 2017-12-28 NOTE — Progress Notes (Signed)
To recovery, report to RN, VSS. 

## 2017-12-28 NOTE — Patient Instructions (Signed)
YOU HAD AN ENDOSCOPIC PROCEDURE TODAY AT [facility]:   Refer to the procedure report that was given to you for any specific questions about what was found during the examination.  If the procedure report does not answer your questions, please call your gastroenterologist to clarify.  If you requested that your care partner not be given the details of your procedure findings, then the procedure report has been included in a sealed envelope for you to review at your convenience later.  YOU SHOULD EXPECT: Some feelings of bloating in the abdomen. Passage of more gas than usual.  Walking can help get rid of the air that was put into your GI tract during the procedure and reduce the bloating. If you had a lower endoscopy (such as a colonoscopy or flexible sigmoidoscopy) you may notice spotting of blood in your stool or on the toilet paper. If you underwent a bowel prep for your procedure, you may not have a normal bowel movement for a few days.  Please Note:  You might notice some irritation and congestion in your nose or some drainage.  This is from the oxygen used during your procedure.  There is no need for concern and it should clear up in a day or so.  SYMPTOMS TO REPORT IMMEDIATELY:   Following lower endoscopy (colonoscopy or flexible sigmoidoscopy):  Excessive amounts of blood in the stool  Significant tenderness or worsening of abdominal pains  Swelling of the abdomen that is new, acute  Fever of 100F or higher  Please see all handouts to you on Polyps and Hemorrhoids. For urgent or emergent issues, a gastroenterologist can be reached at any hour by calling [phone].   DIET:  We do recommend a small meal at first, but then you may proceed to your regular diet.  Drink plenty of fluids but you should avoid alcoholic beverages for 24 hours.  ACTIVITY:  You should plan to take it easy for the rest of today and you should NOT DRIVE or use heavy machinery until tomorrow  (because of the sedation medicines used during the test).    FOLLOW UP: Our staff will call the number listed on your records the next business day following your procedure to check on you and address any questions or concerns that you may have regarding the information given to you following your procedure. If we do not reach you, we will leave a message.  However, if you are feeling well and you are not experiencing any problems, there is no need to return our call.  We will assume that you have returned to your regular daily activities without incident.  If any biopsies were taken you will be contacted by phone or by letter within the next 1-3 weeks.  Please call us at [phone] if you have not heard about the biopsies in 3 weeks.    SIGNATURES/CONFIDENTIALITY: You and/or your care partner have signed paperwork which will be entered into your electronic medical record.  These signatures attest to the fact that that the information above on your After Visit Summary has been reviewed and is understood.  Full responsibility of the confidentiality of this discharge information lies with you and/or your care-partner.  Thank you for letting us take care of your healthcare needs today.

## 2017-12-31 ENCOUNTER — Telehealth: Payer: Self-pay | Admitting: *Deleted

## 2017-12-31 ENCOUNTER — Telehealth: Payer: Self-pay

## 2017-12-31 NOTE — Telephone Encounter (Signed)
No answer, message left for the patient. 

## 2017-12-31 NOTE — Telephone Encounter (Signed)
Left message on answering machine. 

## 2018-01-04 ENCOUNTER — Encounter: Payer: Self-pay | Admitting: Gastroenterology

## 2018-03-29 ENCOUNTER — Other Ambulatory Visit: Payer: Self-pay

## 2018-03-29 ENCOUNTER — Ambulatory Visit: Payer: Medicaid Other | Admitting: Family Medicine

## 2018-03-29 ENCOUNTER — Encounter: Payer: Self-pay | Admitting: Family Medicine

## 2018-03-29 VITALS — BP 148/96 | HR 69 | Temp 98.3°F | Ht 67.0 in | Wt 154.0 lb

## 2018-03-29 DIAGNOSIS — I10 Essential (primary) hypertension: Secondary | ICD-10-CM | POA: Diagnosis present

## 2018-03-29 DIAGNOSIS — G8929 Other chronic pain: Secondary | ICD-10-CM | POA: Diagnosis not present

## 2018-03-29 DIAGNOSIS — F32A Depression, unspecified: Secondary | ICD-10-CM | POA: Insufficient documentation

## 2018-03-29 DIAGNOSIS — M5442 Lumbago with sciatica, left side: Secondary | ICD-10-CM | POA: Diagnosis not present

## 2018-03-29 DIAGNOSIS — F329 Major depressive disorder, single episode, unspecified: Secondary | ICD-10-CM | POA: Insufficient documentation

## 2018-03-29 MED ORDER — DONEPEZIL HCL 10 MG PO TABS: 10.0000 mg | ORAL_TABLET | Freq: Every day | ORAL | 12 refills | Status: DC

## 2018-03-29 MED ORDER — MEMANTINE HCL 5 MG PO TABS: 5.0000 mg | ORAL_TABLET | Freq: Two times a day (BID) | ORAL | 11 refills | Status: DC

## 2018-03-29 MED ORDER — GABAPENTIN 100 MG PO CAPS: 100.0000 mg | ORAL_CAPSULE | Freq: Three times a day (TID) | ORAL | 3 refills | Status: DC

## 2018-03-29 MED ORDER — CYCLOBENZAPRINE HCL 5 MG PO TABS: 5.0000 mg | ORAL_TABLET | Freq: Three times a day (TID) | ORAL | 1 refills | Status: DC | PRN

## 2018-03-29 MED ORDER — DICLOFENAC SODIUM 75 MG PO TBEC: 75.0000 mg | DELAYED_RELEASE_TABLET | Freq: Two times a day (BID) | ORAL | 1 refills | Status: DC | PRN

## 2018-03-29 NOTE — Patient Instructions (Addendum)
It was great meeting you today Manuel Reyes. We discussed your back pain and I sent in refills for voltaren and flexeril. On top of these medications, I think adding a low dose of gabapentin will help with the burning pain in your leg.  We checked your blood pressure and it was 148/96. Your goal is 140/90. I rechecked this later and it was 127/84 which is well within the goal.  Regarding your depression, it seems like this is from chronic medical problems. It seems that you did not want to receive treatment for this today. Will follow at subsequent visits.

## 2018-04-01 ENCOUNTER — Telehealth: Payer: Self-pay

## 2018-04-01 NOTE — Telephone Encounter (Signed)
Received fax from [place] requesting prior authorization of diclofenac .  Form placed in MD's box for completion along with Medicaid formulary.  Wallace Cullens, RN

## 2018-04-05 NOTE — Assessment & Plan Note (Signed)
Initially elevated but decreased on recheck. This is consistent with past exams as well per chart review. Likely has "white coat hypertension".  - continue lisinopril/hctz at 20/25mg  - continue norvasc 10mg 

## 2018-04-05 NOTE — Progress Notes (Signed)
  Patient's son translated.  HPI 67 year old who presents with chronic lower back pain. It is much worse on the left side. It is described as a sharp pain that occasionally radiates. Has symptoms consistent with sciatica as well. Unclear etiolgy as there is no previous imaging but seems consistent with likely arthritis changes to back. Treated previously with flexeril and voltaren which did provide great relief.  Pate initially with blood pressure of 148/96. I rechecked this pressure 20 minutes later and it was 127/84.   Patient's son asking for refills for namena, memantine, and lisinopril/hctz.   CC: back pain   ROS:  Review of Systems See HPI for ROS.   CC, SH/smoking status, and VS noted  Objective: BP (!) 148/96   Pulse 69   Temp 98.3 F (36.8 C) (Oral)   Ht 5\' 7"  (1.702 m)   Wt 154 lb (69.9 kg)   SpO2 97%   BMI 24.12 kg/m  Gen: NAD, alert, cooperative, and pleasant. Elderly [place] male resting comfortably on exam table HEENT: NCAT, EOMI, PERRL CV: RRR, no murmur Resp: CTAB, no wheezes, non-labored Abd: SNTND, BS present, no guarding or organomegaly Ext: No edema, warm Neuro: Alert and oriented, Speech clear, No gross deficits. Positive straight leg test on left side for sciatica. No neuro deficits Back: tenderness to palpation in left lower back    Assessment and plan:  Essential hypertension Initially elevated but decreased on recheck. This is consistent with past exams as well per chart review. Likely has "white coat hypertension".  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 Will refill flexeril and voltaren. Advised that will likely need to pull back voltaren after short course due to long term side-effects. Did start neurontin due to neuropathy symptoms.   No orders of the defined types were placed in this encounter.   Meds ordered  this encounter  Medications  . cyclobenzaprine (FLEXERIL) 5 MG tablet    Sig: Take 1 tablet (5 mg total) by mouth 3 (three) times daily as needed for muscle spasms.    Dispense:  30 tablet    Refill:  1  . diclofenac (VOLTAREN) 75 MG EC tablet    Sig: Take 1 tablet (75 mg total) by mouth 2 (two) times daily as needed.    Dispense:  30 tablet    Refill:  1  . gabapentin (NEURONTIN) 100 MG capsule    Sig: Take 1 capsule (100 mg total) by mouth 3 (three) times daily.    Dispense:  90 capsule    Refill:  3  . donepezil (ARICEPT) 10 MG tablet    Sig: Take 1 tablet (10 mg total) by mouth at bedtime.    Dispense:  30 tablet    Refill:  12  . memantine (NAMENDA) 5 MG tablet    Sig: Take 1 tablet (5 mg total) by mouth 2 (two) times daily.    Dispense:  60 tablet    Refill:  11     Guadalupe Dawn MD PGY-1 Family Medicine Resident  04/05/2018 8:23 AM

## 2018-04-05 NOTE — Telephone Encounter (Signed)
Prior approval for Diclofenac sodium completed via [employer].  Med approved for 04/05/18 - 10/02/18  Prior approval # [id number].  [facility] pharmacy informed.  Danley Danker, RN [facility] RN)

## 2018-04-05 NOTE — Assessment & Plan Note (Signed)
Chronic. 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 Will refill flexeril and voltaren. Advised that will likely need to pull back voltaren after short course due to long term side-effects. Did start neurontin due to neuropathy symptoms.

## 2018-05-10 ENCOUNTER — Telehealth: Payer: Self-pay

## 2018-05-10 NOTE — Telephone Encounter (Signed)
[facility] called nurse line stating they need an update med list faxed to their pharmacy. Pt has switched to them. Fax number [phone]. 

## 2018-06-17 ENCOUNTER — Other Ambulatory Visit: Payer: Self-pay | Admitting: *Deleted

## 2018-06-17 DIAGNOSIS — I10 Essential (primary) hypertension: Secondary | ICD-10-CM

## 2018-06-18 MED ORDER — DOCUSATE SODIUM 100 MG PO CAPS: 100.0000 mg | ORAL_CAPSULE | Freq: Two times a day (BID) | ORAL | 0 refills | Status: DC

## 2018-06-18 MED ORDER — AMLODIPINE BESYLATE 10 MG PO TABS: 10.0000 mg | ORAL_TABLET | Freq: Every day | ORAL | 0 refills | Status: DC

## 2018-06-18 MED ORDER — CYCLOBENZAPRINE HCL 5 MG PO TABS: 5.0000 mg | ORAL_TABLET | Freq: Three times a day (TID) | ORAL | 1 refills | Status: DC | PRN

## 2018-06-18 MED ORDER — LEVETIRACETAM 500 MG PO TABS: 500.0000 mg | ORAL_TABLET | Freq: Two times a day (BID) | ORAL | 11 refills | Status: DC

## 2018-06-24 ENCOUNTER — Other Ambulatory Visit: Payer: Self-pay

## 2018-06-24 MED ORDER — DOCUSATE SODIUM 100 MG PO CAPS: 100.0000 mg | ORAL_CAPSULE | Freq: Two times a day (BID) | ORAL | 0 refills | Status: DC

## 2018-06-24 MED ORDER — LEVETIRACETAM 500 MG PO TABS: 500.0000 mg | ORAL_TABLET | Freq: Two times a day (BID) | ORAL | 11 refills | Status: DC

## 2018-06-26 ENCOUNTER — Other Ambulatory Visit: Payer: Self-pay

## 2018-06-26 DIAGNOSIS — E785 Hyperlipidemia, unspecified: Secondary | ICD-10-CM

## 2018-06-26 DIAGNOSIS — K2100 Gastro-esophageal reflux disease with esophagitis, without bleeding: Secondary | ICD-10-CM

## 2018-06-26 DIAGNOSIS — F411 Generalized anxiety disorder: Secondary | ICD-10-CM

## 2018-06-27 MED ORDER — SENNOSIDES-DOCUSATE SODIUM 8.6-50 MG PO TABS: 1.0000 | ORAL_TABLET | Freq: Every day | ORAL | 1 refills | Status: DC

## 2018-06-27 MED ORDER — OMEPRAZOLE 20 MG PO CPDR: 20.0000 mg | DELAYED_RELEASE_CAPSULE | Freq: Every day | ORAL | 1 refills | Status: DC

## 2018-06-27 MED ORDER — VENLAFAXINE HCL ER 37.5 MG PO CP24
37.5000 mg | ORAL_CAPSULE | Freq: Every day | ORAL | 1 refills | Status: DC
Start: 2018-06-27 — End: 2019-05-26

## 2018-06-27 MED ORDER — ROSUVASTATIN CALCIUM 20 MG PO TABS: 20.0000 mg | ORAL_TABLET | Freq: Every day | ORAL | 3 refills | Status: DC

## 2018-07-22 ENCOUNTER — Other Ambulatory Visit: Payer: Self-pay | Admitting: Family Medicine

## 2018-07-27 ENCOUNTER — Other Ambulatory Visit: Payer: Self-pay | Admitting: Family Medicine

## 2018-08-14 ENCOUNTER — Other Ambulatory Visit: Payer: Self-pay | Admitting: Gastroenterology

## 2018-08-14 ENCOUNTER — Other Ambulatory Visit: Payer: Self-pay | Admitting: Family Medicine

## 2018-08-14 DIAGNOSIS — I10 Essential (primary) hypertension: Secondary | ICD-10-CM

## 2018-08-15 ENCOUNTER — Other Ambulatory Visit: Payer: Self-pay

## 2018-08-15 MED ORDER — DOCUSATE SODIUM 100 MG PO CAPS: 100.0000 mg | ORAL_CAPSULE | Freq: Two times a day (BID) | ORAL | 2 refills | Status: DC

## 2018-10-11 ENCOUNTER — Ambulatory Visit: Payer: Medicaid Other | Admitting: Family Medicine

## 2018-10-11 ENCOUNTER — Other Ambulatory Visit: Payer: Self-pay

## 2018-10-11 DIAGNOSIS — R634 Abnormal weight loss: Secondary | ICD-10-CM

## 2018-10-11 HISTORY — DX: Abnormal weight loss: R63.4

## 2018-10-11 NOTE — Patient Instructions (Signed)
It was nice meeting you today Mr. Manuel Reyes!   If you have any questions or concerns, please feel free to call the clinic.   Be well,  Dr. Shan Levans

## 2018-10-11 NOTE — Progress Notes (Signed)
Subjective:    Manuel Reyes - 67 y.o. male MRN [id number]  Date of birth: 07/05/1951  CC:  [employer] is here for abdominal symptoms.  HPI:  Abdominal symptoms - loss of appetite, bloating, feels constipated - this has been occurring for the last two months - feels that he has lost weight over the last two months - his stools are dark in color but not black, and his bowel movements are small in quantity but occur once per day - eats once per day, but says it is difficult to digest due to bloating - cannot associate any particular foods with worsening bloating - no other exacerbating factors - has tried omeprazole, but this is not working - denies abdominal pain, but he does have heartburn and frequent burping - says that he is not sleeping well because he feels hot, but denies fevers or night sweats   Health Maintenance:  Health Maintenance Due  Topic Date Due  . OPHTHALMOLOGY EXAM  10/30/1962  . PNA vac Low Risk Adult (1 of 2 - PCV13) 10/30/2017  . INFLUENZA VACCINE  05/30/2018    -  reports that he has never smoked. He has never used smokeless tobacco. - Review of Systems: Per HPI. - Past Medical History: Patient Active Problem List   Diagnosis Date Noted  . Abnormal weight loss 10/11/2018  . Chronic left-sided low back pain with left-sided sciatica 03/29/2018  . Depression 03/29/2018  . Special screening for malignant neoplasms, colon 09/18/2017  . Back pain 07/31/2017  . Trochanteric bursitis of right hip 06/03/2017  . Constipation 03/07/2017  . Dysuria 08/23/2016  . Flank pain 07/28/2016  . Dementia ([place]) 06/13/2016  . 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 Dry skin 05/09/2016  . Awareness alteration, transient 01/21/2016  . Neuropathy 01/21/2016  . Frequent falls 12/01/2015  . Seizure-like activity ([place]) 12/01/2015  . Memory difficulties 10/30/2015  . Chronic diastolic heart failure ([place]) 10/28/2015  . PND (paroxysmal nocturnal  dyspnea) 05/21/2015  . Cephalalgia 04/16/2015  . Hyperlipidemia 04/15/2015  . Essential hypertension 03/18/2015  . Back muscle spasm 03/18/2015  . GERD (gastroesophageal reflux disease) 03/18/2015   - Medications: reviewed and updated   Objective:   Physical Exam BP (!) 146/78   Pulse 72   Temp 98 F (36.7 C) (Oral)   Wt 141 lb (64 kg)   SpO2 99%   BMI 22.08 kg/m  Gen: NAD, alert, cooperative with exam, elderly-appearing HEENT: NCAT, PERRL, clear conjunctiva without scleral icterus, oropharynx clear, supple neck CV: RRR, good S1/S2, no murmur Resp: CTABL, no wheezes, non-labored Abd: Mildly tender to palpation in the left and right lower quadrants, liver palpated only on inhalation, BS present, no guarding or organomegaly Skin: no rashes, normal turgor  Neuro: no gross deficits.      Assessment & Plan:   Abnormal weight loss Concerned that patient's weight loss of 13 pounds since May 2019 along with his symptoms of bloating, dyspepsia, and constipation could be due to malignancy or peptic ulcer disease.  Also less likely due to lack of improvement with omeprazole and lack of melanotic stools, but he does have a history of gastric ulcer disease.  Reassuringly, colonoscopy performed earlier this year was negative for malignancy of the colon.  Will obtain CBC with differential, CMP, order a CT abdomen/pelvis with contrast, and place a referral for GI for possible endoscopy.    Maia Breslow, M.D. 10/11/2018, 6:03 PM PGY-2, [place]

## 2018-10-11 NOTE — Assessment & Plan Note (Signed)
Concerned that patient's weight loss of 13 pounds since May 2019 along with his symptoms of bloating, dyspepsia, and constipation could be due to malignancy or peptic ulcer disease.  Also less likely due to lack of improvement with omeprazole and lack of melanotic stools, but he does have a history of gastric ulcer disease.  Reassuringly, colonoscopy performed earlier this year was negative for malignancy of the colon.  Will obtain CBC with differential, CMP, order a CT abdomen/pelvis with contrast, and place a referral for GI for possible endoscopy.

## 2018-10-12 LAB — COMPREHENSIVE METABOLIC PANEL
ALBUMIN: 4.7 g/dL (ref 3.6–4.8)
ALK PHOS: 130 IU/L — AB (ref 39–117)
ALT: 15 IU/L (ref 0–44)
AST: 25 IU/L (ref 0–40)
Albumin/Globulin Ratio: 1.4 (ref 1.2–2.2)
BILIRUBIN TOTAL: 0.4 mg/dL (ref 0.0–1.2)
BUN/Creatinine Ratio: 12 (ref 10–24)
BUN: 9 mg/dL (ref 8–27)
CO2: 24 mmol/L (ref 20–29)
Calcium: 9.8 mg/dL (ref 8.6–10.2)
Chloride: 103 mmol/L (ref 96–106)
Creatinine, Ser: 0.76 mg/dL (ref 0.76–1.27)
GFR calc Af Amer: 111 mL/min/{1.73_m2} (ref >59)
GFR calc non Af Amer: 96 mL/min/{1.73_m2} (ref >59)
GLOBULIN, TOTAL: 3.3 g/dL (ref 1.5–4.5)
GLUCOSE: 100 mg/dL — AB (ref 65–99)
Potassium: 5 mmol/L (ref 3.5–5.2)
Sodium: 142 mmol/L (ref 134–144)
Total Protein: 8 g/dL (ref 6.0–8.5)

## 2018-10-12 LAB — CBC WITH DIFFERENTIAL/PLATELET
Basophils Absolute: 0.1 10*3/uL (ref 0.0–0.2)
Basos: 1 %
EOS (ABSOLUTE): 0 10*3/uL (ref 0.0–0.4)
Eos: 1 %
HEMOGLOBIN: 17.6 g/dL (ref 13.0–17.7)
Hematocrit: 53.1 % — ABNORMAL HIGH (ref 37.5–51.0)
Immature Grans (Abs): 0 10*3/uL (ref 0.0–0.1)
Immature Granulocytes: 0 %
LYMPHS ABS: 1.8 10*3/uL (ref 0.7–3.1)
Lymphs: 28 %
MCH: 30.3 pg (ref 26.6–33.0)
MCHC: 33.1 g/dL (ref 31.5–35.7)
MCV: 91 fL (ref 79–97)
MONOCYTES: 11 %
MONOS ABS: 0.7 10*3/uL (ref 0.1–0.9)
NEUTROS ABS: 3.8 10*3/uL (ref 1.4–7.0)
Neutrophils: 59 %
Platelets: 180 10*3/uL (ref 150–450)
RBC: 5.81 x10E6/uL — ABNORMAL HIGH (ref 4.14–5.80)
RDW: 13.3 % (ref 12.3–15.4)
WBC: 6.4 10*3/uL (ref 3.4–10.8)

## 2018-10-18 ENCOUNTER — Ambulatory Visit (HOSPITAL_COMMUNITY)
Admission: RE | Admit: 2018-10-18 | Discharge: 2018-10-18 | Disposition: A | Discharge status: Home / Self Care | Payer: Medicaid Other | Source: Ambulatory Visit | Attending: Family Medicine | Admitting: Family Medicine

## 2018-10-18 DIAGNOSIS — R634 Abnormal weight loss: Secondary | ICD-10-CM | POA: Diagnosis present

## 2018-10-18 MED ORDER — IOHEXOL 300 MG/ML  SOLN
100.0000 mL | Freq: Once | INTRAMUSCULAR | Status: AC | PRN
  Administered 2018-10-18: 100 mL via INTRAVENOUS

## 2018-11-04 ENCOUNTER — Other Ambulatory Visit: Payer: Self-pay | Admitting: Family Medicine

## 2018-11-05 ENCOUNTER — Other Ambulatory Visit: Payer: Self-pay | Admitting: Family Medicine

## 2018-11-05 DIAGNOSIS — I10 Essential (primary) hypertension: Secondary | ICD-10-CM

## 2018-11-12 ENCOUNTER — Other Ambulatory Visit: Payer: Self-pay

## 2018-11-13 MED ORDER — DOCUSATE SODIUM 50 MG PO CAPS: 50.0000 mg | ORAL_CAPSULE | Freq: Two times a day (BID) | ORAL | 0 refills | Status: DC

## 2018-12-02 ENCOUNTER — Other Ambulatory Visit: Payer: Self-pay | Admitting: Family Medicine

## 2018-12-02 DIAGNOSIS — I10 Essential (primary) hypertension: Secondary | ICD-10-CM

## 2018-12-20 ENCOUNTER — Ambulatory Visit: Payer: Medicaid Other | Admitting: Gastroenterology

## 2018-12-31 ENCOUNTER — Other Ambulatory Visit: Payer: Self-pay | Admitting: Gastroenterology

## 2019-02-03 ENCOUNTER — Other Ambulatory Visit: Payer: Self-pay

## 2019-02-03 ENCOUNTER — Other Ambulatory Visit: Payer: Self-pay | Admitting: Gastroenterology

## 2019-02-03 MED ORDER — CYCLOBENZAPRINE HCL 5 MG PO TABS: 5.0000 mg | ORAL_TABLET | Freq: Three times a day (TID) | ORAL | 1 refills | Status: DC | PRN

## 2019-03-03 ENCOUNTER — Other Ambulatory Visit: Payer: Self-pay | Admitting: Family Medicine

## 2019-03-03 ENCOUNTER — Other Ambulatory Visit: Payer: Self-pay | Admitting: Gastroenterology

## 2019-03-03 DIAGNOSIS — I10 Essential (primary) hypertension: Secondary | ICD-10-CM

## 2019-03-31 ENCOUNTER — Other Ambulatory Visit: Payer: Self-pay | Admitting: Family Medicine

## 2019-03-31 DIAGNOSIS — I10 Essential (primary) hypertension: Secondary | ICD-10-CM

## 2019-04-01 MED ORDER — AMLODIPINE BESYLATE 10 MG PO TABS: 10.0000 mg | ORAL_TABLET | Freq: Every day | ORAL | 0 refills | Status: DC

## 2019-04-15 ENCOUNTER — Other Ambulatory Visit: Payer: Self-pay | Admitting: Family Medicine

## 2019-04-15 ENCOUNTER — Other Ambulatory Visit: Payer: Self-pay

## 2019-04-15 MED ORDER — DONEPEZIL HCL 10 MG PO TABS: 10.0000 mg | ORAL_TABLET | Freq: Every day | ORAL | 1 refills | Status: DC

## 2019-04-30 ENCOUNTER — Other Ambulatory Visit: Payer: Self-pay | Admitting: Gastroenterology

## 2019-05-14 ENCOUNTER — Other Ambulatory Visit: Payer: Self-pay | Admitting: Family Medicine

## 2019-05-26 ENCOUNTER — Other Ambulatory Visit: Payer: Self-pay | Admitting: Family Medicine

## 2019-05-26 DIAGNOSIS — K2100 Gastro-esophageal reflux disease with esophagitis, without bleeding: Secondary | ICD-10-CM

## 2019-05-26 DIAGNOSIS — F411 Generalized anxiety disorder: Secondary | ICD-10-CM

## 2019-05-26 DIAGNOSIS — I10 Essential (primary) hypertension: Secondary | ICD-10-CM

## 2019-05-26 MED ORDER — VENLAFAXINE HCL ER 37.5 MG PO CP24: 37.5000 mg | ORAL_CAPSULE | Freq: Every day | ORAL | 1 refills | Status: DC

## 2019-05-26 MED ORDER — OMEPRAZOLE 20 MG PO CPDR: 20.0000 mg | DELAYED_RELEASE_CAPSULE | Freq: Every day | ORAL | 1 refills | Status: DC

## 2019-05-27 ENCOUNTER — Other Ambulatory Visit: Payer: Self-pay

## 2019-05-27 DIAGNOSIS — E785 Hyperlipidemia, unspecified: Secondary | ICD-10-CM

## 2019-05-27 MED ORDER — ROSUVASTATIN CALCIUM 20 MG PO TABS: 20.0000 mg | ORAL_TABLET | Freq: Every day | ORAL | 3 refills | Status: DC

## 2019-05-27 MED ORDER — CYCLOBENZAPRINE HCL 5 MG PO TABS: 5.0000 mg | ORAL_TABLET | Freq: Three times a day (TID) | ORAL | 1 refills | Status: DC | PRN

## 2019-06-26 ENCOUNTER — Other Ambulatory Visit: Payer: Self-pay | Admitting: Family Medicine

## 2019-07-15 ENCOUNTER — Ambulatory Visit (INDEPENDENT_AMBULATORY_CARE_PROVIDER_SITE_OTHER): Payer: Medicaid Other | Admitting: Family Medicine

## 2019-07-15 ENCOUNTER — Other Ambulatory Visit: Payer: Self-pay

## 2019-07-15 VITALS — BP 148/84 | HR 72 | Wt 154.0 lb

## 2019-07-15 DIAGNOSIS — I10 Essential (primary) hypertension: Secondary | ICD-10-CM | POA: Diagnosis not present

## 2019-07-15 DIAGNOSIS — M5442 Lumbago with sciatica, left side: Secondary | ICD-10-CM | POA: Diagnosis not present

## 2019-07-15 DIAGNOSIS — G8929 Other chronic pain: Secondary | ICD-10-CM | POA: Diagnosis not present

## 2019-07-15 DIAGNOSIS — Z23 Encounter for immunization: Secondary | ICD-10-CM

## 2019-07-15 DIAGNOSIS — K5900 Constipation, unspecified: Secondary | ICD-10-CM

## 2019-07-15 MED ORDER — POLYETHYLENE GLYCOL 3350 17 GM/SCOOP PO POWD: 17.0000 g | Freq: Every day | ORAL | 0 refills | Status: DC

## 2019-07-15 MED ORDER — SENNA 8.6 MG PO TABS: 1.0000 | ORAL_TABLET | Freq: Every day | ORAL | 1 refills | Status: DC

## 2019-07-15 NOTE — Assessment & Plan Note (Addendum)
Chronic and stable, previously suspected to be due to arthritis. No current sciatica symptomatology, noted tense paraspinal musculature within lumbar spine.   -Recommended continued lumbar exercises and stretches. - Continue Flexeril as needed - Tylenol 650 mg every 4-6 hours as needed - Follow-up if pain uncontrolled, worsening, development of extremity weakness/numbness, bowel/bladder incontinence

## 2019-07-15 NOTE — Progress Notes (Signed)
Subjective:    Patient ID: Manuel Reyes, male    DOB: [birth year], 68 y.o.   MRN: [id number]   CC: Back pain, constipation  HPI: Manuel Reyes is a 68 year old male with a history of hypertension, diastolic heart failure, dementia, chronic left-sided low back pain, presenting discuss the following:  Nepali interpreter used for the duration of visit, history challenged by language barrier.  Constipation: Chronic.  States he usually has a bowel movement every 2 to 3 days, had a bowel movement this morning that was small and hard.  Often feels bloated and "gassy "when he has not had a bowel movement in a while.  He currently takes MiraLAX once daily, not taking Colace or Senokot.  He says he used to take it twice daily, however seem like too much for him.  Previously also tried Linzess, however said he stopped taking that 1 to 2 years ago (wasn't able to say why).  Endorses well-balanced diet, mostly eating rice/lentils, yogurt,, and plenty of vegetables.  Endorses drinking water throughout the day.  Denies any current abdominal pain, nausea, vomiting, melena, hematochezia, unexpected weight loss.  Do note he had a CT abdomen completed in 09/2018 without abnormality.  Back pain: Chronic, nonprogressive however feels like his back is stiff right now.  Lumbar region.  Previously seen by his primary care provider for this and suspected arthritic changes, given Flexeril and Voltaren, however says he he does not use his medications.  In the past 1-2 weeks he started doing back exercises and stretches with his family that has been helpful.  He denies any spinous process tenderness, saddle anesthesia, bowel/bladder incontinence, paresthesias, extremity weakness/numbness, fever, rash.  Hypertension: Endorses taking his Norvasc and lisinopril-HCTZ.  Smoking status reviewed  Review of Systems Per HPI  Objective:  BP (!) 148/84   Pulse 72   Wt 154 lb (69.9 kg)   SpO2 96%   BMI 24.12 kg/m  Vitals and  nursing note reviewed  General: NAD, pleasant Cardiac: RRR, normal heart sounds, no murmurs Respiratory: CTAB, normal effort Abdomen: soft, nontender, nondistended, normoactive bowel sounds, no hepatosplenomegaly palpated MSK: No deformity, rash overlying lumbar/thoracic spine.  No spinous process tenderness to palpation.  Paraspinal muscles around lumbar region tense.  5/5 lower extremity strength.  2/4 patellar reflexes bilaterally.  Straight leg raise negative bilaterally. Extremities: no edema or cyanosis. WWP. Skin: warm and dry, no rashes noted Neuro: alert and oriented, no focal deficits Psych: normal affect  Assessment & Plan:   Constipation Longstanding. Suspect language barrier vs known mild dementia challenging medical care as he has been prescribed several motility agents in the past but was unaware of them.  No red flag signs through history or physical.  Discussed continued use of MiraLAX daily and titrating up by 1/2 capful until achieving soft stools.  Additionally recommended adding Senokot daily at that time to help with motility. - Encouraged continue well-balanced diet including staying well-hydrated (48-64 oz daily), and incorporating vegetables/high-fiber foods at each meal - MiraLAX as discussed above - Start Senokot once achieving softer stools - Follow-up if not improving or sooner if worsening, severe abdominal pains, melena/hematochezia  Chronic left-sided low back pain with left-sided sciatica Chronic and stable, previously suspected to be due to arthritis. No current sciatica symptomatology, noted tense paraspinal musculature within lumbar spine.   -Recommended continued lumbar exercises and stretches. - Continue Flexeril as needed - Tylenol 650 mg every 4-6 hours as needed - Follow-up if pain uncontrolled, worsening, development of extremity  weakness/numbness, bowel/bladder incontinence  Essential hypertension BP 148/84 in the office today.  Asymptomatic.   Endorsed compliance of her current hypertension regimen.  Recommended continued follow-up with primary care provider for optimal control.    Follow-up if not improving or worsening, also follow-up with PCP for chronic conditions/hypertension  [place] Resident PGY-2

## 2019-07-15 NOTE — Patient Instructions (Signed)
It was wonderful to meet you today.   For your constipation: Make sure that you are drinking plenty of water (48-64 oz daily) and getting a a lot of whole grains and vegetables within your diet.  These will help things move more consistently.  Please also continue to use MiraLAX on a DAILY basis, if 2 times daily was too much for you, try 1.5 capfuls to try to achieve a softer stool.  Once your stool is softer, you can add senna tablets to help things move more regularly.   For your back pain: Please continue to do your exercises and stretches, this will be very helpful!  You can also use a heating pad on your back for 20 minutes at a time a few times a day.  I will also refill your Flexeril.  You can use Tylenol 650 mg every 4-6 hours up to 4 g total in a day.  Topical gels can also be helpful on your back.  Please follow-up if your back pain worsens, you have any weakness/numbness.   Please make sure you are following up with your primary care provider on a regular basis for your chronic conditions, including your high blood pressure.

## 2019-07-15 NOTE — Assessment & Plan Note (Signed)
BP 148/84 in the office today.  Asymptomatic.  Endorsed compliance of her current hypertension regimen.  Recommended continued follow-up with primary care provider for optimal control.

## 2019-07-15 NOTE — Assessment & Plan Note (Signed)
Longstanding. Suspect language barrier vs known mild dementia challenging medical care as he has been prescribed several motility agents in the past but was unaware of them.  No red flag signs through history or physical.  Discussed continued use of MiraLAX daily and titrating up by 1/2 capful until achieving soft stools.  Additionally recommended adding Senokot daily at that time to help with motility. - Encouraged continue well-balanced diet including staying well-hydrated (48-64 oz daily), and incorporating vegetables/high-fiber foods at each meal - MiraLAX as discussed above - Start Senokot once achieving softer stools - Follow-up if not improving or sooner if worsening, severe abdominal pains, melena/hematochezia

## 2019-07-21 ENCOUNTER — Other Ambulatory Visit: Payer: Self-pay | Admitting: Family Medicine

## 2019-08-08 ENCOUNTER — Ambulatory Visit (INDEPENDENT_AMBULATORY_CARE_PROVIDER_SITE_OTHER): Payer: Medicaid Other | Admitting: Family Medicine

## 2019-08-08 ENCOUNTER — Encounter: Payer: Self-pay | Admitting: Family Medicine

## 2019-08-08 ENCOUNTER — Other Ambulatory Visit: Payer: Self-pay

## 2019-08-08 VITALS — BP 145/80 | HR 64 | Wt 155.2 lb

## 2019-08-08 DIAGNOSIS — Z0000 Encounter for general adult medical examination without abnormal findings: Secondary | ICD-10-CM | POA: Diagnosis not present

## 2019-08-08 DIAGNOSIS — M546 Pain in thoracic spine: Secondary | ICD-10-CM

## 2019-08-08 DIAGNOSIS — E785 Hyperlipidemia, unspecified: Secondary | ICD-10-CM | POA: Diagnosis not present

## 2019-08-08 DIAGNOSIS — R5383 Other fatigue: Secondary | ICD-10-CM

## 2019-08-08 MED ORDER — MELOXICAM 15 MG PO TABS: 15.0000 mg | ORAL_TABLET | Freq: Every day | ORAL | 0 refills | Status: DC

## 2019-08-08 NOTE — Patient Instructions (Addendum)
It was great seeing you again today!  I think your back pain is due to rhomboid muscle inflammation.  This is a common spot since it close the scapula and can become inflamed as the scapula moves over them repetitively.  The patient for this rehab exercises which I gave you today.  He also ice the area down for about 10 to 15 minutes every day, and then apply heat to the area.  Lastly I will give you a strong anti-inflammatory medication called meloxicam.  You will take this 1 time a day for 7 days.  The blood work will be getting urine to get a blood count, kidney and liver function test, and a cholesterol panel.  I will give you a call with these results when they come back.

## 2019-08-09 LAB — CBC WITH DIFFERENTIAL/PLATELET
Basophils Absolute: 0.1 10*3/uL (ref 0.0–0.2)
Basos: 1 %
EOS (ABSOLUTE): 0 10*3/uL (ref 0.0–0.4)
Eos: 1 %
Hematocrit: 57.4 % — ABNORMAL HIGH (ref 37.5–51.0)
Hemoglobin: 19 g/dL — ABNORMAL HIGH (ref 13.0–17.7)
Immature Grans (Abs): 0 10*3/uL (ref 0.0–0.1)
Immature Granulocytes: 0 %
Lymphocytes Absolute: 1.6 10*3/uL (ref 0.7–3.1)
Lymphs: 28 %
MCH: 29.8 pg (ref 26.6–33.0)
MCHC: 33.1 g/dL (ref 31.5–35.7)
MCV: 90 fL (ref 79–97)
Monocytes Absolute: 0.7 10*3/uL (ref 0.1–0.9)
Monocytes: 11 %
Neutrophils Absolute: 3.4 10*3/uL (ref 1.4–7.0)
Neutrophils: 59 %
Platelets: 168 10*3/uL (ref 150–450)
RBC: 6.37 x10E6/uL — ABNORMAL HIGH (ref 4.14–5.80)
RDW: 13.2 % (ref 11.6–15.4)
WBC: 5.7 10*3/uL (ref 3.4–10.8)

## 2019-08-09 LAB — COMPREHENSIVE METABOLIC PANEL
ALT: 16 IU/L (ref 0–44)
AST: 24 IU/L (ref 0–40)
Albumin/Globulin Ratio: 1.3 (ref 1.2–2.2)
Albumin: 4.3 g/dL (ref 3.8–4.8)
Alkaline Phosphatase: 123 IU/L — ABNORMAL HIGH (ref 39–117)
BUN/Creatinine Ratio: 14 (ref 10–24)
BUN: 12 mg/dL (ref 8–27)
Bilirubin Total: 0.6 mg/dL (ref 0.0–1.2)
CO2: 23 mmol/L (ref 20–29)
Calcium: 9.5 mg/dL (ref 8.6–10.2)
Chloride: 101 mmol/L (ref 96–106)
Creatinine, Ser: 0.86 mg/dL (ref 0.76–1.27)
GFR calc Af Amer: 104 mL/min/{1.73_m2} (ref >59)
GFR calc non Af Amer: 90 mL/min/{1.73_m2} (ref >59)
Globulin, Total: 3.3 g/dL (ref 1.5–4.5)
Glucose: 93 mg/dL (ref 65–99)
Potassium: 4.8 mmol/L (ref 3.5–5.2)
Sodium: 139 mmol/L (ref 134–144)
Total Protein: 7.6 g/dL (ref 6.0–8.5)

## 2019-08-09 LAB — LIPID PANEL
Chol/HDL Ratio: 5.4 ratio — ABNORMAL HIGH (ref 0.0–5.0)
Cholesterol, Total: 248 mg/dL — ABNORMAL HIGH (ref 100–199)
HDL: 46 mg/dL (ref >39)
LDL Chol Calc (NIH): 167 mg/dL — ABNORMAL HIGH (ref 0–99)
Triglycerides: 191 mg/dL — ABNORMAL HIGH (ref 0–149)
VLDL Cholesterol Cal: 35 mg/dL (ref 5–40)

## 2019-08-12 NOTE — Progress Notes (Signed)
  Nepali interpreter utilized via [employer] iPad  HPI 68 year old male who presents for annual wellness visit.  Patient states that he has been doing well with no issues aside from some chronic back pain.  States that it has been there for several weeks.  It is worse when he is standing up and moving, it goes away when he is laying down.  Patient states that he has been exercising a little bit more recently and this has helped his pain some.  Pressed on the exercises he does, he states that he uses an elliptical machine.  Patient up-to-date all health maintenance items aside from ophthalmology exam.  He is going to work on getting that.  CC: Annual physical   ROS:   Review of Systems See HPI for ROS.   CC, SH/smoking status, and VS noted  Objective: BP (!) 145/80   Pulse 64   Wt 155 lb 3.2 oz (70.4 kg)   SpO2 98%   BMI 24.31 kg/m  Gen: 68 year old [place] male, no acute distress, resting comfortably HEENT: EOMI, PERRLA, no cervical lymphadenopathy CV: Regular rate and rhythm, no M/R/G Resp: Lungs clear to auscultation bilaterally, no accessory muscle use Abd: SNTND, BS present, no guarding or organomegaly Neuro: Alert and oriented, Speech clear, No gross deficits Back: Tenderness to palpation right mid back.  Palpable tenderness appreciably worse when patient engages scapula.  He does have some mild tenderness to rotation of his back.   Assessment and plan:  Back pain Patient's pain consistent with rhomboid muscle tenderness.  Can try meloxicam 15 mg for 7 days.  Also recommended rhomboid rehabilitation stretches/exercises.  Also apply ice and heat to this area.  Follow-up as needed.  Health care maintenance Up to date on all health maintenance items-ophthalmology exam.  Patient will work on getting this done.  Drew CBC, CMP, lipid panel for screening.   Orders Placed This Encounter  Procedures  . CBC with Differential  . Comprehensive metabolic panel    Order Specific  Question:   Has the patient fasted?    Answer:   No  . Lipid panel    Order Specific Question:   Has the patient fasted?    Answer:   No    Meds ordered this encounter  Medications  . meloxicam (MOBIC) 15 MG tablet    Sig: Take 1 tablet (15 mg total) by mouth daily.    Dispense:  7 tablet    Refill:  0   Guadalupe Dawn MD PGY-3 Family Medicine Resident  08/14/2019 11:48 AM

## 2019-08-14 ENCOUNTER — Encounter: Payer: Self-pay | Admitting: Family Medicine

## 2019-08-14 DIAGNOSIS — Z0000 Encounter for general adult medical examination without abnormal findings: Secondary | ICD-10-CM | POA: Insufficient documentation

## 2019-08-14 NOTE — Assessment & Plan Note (Signed)
Patient's pain consistent with rhomboid muscle tenderness.  Can try meloxicam 15 mg for 7 days.  Also recommended rhomboid rehabilitation stretches/exercises.  Also apply ice and heat to this area.  Follow-up as needed.

## 2019-08-14 NOTE — Assessment & Plan Note (Addendum)
Up to date on all health maintenance items-ophthalmology exam.  Patient will work on getting this done.  Drew CBC, CMP, lipid panel for screening.

## 2019-08-20 ENCOUNTER — Other Ambulatory Visit: Payer: Self-pay | Admitting: Family Medicine

## 2019-08-20 DIAGNOSIS — I10 Essential (primary) hypertension: Secondary | ICD-10-CM

## 2019-09-16 ENCOUNTER — Other Ambulatory Visit: Payer: Self-pay | Admitting: Family Medicine

## 2019-10-13 ENCOUNTER — Other Ambulatory Visit: Payer: Self-pay | Admitting: Family Medicine

## 2019-10-14 ENCOUNTER — Other Ambulatory Visit: Payer: Self-pay | Admitting: Family Medicine

## 2019-10-14 DIAGNOSIS — K2100 Gastro-esophageal reflux disease with esophagitis, without bleeding: Secondary | ICD-10-CM

## 2019-10-14 DIAGNOSIS — F411 Generalized anxiety disorder: Secondary | ICD-10-CM

## 2019-11-20 ENCOUNTER — Other Ambulatory Visit: Payer: Self-pay | Admitting: Family Medicine

## 2019-12-18 ENCOUNTER — Other Ambulatory Visit: Payer: Self-pay | Admitting: Family Medicine

## 2019-12-18 DIAGNOSIS — I10 Essential (primary) hypertension: Secondary | ICD-10-CM

## 2020-01-14 ENCOUNTER — Other Ambulatory Visit: Payer: Self-pay | Admitting: Family Medicine

## 2020-01-20 ENCOUNTER — Other Ambulatory Visit: Payer: Self-pay | Admitting: Family Medicine

## 2020-02-13 ENCOUNTER — Ambulatory Visit: Payer: Medicaid Other | Admitting: Family Medicine

## 2020-02-23 ENCOUNTER — Other Ambulatory Visit: Payer: Self-pay | Admitting: Family Medicine

## 2020-02-27 ENCOUNTER — Ambulatory Visit (INDEPENDENT_AMBULATORY_CARE_PROVIDER_SITE_OTHER): Payer: Medicaid Other | Admitting: Family Medicine

## 2020-02-27 ENCOUNTER — Other Ambulatory Visit: Payer: Self-pay

## 2020-02-27 DIAGNOSIS — D751 Secondary polycythemia: Secondary | ICD-10-CM | POA: Diagnosis not present

## 2020-02-27 DIAGNOSIS — M79602 Pain in left arm: Secondary | ICD-10-CM

## 2020-02-27 DIAGNOSIS — F0390 Unspecified dementia without behavioral disturbance: Secondary | ICD-10-CM

## 2020-02-27 NOTE — Patient Instructions (Signed)
Great seeing you today!  I believe your muscle soreness is likely due to your COVID-19 vaccine.  This type of symptoms can sometimes last for a few weeks after the vaccine.  The best treatment for this is anti-inflammatories.  You are already taking Voltaren 75 mg twice daily to continue taking this.  You can take Tylenol 325 mg every 4 hours.  I recommend icing the area for 10 to 15 minutes 2 or 3 times per day, and then applying a heating pad.  Lastly I gave you some stretches to do which should help with this.

## 2020-02-27 NOTE — Progress Notes (Signed)
Patient given both PHQ2 and 9.  Provider aware.  Ozella Almond, [place]

## 2020-02-29 NOTE — Progress Notes (Signed)
   Patient's son acted as Scientist, research (physical sciences), interpreter offered through  [employer] and was refused by patient.  CHIEF COMPLAINT / HPI: 69 year old male who presents for arm soreness after Covid vaccine.  States his arm has been sore for around a week after getting the second dose of his vaccine.  The soreness is in the area of the deltoid where his injection was performed. He has not tried anything for the pain.  Persistent polycythemia noted on last few CBCs after patient had left from office. Attempted to reach patient to further discuss and get repeat lab work but received no answer after multiple attempts on day of visit.  We will continue to try and reach patient, will need to bring in for repeat CBC.  Initial PHQ 2 elevation up to 6, when discussed with patient and son they misread the PHQ 2 and score was post to be 0 for both.  PHQ-9 deferred based on his information.  PERTINENT  PMH / PSH: Polycythemia  OBJECTIVE: BP 125/60   Pulse (!) 59   Ht 5\' 5"  (1.651 m)   Wt 153 lb 3.2 oz (69.5 kg)   SpO2 97%   BMI 25.49 kg/m   Gen: 69 year old [place] male, no acute distress CV: Regular rate rhythm, no M/R/G Resp: Lungs clear to auscultation bilaterally, no accessory muscle use, no respiratory distress Neuro: Alert and oriented, Speech clear, No gross deficits  Left arm: No swelling or bruising noted left deltoid.  Soreness consistent with injection site and's some of the surrounding deltoid area.  Mild tenderness to palpation at this area.  ASSESSMENT / PLAN:  Dementia Stable on donepezil and memantine.  Continue current regimen. -Donepezil 10 mg daily at bedtime -Namenda 5 mg tablet twice daily  Polycythemia Patient with polycythemia at last few checks of CBC.  Most recently hemoglobin 19.0 back in October 2020.  Will recheck at next visit.  If persistently elevated undergo further work-up including peripheral smear  Arm pain Left arm soreness consistent with Covid vaccine  injection site.  Can take Tylenol, apply topical liniments as needed, and I gave stretches to perform.  Can follow-up as needed if symptoms do not resolve.  Patient also has some Voltaren 75 mg left, can take this twice daily.   Guadalupe Dawn MD PGY-3 Family Medicine Resident [place]

## 2020-03-01 DIAGNOSIS — M79603 Pain in arm, unspecified: Secondary | ICD-10-CM

## 2020-03-01 DIAGNOSIS — D751 Secondary polycythemia: Secondary | ICD-10-CM | POA: Insufficient documentation

## 2020-03-01 HISTORY — DX: Pain in arm, unspecified: M79.603

## 2020-03-01 HISTORY — DX: Secondary polycythemia: D75.1

## 2020-03-01 NOTE — Assessment & Plan Note (Signed)
Patient with polycythemia at last few checks of CBC.  Most recently hemoglobin 19.0 back in October 2020.  Will recheck at next visit.  If persistently elevated undergo further work-up including peripheral smear

## 2020-03-01 NOTE — Assessment & Plan Note (Signed)
Stable on donepezil and memantine.  Continue current regimen. -Donepezil 10 mg daily at bedtime -Namenda 5 mg tablet twice daily

## 2020-03-01 NOTE — Assessment & Plan Note (Addendum)
Left arm soreness consistent with Covid vaccine injection site.  Can take Tylenol, apply topical liniments as needed, and I gave stretches to perform.  Can follow-up as needed if symptoms do not resolve.  Patient also has some Voltaren 75 mg left, can take this twice daily.

## 2020-03-15 ENCOUNTER — Telehealth: Payer: Self-pay | Admitting: Family Medicine

## 2020-03-15 NOTE — Telephone Encounter (Signed)
Daughter is calling because she dropped off papers to be filled out by the doctor 2 weeks ago. She wanted to know how much longer this is going to take. Please call her at [phone]. Blima Rich

## 2020-03-16 NOTE — Telephone Encounter (Signed)
I placed the form under the "s" tab at the front on 02/29/2020. I did not make a note or inform the patient it was ready as I had discussed with the patients son during the visit that the form would be ready for pickup on 02/29/2020 and he had voiced agreement that he would come by and pick it up on that date. Please let them know it is still available for pickup.  Guadalupe Dawn MD PGY-3 Family Medicine Resident

## 2020-03-17 NOTE — Telephone Encounter (Signed)
LMOVM informing pt son. Mattia Osterman Kennon Holter, CMA

## 2020-03-22 ENCOUNTER — Other Ambulatory Visit: Payer: Self-pay | Admitting: Family Medicine

## 2020-04-19 ENCOUNTER — Other Ambulatory Visit: Payer: Self-pay | Admitting: Family Medicine

## 2020-04-19 DIAGNOSIS — K2100 Gastro-esophageal reflux disease with esophagitis, without bleeding: Secondary | ICD-10-CM

## 2020-04-19 DIAGNOSIS — I10 Essential (primary) hypertension: Secondary | ICD-10-CM

## 2020-04-19 DIAGNOSIS — E785 Hyperlipidemia, unspecified: Secondary | ICD-10-CM

## 2020-04-20 ENCOUNTER — Other Ambulatory Visit: Payer: Self-pay | Admitting: Family Medicine

## 2020-04-20 DIAGNOSIS — F411 Generalized anxiety disorder: Secondary | ICD-10-CM

## 2020-04-20 IMAGING — CT CT ABD-PELV W/ CM
2 of 5 series · 16 of 46 positions shown, 18 images · IV contrast (Omni 300)
Comparison: None.

CLINICAL DATA: Weight loss. Nonlocalized abdominal pain. History
gastroesophageal reflux disease. Hyperlipidemia.

EXAM:
CT ABDOMEN AND PELVIS WITH CONTRAST
TECHNIQUE: Multidetector CT imaging of the abdomen and pelvis was performed
using the standard protocol following bolus administration of
intravenous contrast.
CONTRAST:  100mL OMNIPAQUE IOHEXOL 300 MG/ML  SOLN

[Series 3: a/p w/ 5mm · axial · 0.69mm/px · z∈[+398,+823]mm · 13 of 95 slices shown, 15 images]
[im 5/95  soft-tissue]
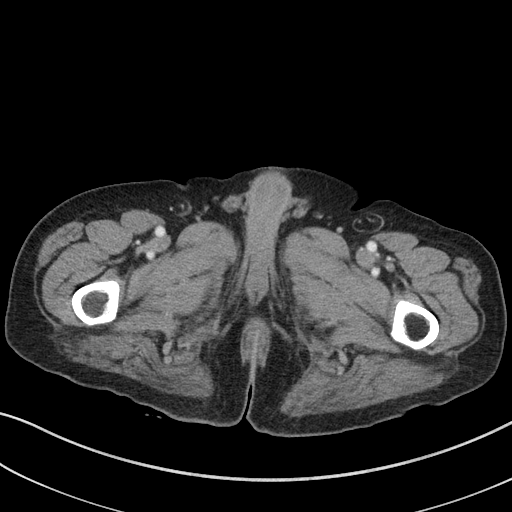
[im 5/95  bone]
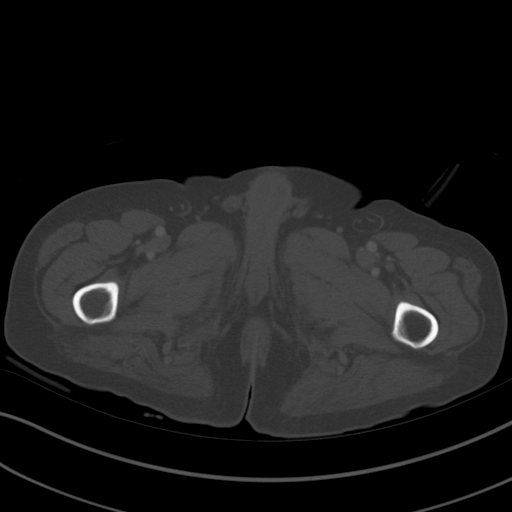
[im 14/95  soft-tissue]
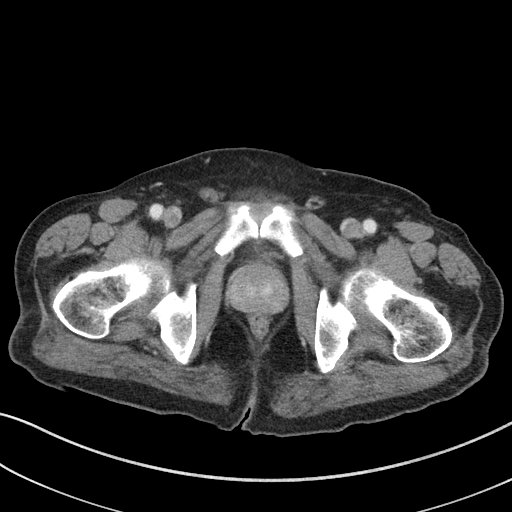
[im 18/95  soft-tissue]
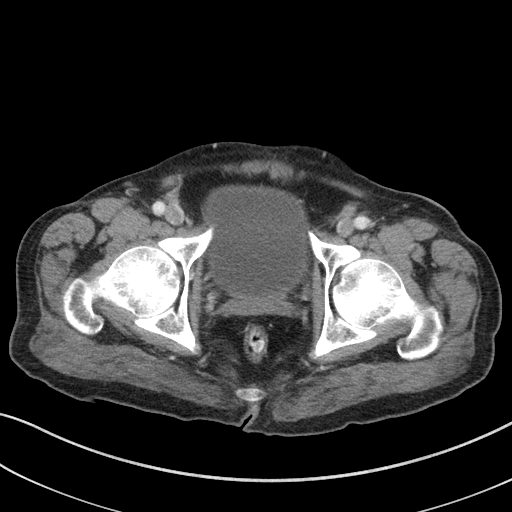
[im 27/95  soft-tissue]
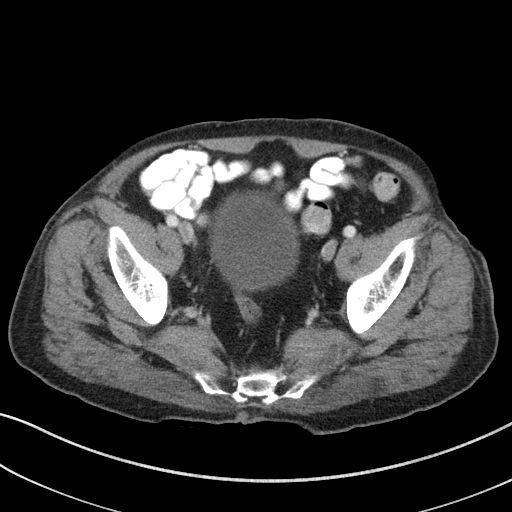
[im 32/95  soft-tissue]
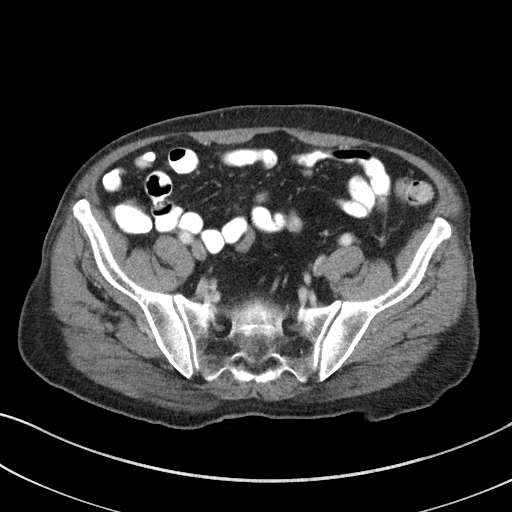
[im 41/95  soft-tissue]
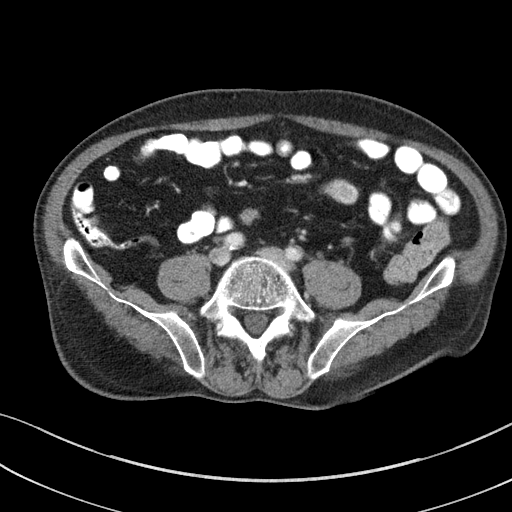
[im 50/95  soft-tissue]
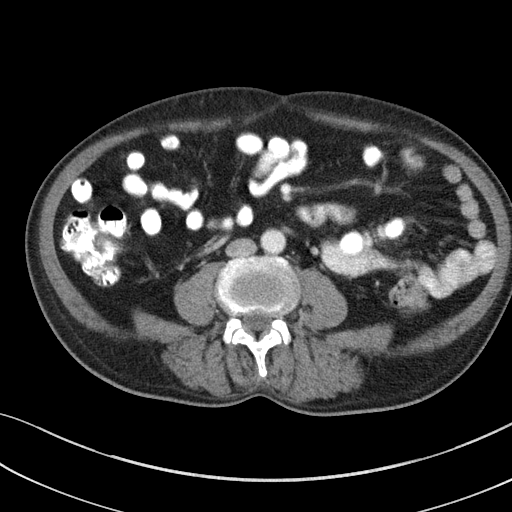
[im 54/95  soft-tissue]
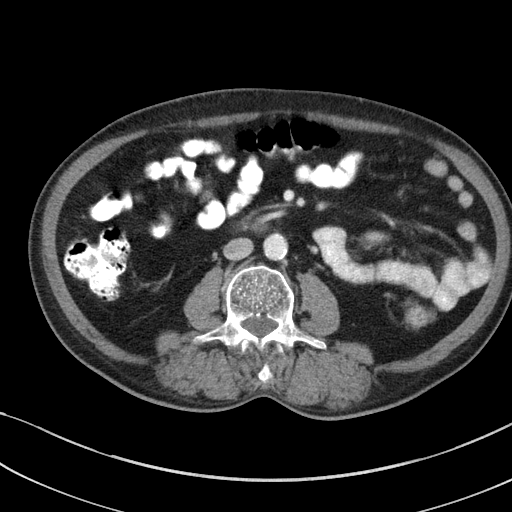
[im 63/95  soft-tissue]
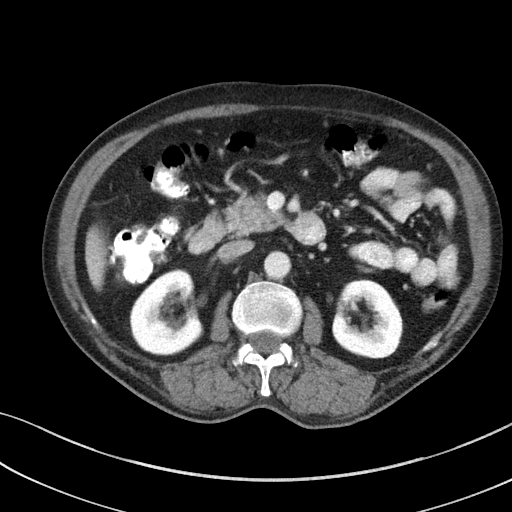
[im 63/95  bone]
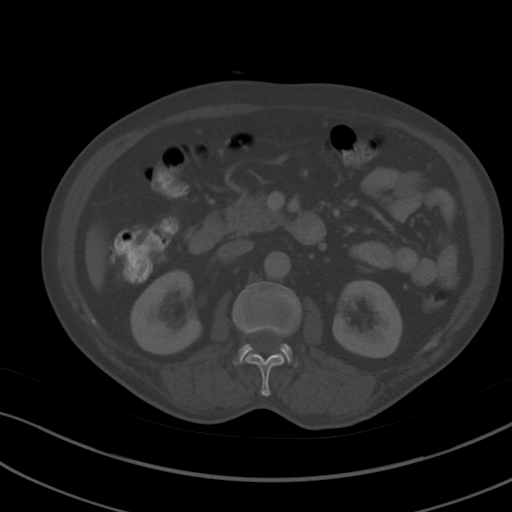
[im 68/95  soft-tissue]
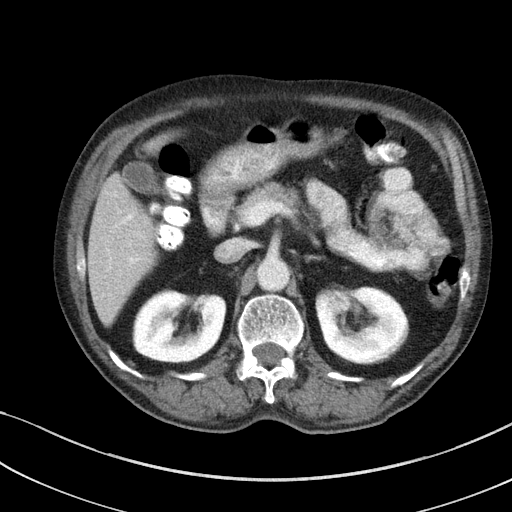
[im 77/95  soft-tissue]
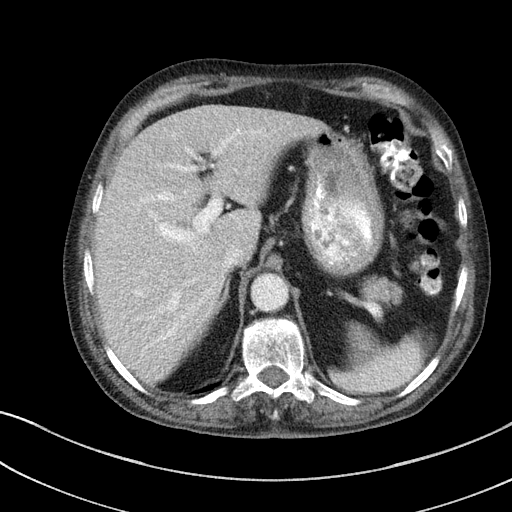
[im 81/95  soft-tissue]
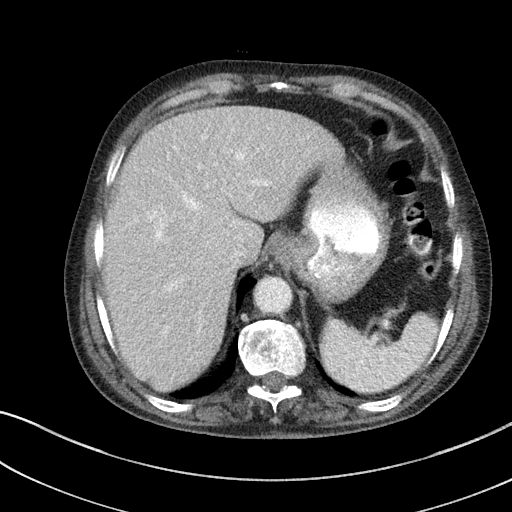
[im 90/95  soft-tissue]
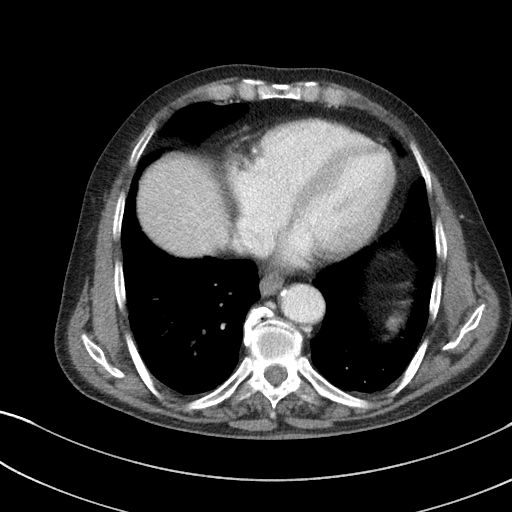

[Series 6: a/p w/ cor · coronal · 0.72mm/px · 3 of 151 slices shown]
[im 67/151  soft-tissue]
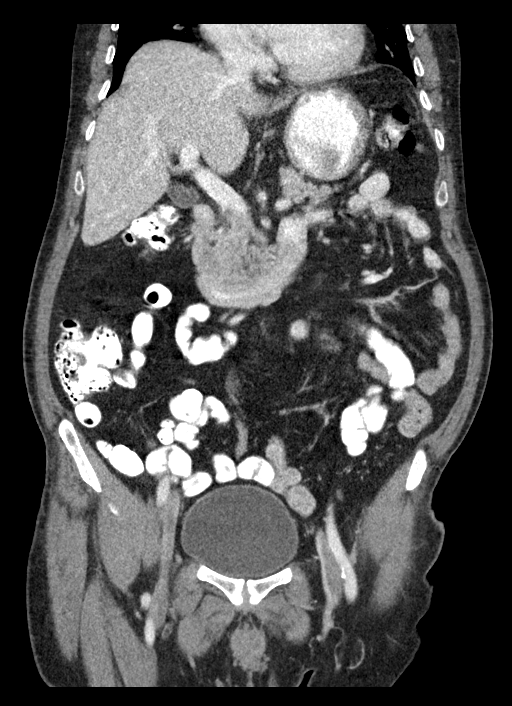
[im 84/151  soft-tissue]
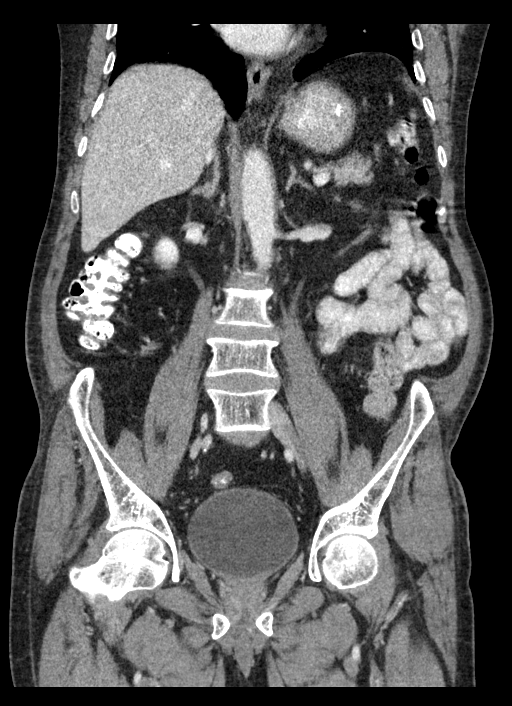
[im 101/151  soft-tissue]
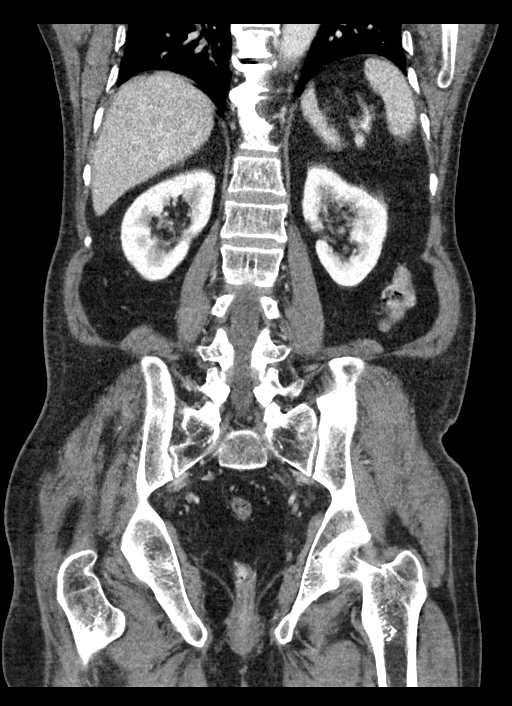

[16 of 46 positions shown; findings below may reference images not displayed]

FINDINGS: Lower chest: Clear lung bases. Borderline cardiomegaly, without
pericardial or pleural effusion.

Hepatobiliary: Minimal motion degradation throughout the abdomen and
upper pelvis. Normal liver. Normal gallbladder, without biliary
ductal dilatation.

Pancreas: Normal, without mass or ductal dilatation.

Spleen: Normal in size, without focal abnormality.

Adrenals/Urinary Tract: Normal adrenal glands. Normal kidneys,
without hydronephrosis. Normal urinary bladder.

Stomach/Bowel: Normal stomach, without wall thickening. Normal
colon, appendix, and terminal ileum. Normal small bowel.

Vascular/Lymphatic: Aortic atherosclerosis. No abdominopelvic
adenopathy.

Reproductive: Normal prostate.

Other: No significant free fluid.

Musculoskeletal: Tiny sclerotic lesion in the right iliac is likely
a bone island. Lower thoracic spondylosis. Mild disc bulge at the
lumbosacral junction.
IMPRESSION: 1. No acute process in the abdomen or pelvis. No explanation for
weight loss.
2. Mild motion degradation.

Aortic Atherosclerosis (HEONX-6VR.R).

## 2020-05-20 ENCOUNTER — Other Ambulatory Visit: Payer: Self-pay | Admitting: Family Medicine

## 2020-06-23 ENCOUNTER — Other Ambulatory Visit: Payer: Self-pay | Admitting: Family Medicine

## 2020-07-14 ENCOUNTER — Other Ambulatory Visit: Payer: Self-pay | Admitting: Family Medicine

## 2020-08-18 ENCOUNTER — Other Ambulatory Visit: Payer: Self-pay | Admitting: Family Medicine

## 2020-09-03 ENCOUNTER — Other Ambulatory Visit: Payer: Self-pay

## 2020-09-03 ENCOUNTER — Encounter: Payer: Self-pay | Admitting: Family Medicine

## 2020-09-03 ENCOUNTER — Ambulatory Visit (INDEPENDENT_AMBULATORY_CARE_PROVIDER_SITE_OTHER): Payer: Medicaid Other | Admitting: Family Medicine

## 2020-09-03 VITALS — BP 132/70 | HR 54 | Ht 65.0 in | Wt 156.6 lb

## 2020-09-03 DIAGNOSIS — E119 Type 2 diabetes mellitus without complications: Secondary | ICD-10-CM

## 2020-09-03 DIAGNOSIS — R3912 Poor urinary stream: Secondary | ICD-10-CM | POA: Insufficient documentation

## 2020-09-03 DIAGNOSIS — E782 Mixed hyperlipidemia: Secondary | ICD-10-CM | POA: Diagnosis not present

## 2020-09-03 DIAGNOSIS — Z23 Encounter for immunization: Secondary | ICD-10-CM | POA: Diagnosis not present

## 2020-09-03 DIAGNOSIS — H538 Other visual disturbances: Secondary | ICD-10-CM

## 2020-09-03 DIAGNOSIS — Z0000 Encounter for general adult medical examination without abnormal findings: Secondary | ICD-10-CM

## 2020-09-03 DIAGNOSIS — E785 Hyperlipidemia, unspecified: Secondary | ICD-10-CM

## 2020-09-03 MED ORDER — ROSUVASTATIN CALCIUM 40 MG PO TABS: 20.0000 mg | ORAL_TABLET | Freq: Every day | ORAL | 3 refills | Status: DC

## 2020-09-03 MED ORDER — TAMSULOSIN HCL 0.4 MG PO CAPS
0.4000 mg | ORAL_CAPSULE | Freq: Every day | ORAL | 0 refills | Status: DC
Start: 2020-09-03 — End: 2021-05-13

## 2020-09-03 NOTE — Progress Notes (Addendum)
    SUBJECTIVE:   CHIEF COMPLAINT / HPI: physical   [employer] Interpreter ID [id number] used throughout encounter   Urinary symptoms  Patient reports that he is having urinary problems with weak urinary stream and is unable to empty his bladder. This has been happening for 1 month. He denies dysuria or hematuria. He has had this problem before and reports that it was treated with medicine, he unsure of the name of the medication. He denies personal any personal history of prostate cancer or enlarged prostate. Denies any family history of prostate cancer and has no problems with bowel movements. Declines rectal exam today. Patient reports urinating multiple times per day with very small volume each time. Denies abdominal pain.   HLD  Patient reports taking medication without any problems. He reports doing some physical activity including walking regularly.    HM  Needs opthal exam for diabetes. Patient reports having poor/blurry vision. Patient is open to going to ophthalmology. Patient offered covid and had second dose 1 month ago (08/08/20) and influenza vaccine   PERTINENT  PMH / PSH:  Hypertension  Depression Hyperlipidemia  OBJECTIVE:   BP 132/70   Pulse (!) 54   Ht 5\' 5"  (1.651 m)   Wt 156 lb 9.6 oz (71 kg)   SpO2 98%   BMI 26.06 kg/m   General: male appearing stated age in no acute distress HEENT: MMM, no oral lesions noted,Neck non-tender without lymphadenopathy, masses or thyromegaly Cardio: Normal S1 and S2, no S3 or S4. Rhythm is regular. No murmurs or rubs.  Bilateral radial pulses palpable Pulm: Clear to auscultation bilaterally, no crackles, wheezing, or diminished breath sounds. Normal respiratory effort, stable on RA Abdomen: Bowel sounds normal. Abdomen soft and non-tender.  Extremities: No peripheral edema. Warm/ well perfused. 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 Will do trial of Flomax and see if symptoms improve.  -Flomax 0.4mg    Health care maintenance Influenza vaccine given today Patient states that he received his second dose of his Covid vaccination last month Ophthalmology referral sent   Hyperlipidemia  Currently on Crestor 20 mg.  Last lipid panel with elevated cholesterol 248, triglycerides 191 and HDL 5.4 with LDL of 167. -direct LDL, will increase Crestor to 40 mg     Eulis Foster, MD [place]

## 2020-09-03 NOTE — Assessment & Plan Note (Signed)
Influenza vaccine given today Patient states that he received his second dose of his Covid vaccination last month Ophthalmology referral sent

## 2020-09-03 NOTE — Assessment & Plan Note (Addendum)
Currently on Crestor 20 mg.  Last lipid panel with elevated cholesterol 248, triglycerides 191 and HDL 5.4 with LDL of 167. -direct LDL, will increase Crestor to 40 mg

## 2020-09-03 NOTE — Assessment & Plan Note (Addendum)
Given age and symptoms, likely patient experiencing BPH and urinary symptoms as a result. Will do trial of Flomax and see if symptoms improve.  -Flomax 0.4mg   -will need PSA at follow up appt

## 2020-09-03 NOTE — Patient Instructions (Addendum)
I have prescribed a medication called Flomax to help with your urinary symptoms.  Please return in 2 weeks for an appointment with me to follow up on your symptoms.   I have sent a referral for ophthalmology. They will call to make an appointment with you.

## 2020-09-04 LAB — LDL CHOLESTEROL, DIRECT: LDL Direct: 156 mg/dL — ABNORMAL HIGH (ref 0–99)

## 2020-09-06 ENCOUNTER — Other Ambulatory Visit: Payer: Self-pay | Admitting: Family Medicine

## 2020-09-07 ENCOUNTER — Other Ambulatory Visit: Payer: Self-pay | Admitting: Family Medicine

## 2020-09-07 DIAGNOSIS — E785 Hyperlipidemia, unspecified: Secondary | ICD-10-CM

## 2020-09-07 MED ORDER — ROSUVASTATIN CALCIUM 40 MG PO TABS: 40.0000 mg | ORAL_TABLET | Freq: Every day | ORAL | 0 refills | Status: DC

## 2020-09-07 NOTE — Progress Notes (Signed)
Increased crestor to 40mg  daily from 20 mg daily for LDL >100

## 2020-09-09 ENCOUNTER — Telehealth: Payer: Self-pay | Admitting: Family Medicine

## 2020-09-09 NOTE — Telephone Encounter (Signed)
Received request for assessment for personal care services attestation of medical needs form on 09/09/2020.  Contacted patient via telephone in order to discuss medical diagnoses for personal care services and to confirm whether or not patient continues to receive these personal care services.  Both patient and daughter-in-law were available for call using [employer] interpreter.  Patient and his daughter in law state that he continues to have personal care services in the home for 2-3 hours/day on a daily basis.  Personal care services include help with toileting, laundry and eating.  Patient receives the services as a result of his amputation of left hand fingers which has caused him to have difficulty with using his hands and performing ADLs.  Patient also has history of seizures and daughter reports last episode of seizure-like activity last week.  Personal care services form for [employer] completed and faxed to [phone].

## 2020-09-13 ENCOUNTER — Other Ambulatory Visit: Payer: Self-pay | Admitting: *Deleted

## 2020-09-13 ENCOUNTER — Other Ambulatory Visit: Payer: Self-pay | Admitting: Family Medicine

## 2020-09-13 DIAGNOSIS — I10 Essential (primary) hypertension: Secondary | ICD-10-CM

## 2020-09-13 DIAGNOSIS — F411 Generalized anxiety disorder: Secondary | ICD-10-CM

## 2020-09-13 DIAGNOSIS — K2100 Gastro-esophageal reflux disease with esophagitis, without bleeding: Secondary | ICD-10-CM

## 2020-09-13 MED ORDER — DONEPEZIL HCL 10 MG PO TABS
10.0000 mg | ORAL_TABLET | Freq: Every day | ORAL | 1 refills | Status: DC
Start: 2020-09-13 — End: 2021-03-14

## 2020-09-13 MED ORDER — GABAPENTIN 100 MG PO CAPS
100.0000 mg | ORAL_CAPSULE | Freq: Three times a day (TID) | ORAL | 3 refills | Status: DC
Start: 2020-09-13 — End: 2021-10-07

## 2020-10-19 ENCOUNTER — Telehealth: Payer: Self-pay | Admitting: Family Medicine

## 2020-10-19 NOTE — Telephone Encounter (Signed)
Reviewed form and placed in PCP's box for completion.  .Analleli Gierke R Thurmond Hildebran, CMA  

## 2020-10-19 NOTE — Telephone Encounter (Signed)
Independent Assessment for Personal Care form dropped off for at front desk for completion.  Verified that patient section of form has been completed.  Last DOS/WCC with PCP was 10/19/20.  Placed form in team folder to be completed by clinical staff.  Creig Hines

## 2020-10-24 NOTE — Telephone Encounter (Signed)
Called phone number listed for [place]  in regards to request for assessment for personal care services attestation for medical need.  Upon chart review, this form have been completed and faxed into patient's chart as of 09/09/2020.  This form had not been received at that time and representatives were requesting that it be faxed again.  [facility] office staff was able to fax form again and received confirmation of receipt by requesting agency.  12/26: Contacted patient's family in order to update them that the form that had been dropped off in the office was completed in November and recent to the requesting agency with confirmation that it was received.  Patient signed was appreciative of updated. I confirmed with patient son that it would not be necessary to pick up the form from the office because it was already returned to the requesting agency via fax.  Eulis Foster, MD [place], PGY-2

## 2020-12-14 ENCOUNTER — Other Ambulatory Visit: Payer: Self-pay | Admitting: Family Medicine

## 2020-12-15 ENCOUNTER — Other Ambulatory Visit: Payer: Self-pay | Admitting: *Deleted

## 2020-12-15 DIAGNOSIS — F411 Generalized anxiety disorder: Secondary | ICD-10-CM

## 2020-12-15 MED ORDER — VENLAFAXINE HCL ER 37.5 MG PO CP24: ORAL_CAPSULE | ORAL | 1 refills | Status: DC

## 2020-12-15 MED ORDER — CYCLOBENZAPRINE HCL 5 MG PO TABS: 5.0000 mg | ORAL_TABLET | Freq: Three times a day (TID) | ORAL | 1 refills | Status: DC | PRN

## 2021-01-04 ENCOUNTER — Telehealth: Payer: Self-pay | Admitting: Family Medicine

## 2021-01-04 NOTE — Telephone Encounter (Signed)
Reviewed form and placed in PCP's box for completion.  .Joley Utecht R Masen Salvas, CMA  

## 2021-01-04 NOTE — Telephone Encounter (Signed)
[facility] Referral Request form dropped off for at front desk for completion.  Verified that patient section of form has been completed.  Last DOS/WCC with PCP was 09/03/20 .  Placed form in team folder to be completed by clinical staff.  Creig Hines

## 2021-01-07 NOTE — Telephone Encounter (Signed)
Primary care provider details completed informed.  Form placed front for patient pickup.  Please call to inform patient sign.  Form is ready.  Eulis Foster, MD [place], PGY-2

## 2021-01-10 NOTE — Telephone Encounter (Signed)
Son contacted and informed of form ready for pick up. A copy has been made for batch scanning.

## 2021-02-07 NOTE — Progress Notes (Signed)
    SUBJECTIVE:   CHIEF COMPLAINT / HPI: general follow up and constipation    Nepali interpreter was present via iPad.   Patient presents for follow up for his medical issues. He reports that he has been able to take his medications as prescribed and has no new complaints today. Patient reports feeling well. He does add that he intermittently has constipation and requests medication to help with bowel movements. He reports that his last BM was one day ago. He denies hematochezia/melena. In review of patient's medications, he reports that he has stopped taking his statin as he was not sure he still needed it.    HM  Patient recommended for PNA vaccine. Patient is agreeable to vaccination.   PERTINENT  PMH / PSH:  HTN  Weak Urinary Stream   OBJECTIVE:   BP 138/60   Pulse (!) 104   Ht 5\' 5"  (1.651 m)   Wt 166 lb 3.2 oz (75.4 kg)   SpO2 92%   BMI 27.66 kg/m   General: male appearing stated age in no acute distress Cardio: Normal S1 and S2, no S3 or S4. Rhythm is regular. No murmurs or rubs.  Bilateral radial pulses palpable Pulm: Clear to auscultation bilaterally, no crackles, wheezing, or diminished breath sounds. Normal respiratory effort, stable on RA Abdomen: Bowel sounds normal. Abdomen soft and non-tender.  Extremities: No peripheral edema. Warm/ well perfused.  Neuro: pt alert and oriented x4   ASSESSMENT/PLAN:   Health care maintenance Offered PNA vaccine. Patient agreeable.   Essential hypertension Patient will need metabolic panel at follow up appt.  - continue amlodipine 10mg   - contiinue lisinopril-HCTZ 20-25  Hyperlipidemia Refills for rosuvastatin sent  Recommend lipid panel at follow up visit      Eulis Foster, MD [place]

## 2021-02-08 ENCOUNTER — Ambulatory Visit: Payer: Medicaid Other | Admitting: Family Medicine

## 2021-02-08 ENCOUNTER — Other Ambulatory Visit: Payer: Self-pay

## 2021-02-08 ENCOUNTER — Encounter: Payer: Self-pay | Admitting: Family Medicine

## 2021-02-08 DIAGNOSIS — E785 Hyperlipidemia, unspecified: Secondary | ICD-10-CM | POA: Diagnosis present

## 2021-02-08 DIAGNOSIS — Z0000 Encounter for general adult medical examination without abnormal findings: Secondary | ICD-10-CM

## 2021-02-08 DIAGNOSIS — I10 Essential (primary) hypertension: Secondary | ICD-10-CM | POA: Diagnosis not present

## 2021-02-08 MED ORDER — POLYETHYLENE GLYCOL 3350 17 GM/SCOOP PO POWD: 17.0000 g | Freq: Every day | ORAL | 5 refills | Status: DC

## 2021-02-08 MED ORDER — SENNA 8.6 MG PO TABS
1.0000 | ORAL_TABLET | Freq: Every day | ORAL | 1 refills | Status: DC | PRN
Start: 2021-02-08 — End: 2021-05-13

## 2021-02-08 MED ORDER — ROSUVASTATIN CALCIUM 40 MG PO TABS: 40.0000 mg | ORAL_TABLET | Freq: Every day | ORAL | 2 refills | Status: DC

## 2021-02-08 NOTE — Patient Instructions (Addendum)
It was a pleasure to see you today!  Thank you for choosing [facility] for your primary care.   Manuel Reyes was seen for physical.   Please follow up with me in 3 months

## 2021-02-08 NOTE — Assessment & Plan Note (Signed)
Offered PNA vaccine. Patient agreeable.

## 2021-02-10 NOTE — Assessment & Plan Note (Signed)
Refills for rosuvastatin sent  Recommend lipid panel at follow up visit

## 2021-02-10 NOTE — Assessment & Plan Note (Signed)
Patient will need metabolic panel at follow up appt.  - continue amlodipine 10mg   - contiinue lisinopril-HCTZ 20-25

## 2021-02-11 ENCOUNTER — Telehealth: Payer: Self-pay | Admitting: Family Medicine

## 2021-02-11 DIAGNOSIS — R748 Abnormal levels of other serum enzymes: Secondary | ICD-10-CM

## 2021-02-11 DIAGNOSIS — I10 Essential (primary) hypertension: Secondary | ICD-10-CM

## 2021-02-11 NOTE — Telephone Encounter (Signed)
Contacted patient via home phone number.  Patient was unavailable however was able to speak with his daughter-in-law.  Patient noted to have elevated alk phos in has need for repeat metabolic panel given his hypertension and currently on antihypertensive medication. Appointment scheduled for 02/18/2021 to collect CMP.

## 2021-02-18 ENCOUNTER — Other Ambulatory Visit: Payer: Medicaid Other

## 2021-02-23 ENCOUNTER — Other Ambulatory Visit: Payer: Medicaid Other

## 2021-02-23 ENCOUNTER — Other Ambulatory Visit: Payer: Self-pay

## 2021-02-23 DIAGNOSIS — R748 Abnormal levels of other serum enzymes: Secondary | ICD-10-CM

## 2021-02-23 DIAGNOSIS — I10 Essential (primary) hypertension: Secondary | ICD-10-CM

## 2021-02-24 LAB — COMPREHENSIVE METABOLIC PANEL
ALT: 21 IU/L (ref 0–44)
AST: 22 IU/L (ref 0–40)
Albumin/Globulin Ratio: 1.5 (ref 1.2–2.2)
Albumin: 4.6 g/dL (ref 3.8–4.8)
Alkaline Phosphatase: 114 IU/L (ref 44–121)
BUN/Creatinine Ratio: 11 (ref 10–24)
BUN: 9 mg/dL (ref 8–27)
Bilirubin Total: 0.5 mg/dL (ref 0.0–1.2)
CO2: 24 mmol/L (ref 20–29)
Calcium: 9.7 mg/dL (ref 8.6–10.2)
Chloride: 100 mmol/L (ref 96–106)
Creatinine, Ser: 0.84 mg/dL (ref 0.76–1.27)
Globulin, Total: 3 g/dL (ref 1.5–4.5)
Glucose: 126 mg/dL — ABNORMAL HIGH (ref 65–99)
Potassium: 4.5 mmol/L (ref 3.5–5.2)
Sodium: 143 mmol/L (ref 134–144)
Total Protein: 7.6 g/dL (ref 6.0–8.5)
eGFR: 95 mL/min/{1.73_m2} (ref >59)

## 2021-03-14 ENCOUNTER — Other Ambulatory Visit: Payer: Self-pay | Admitting: Family Medicine

## 2021-03-14 ENCOUNTER — Other Ambulatory Visit: Payer: Self-pay | Admitting: *Deleted

## 2021-03-14 DIAGNOSIS — I10 Essential (primary) hypertension: Secondary | ICD-10-CM

## 2021-03-14 DIAGNOSIS — E785 Hyperlipidemia, unspecified: Secondary | ICD-10-CM

## 2021-03-14 DIAGNOSIS — K2100 Gastro-esophageal reflux disease with esophagitis, without bleeding: Secondary | ICD-10-CM

## 2021-03-14 MED ORDER — DONEPEZIL HCL 10 MG PO TABS: 10.0000 mg | ORAL_TABLET | Freq: Every day | ORAL | 1 refills | Status: DC

## 2021-03-14 MED ORDER — LEVETIRACETAM 500 MG PO TABS
500.0000 mg | ORAL_TABLET | Freq: Two times a day (BID) | ORAL | 11 refills | Status: DC
Start: 2021-03-14 — End: 2022-05-31

## 2021-03-14 MED ORDER — OMEPRAZOLE 20 MG PO CPDR: 1.0000 | DELAYED_RELEASE_CAPSULE | Freq: Every day | ORAL | 1 refills | Status: DC

## 2021-03-14 MED ORDER — AMLODIPINE BESYLATE 10 MG PO TABS: 1.0000 | ORAL_TABLET | Freq: Every day | ORAL | 1 refills | Status: DC

## 2021-05-12 ENCOUNTER — Other Ambulatory Visit: Payer: Self-pay

## 2021-05-12 ENCOUNTER — Ambulatory Visit (INDEPENDENT_AMBULATORY_CARE_PROVIDER_SITE_OTHER): Payer: Medicaid Other | Admitting: Family Medicine

## 2021-05-12 ENCOUNTER — Encounter: Payer: Self-pay | Admitting: Family Medicine

## 2021-05-12 VITALS — BP 138/84 | HR 64 | Wt 159.0 lb

## 2021-05-12 DIAGNOSIS — N3090 Cystitis, unspecified without hematuria: Secondary | ICD-10-CM | POA: Diagnosis present

## 2021-05-12 DIAGNOSIS — R350 Frequency of micturition: Secondary | ICD-10-CM

## 2021-05-12 DIAGNOSIS — D751 Secondary polycythemia: Secondary | ICD-10-CM

## 2021-05-12 DIAGNOSIS — N401 Enlarged prostate with lower urinary tract symptoms: Secondary | ICD-10-CM

## 2021-05-12 DIAGNOSIS — N400 Enlarged prostate without lower urinary tract symptoms: Secondary | ICD-10-CM | POA: Insufficient documentation

## 2021-05-12 LAB — POCT UA - MICROSCOPIC ONLY

## 2021-05-12 LAB — POCT URINALYSIS DIP (MANUAL ENTRY)
Bilirubin, UA: NEGATIVE
Glucose, UA: NEGATIVE mg/dL
Ketones, POC UA: NEGATIVE mg/dL
Nitrite, UA: NEGATIVE
Protein Ur, POC: 100 mg/dL — AB
Spec Grav, UA: 1.025 (ref 1.010–1.025)
Urobilinogen, UA: 0.2 E.U./dL
pH, UA: 6 (ref 5.0–8.0)

## 2021-05-12 MED ORDER — CIPROFLOXACIN HCL 250 MG PO TABS: 250.0000 mg | ORAL_TABLET | Freq: Two times a day (BID) | ORAL | 0 refills | Status: AC

## 2021-05-12 NOTE — Progress Notes (Signed)
Manuel Reyes is alone Sources of clinical information for visit is/are patient and past medical records. Nursing assessment for this office visit was reviewed with the patient for accuracy and revision.  Interview conducted with [employer] [place] interpreter (Sanju [phone])   Previous Report(s) Reviewed: lab reports and office notes  Depression screen [facility] 2/9 05/12/2021  Decreased Interest 2  Down, Depressed, Hopeless 2  PHQ - 2 Score 4  Altered sleeping 2  Tired, decreased energy 2  Change in appetite 2  Feeling bad or failure about yourself  1  Trouble concentrating 1  Moving slowly or fidgety/restless 0  Suicidal thoughts 0  PHQ-9 Score 12  Difficult doing work/chores -  Some recent data might be hidden    Fall Risk  05/12/2021 10/11/2018 12/21/2017 07/27/2017 12/18/2016  Falls in the past year? 0 0 No No Yes  Number falls in past yr: 0 - - - 1  Injury with Fall? - - - - No    PHQ9 SCORE ONLY 05/12/2021 02/08/2021 09/03/2020  PHQ-9 Total Score 12 6 1     Adult vaccines due  Topic Date Due   TETANUS/TDAP  04/16/2026    Health Maintenance Due  Topic Date Due   OPHTHALMOLOGY EXAM  Never done   Zoster Vaccines- Shingrix (1 of 2) Never done   COVID-19 Vaccine (4 - Booster) 03/08/2021      History/P.E. limitations: dementia  Adult vaccines due  Topic Date Due   TETANUS/TDAP  04/16/2026    Diabetes Health Maintenance Due  Topic Date Due   OPHTHALMOLOGY EXAM  Never done    Health Maintenance Due  Topic Date Due   OPHTHALMOLOGY EXAM  Never done   Zoster Vaccines- Shingrix (1 of 2) Never done   COVID-19 Vaccine (4 - Booster) 03/08/2021     Chief Complaint  Patient presents with   painful urination    Dysuria - Onset: a week ago - Management: Drinking lots of fluid without helping - Urinary frequency every 1-2 hours, even during sleep - (+) hesitancy, (+) sense of incomplete voiding, (+) dribbling staining underwear, (+) weak stream - No fever, (+) good  appetite - Severity (how bothered): severe - Incontinence: No

## 2021-05-12 NOTE — Patient Instructions (Addendum)
Start taking the Antibiotic Cipro.  Take one tablet twice a day on empty stomach for 7 days.    If not improving in 3 days, left our office know.

## 2021-05-13 ENCOUNTER — Encounter: Payer: Self-pay | Admitting: Family Medicine

## 2021-05-13 DIAGNOSIS — N3090 Cystitis, unspecified without hematuria: Secondary | ICD-10-CM | POA: Insufficient documentation

## 2021-05-13 NOTE — Assessment & Plan Note (Addendum)
First-time UTI (+) Pyuria Urine Cx sent Rx Cipro 250 twice a day x 7 days.  Prostate penetration antibiotic.   Infomred via interpreter to contact office if not improving by 7/18 or if worsens.

## 2021-05-14 LAB — URINE CULTURE

## 2021-05-16 ENCOUNTER — Telehealth: Payer: Self-pay

## 2021-05-16 DIAGNOSIS — N401 Enlarged prostate with lower urinary tract symptoms: Secondary | ICD-10-CM

## 2021-05-16 MED ORDER — TAMSULOSIN HCL 0.4 MG PO CAPS: 0.4000 mg | ORAL_CAPSULE | Freq: Every day | ORAL | 3 refills | Status: DC

## 2021-05-16 NOTE — Telephone Encounter (Signed)
I left message on patient's son's vm. Told him that I would add another medication (Flomax) to his Cipro (though urine culture grew < 10^4) given history BPH prescribed Flomax 0.4 mg daily, #30 w/o RF in November 2021.    A/ Possible UTI BPH with possible LUTS  P/ Flomax 0.4 mg daily Complete Cipro course Notify if not improved by 05/19/21.

## 2021-05-16 NOTE — Telephone Encounter (Signed)
Patient's son calls nurse line regarding continued symptoms of UTI. Reports that patient has been taking Cipro since appointment on 7/14, however, symptoms have not improved. Patient continues to have increased urinary frequency, burning with urination and back pain. Denies fever or abdominal pain.   Patient was informed that if symptoms had not improved by 7/18, he was to contact office for further instructions.   Will forward to Dr. McDiarmid as he saw patient on 7/14.   Patient can be reached at [phone]  Please advise.   Talbot Grumbling, RN

## 2021-05-17 NOTE — Telephone Encounter (Signed)
I left a message with his son, Dmetrius Ambs, inquiring whether he had picked up his father's medication and whether he has statred it.  I asked that should his father's symptoms not improve after three days of Flomax, that our office be contacted.

## 2021-08-11 ENCOUNTER — Other Ambulatory Visit: Payer: Self-pay | Admitting: Family Medicine

## 2021-08-11 DIAGNOSIS — I10 Essential (primary) hypertension: Secondary | ICD-10-CM

## 2021-09-30 ENCOUNTER — Telehealth: Payer: Self-pay

## 2021-09-30 DIAGNOSIS — G629 Polyneuropathy, unspecified: Secondary | ICD-10-CM

## 2021-09-30 DIAGNOSIS — G8929 Other chronic pain: Secondary | ICD-10-CM

## 2021-09-30 DIAGNOSIS — M5442 Lumbago with sciatica, left side: Secondary | ICD-10-CM

## 2021-09-30 NOTE — Telephone Encounter (Signed)
Community care guide calls nurse line regarding patient request for rolling walker.   Please route back to RN team once order has been placed.   Talbot Grumbling, RN

## 2021-10-03 NOTE — Telephone Encounter (Signed)
Community message sent to [employer]. Will await response.   Gwenn Teodoro C Shaniah Baltes, RN  

## 2021-10-06 ENCOUNTER — Other Ambulatory Visit: Payer: Self-pay | Admitting: Family Medicine

## 2021-10-06 DIAGNOSIS — I10 Essential (primary) hypertension: Secondary | ICD-10-CM

## 2021-11-04 ENCOUNTER — Other Ambulatory Visit: Payer: Self-pay | Admitting: Family Medicine

## 2021-11-05 ENCOUNTER — Other Ambulatory Visit: Payer: Self-pay | Admitting: Family Medicine

## 2021-11-18 ENCOUNTER — Ambulatory Visit (INDEPENDENT_AMBULATORY_CARE_PROVIDER_SITE_OTHER): Payer: Medicaid Other | Admitting: Family Medicine

## 2021-11-18 ENCOUNTER — Encounter: Payer: Self-pay | Admitting: Family Medicine

## 2021-11-18 ENCOUNTER — Other Ambulatory Visit: Payer: Self-pay

## 2021-11-18 VITALS — BP 130/100 | HR 68 | Ht 65.0 in | Wt 156.6 lb

## 2021-11-18 DIAGNOSIS — I10 Essential (primary) hypertension: Secondary | ICD-10-CM | POA: Diagnosis not present

## 2021-11-18 DIAGNOSIS — M549 Dorsalgia, unspecified: Secondary | ICD-10-CM

## 2021-11-18 DIAGNOSIS — N401 Enlarged prostate with lower urinary tract symptoms: Secondary | ICD-10-CM

## 2021-11-18 DIAGNOSIS — R413 Other amnesia: Secondary | ICD-10-CM

## 2021-11-18 DIAGNOSIS — R350 Frequency of micturition: Secondary | ICD-10-CM | POA: Diagnosis not present

## 2021-11-18 DIAGNOSIS — D751 Secondary polycythemia: Secondary | ICD-10-CM

## 2021-11-18 DIAGNOSIS — E785 Hyperlipidemia, unspecified: Secondary | ICD-10-CM | POA: Diagnosis not present

## 2021-11-18 DIAGNOSIS — R7989 Other specified abnormal findings of blood chemistry: Secondary | ICD-10-CM | POA: Diagnosis not present

## 2021-11-18 LAB — POCT URINALYSIS DIP (MANUAL ENTRY)
Bilirubin, UA: NEGATIVE
Blood, UA: NEGATIVE
Glucose, UA: NEGATIVE mg/dL
Ketones, POC UA: NEGATIVE mg/dL
Nitrite, UA: NEGATIVE
Protein Ur, POC: 30 mg/dL — AB
Spec Grav, UA: 1.025 (ref 1.010–1.025)
Urobilinogen, UA: 0.2 E.U./dL
pH, UA: 6.5 (ref 5.0–8.0)

## 2021-11-18 MED ORDER — MELOXICAM 7.5 MG PO TABS: 7.5000 mg | ORAL_TABLET | Freq: Every day | ORAL | 0 refills | Status: DC

## 2021-11-18 MED ORDER — TAMSULOSIN HCL 0.4 MG PO CAPS: 0.4000 mg | ORAL_CAPSULE | Freq: Every day | ORAL | 0 refills | Status: DC

## 2021-11-18 NOTE — Progress Notes (Addendum)
° ° °  SUBJECTIVE:   CHIEF COMPLAINT / HPI:   Back Pain -Patient complaining to family about back pain over past 1 month -Constant, but worse after sitting or standing for prolonged period of time -Located in mid to lower back, bilateral but R>L -Does not radiate -No recent injury or trauma -No change in activity, no heavy lifting -No numbness or weakness -No incontinence -No known fever/chills -Has not tried anything for relief -Hx of back pain a few years ago which resolved after he took a medication. Son does not know name of this medication.  Urinary Issues -Takes a long time to initiate stream -Only urinates small amounts at a time -Denies pain with urination -No obvious blood in urine -Previously on Flomax but ran out of this several weeks ago  Patient here with his son today who is not very familiar with his medications, so formal med rec unable to be completed  PERTINENT  PMH / PSH: dementia, HFpEF, HTN, BPH, GERD,   OBJECTIVE:   BP (!) 130/100    Pulse 68    Ht 5\' 5"  (1.651 m)    Wt 156 lb 9.6 oz (71 kg)    SpO2 99%    BMI 26.06 kg/m   General: NAD, pleasant, able to participate in exam Respiratory: No respiratory distress Skin: warm and dry, no rashes noted Psych: Normal affect and mood Neuro: grossly intact, normal gait Neck: no midline tenderness, normal ROM Back: no midline tenderness. Normal curvature of spine. Tenderness to palpation of latissimus dorsi on right (lateral thoracic area). Full ROM with flexion, extension, rotation, and lateral bending although flexion and lateral bending toward the right elicit some pain. Negative straight leg raise.   ASSESSMENT/PLAN:   Back pain Acute over past 1 month. Located in lateral thoracic region, right sided more than left. On exam he has an area of tenderness over his lat muscles on the right, which raise suspicion for muscular strain/tightness as the main etiology. Given his age and noted degenerative changes on  prior imaging studies (CT abdomen), this may be contributing as well. No red flags. Per chart review was given Meloxicam in the past for back pain and did well. -Meloxicam 7.5mg  daily x7 days -Encouraged heating pad, frequent position changes, gentle stretching if able -Unfortunately unable to participate in physical therapy due to dementia -Return if no improvement, could consider imaging at that time  BPH (benign prostatic hyperplasia) Known hx of BPH, with recent worsening of symptoms in the setting of not taking flomax. UA unremarkable today. -Refills sent for Flomax 0.4mg  daily   Health Maintenance Will obtain basic labs today including -CBC (hx of polycythemia, last Hgb 19.0 in 2020) -lipid panel (hx of HLD, on statin, no lipids within past year) -TSH (due to memory problems) -CMP BP elevated, unclear which medications he's taking. Advised to schedule follow up with PCP and to bring all medications to this visit. Flexeril on med list-- patient advised to STOP this medication if he still has it at home.   Alcus Dad, MD [place]

## 2021-11-18 NOTE — Patient Instructions (Addendum)
It was great to meet you!  Your back pain is most likely from muscle tightness. There is also a component of arthritis as well. I have sent an anti-inflammatory medication (Meloxicam or Mobic) to your pharmacy which you can take for the next 7 days.  You should also use a heating pad for 15-20 minutes 2-3 times per day. Try not to stay in the same position for too long.  There is a medication on your list called cyclobenzaprine or Flexeril. If you still have this at home, please STOP taking it.  Lastly, I have sent refills on the medication to help with urinating. It's called Flomax.  Please schedule an appointment with your regular doctor to discuss you blood pressure and to make sure your urinary symptoms are improving. It would be helpful to bring ALL your medications to this visit.  We are checking several labs today. We will call you if any changes are needed based on the results.  Take care, Dr Rock Nephew

## 2021-11-19 LAB — COMPREHENSIVE METABOLIC PANEL
ALT: 23 IU/L (ref 0–44)
AST: 29 IU/L (ref 0–40)
Albumin/Globulin Ratio: 1.6 (ref 1.2–2.2)
Albumin: 4.4 g/dL (ref 3.8–4.8)
Alkaline Phosphatase: 105 IU/L (ref 44–121)
BUN/Creatinine Ratio: 12 (ref 10–24)
BUN: 10 mg/dL (ref 8–27)
Bilirubin Total: 0.6 mg/dL (ref 0.0–1.2)
CO2: 25 mmol/L (ref 20–29)
Calcium: 9.1 mg/dL (ref 8.6–10.2)
Chloride: 101 mmol/L (ref 96–106)
Creatinine, Ser: 0.82 mg/dL (ref 0.76–1.27)
Globulin, Total: 2.8 g/dL (ref 1.5–4.5)
Glucose: 88 mg/dL (ref 70–99)
Potassium: 4.9 mmol/L (ref 3.5–5.2)
Sodium: 140 mmol/L (ref 134–144)
Total Protein: 7.2 g/dL (ref 6.0–8.5)
eGFR: 95 mL/min/{1.73_m2} (ref >59)

## 2021-11-19 LAB — CBC
Hematocrit: 51.4 % — ABNORMAL HIGH (ref 37.5–51.0)
Hemoglobin: 17.6 g/dL (ref 13.0–17.7)
MCH: 30 pg (ref 26.6–33.0)
MCHC: 34.2 g/dL (ref 31.5–35.7)
MCV: 88 fL (ref 79–97)
Platelets: 168 10*3/uL (ref 150–450)
RBC: 5.86 x10E6/uL — ABNORMAL HIGH (ref 4.14–5.80)
RDW: 13.1 % (ref 11.6–15.4)
WBC: 6 10*3/uL (ref 3.4–10.8)

## 2021-11-19 LAB — LIPID PANEL
Chol/HDL Ratio: 4.9 ratio (ref 0.0–5.0)
Cholesterol, Total: 241 mg/dL — ABNORMAL HIGH (ref 100–199)
HDL: 49 mg/dL (ref >39)
LDL Chol Calc (NIH): 165 mg/dL — ABNORMAL HIGH (ref 0–99)
Triglycerides: 150 mg/dL — ABNORMAL HIGH (ref 0–149)
VLDL Cholesterol Cal: 27 mg/dL (ref 5–40)

## 2021-11-19 LAB — TSH: TSH: 0.252 u[IU]/mL — ABNORMAL LOW (ref 0.450–4.500)

## 2021-11-19 NOTE — Assessment & Plan Note (Signed)
Acute over past 1 month. Located in lateral thoracic region, right sided more than left. On exam he has an area of tenderness over his lat muscles on the right, which raise suspicion for muscular strain/tightness as the main etiology. Given his age and noted degenerative changes on prior imaging studies (CT abdomen), this may be contributing as well. No red flags. Per chart review was given Meloxicam in the past for back pain and did well. -Meloxicam 7.5mg  daily x7 days -Encouraged heating pad, frequent position changes, gentle stretching if able -Unfortunately unable to participate in physical therapy due to dementia -Return if no improvement, could consider imaging at that time

## 2021-11-19 NOTE — Assessment & Plan Note (Addendum)
Known hx of BPH, with recent worsening of symptoms in the setting of not taking flomax. UA unremarkable today. -Refills sent for Flomax 0.4mg  daily

## 2021-11-21 ENCOUNTER — Telehealth: Payer: Self-pay | Admitting: Family Medicine

## 2021-11-21 DIAGNOSIS — E785 Hyperlipidemia, unspecified: Secondary | ICD-10-CM

## 2021-11-21 DIAGNOSIS — R7989 Other specified abnormal findings of blood chemistry: Secondary | ICD-10-CM

## 2021-11-21 MED ORDER — ROSUVASTATIN CALCIUM 40 MG PO TABS: 40.0000 mg | ORAL_TABLET | Freq: Every day | ORAL | 3 refills | Status: DC

## 2021-11-21 NOTE — Telephone Encounter (Signed)
Called patient's daughter in law back. Used phone interpreter [employer], [place] # [id number]. Was able to reach her Isidor Holts) and place her name in the chart under contacts.   Cholesterol: DIL reports he is taking the Crestor, unable to recall dose from memory. Attempted to look through medications, but could not find it in his medications currently. She then reports that he is out and she needs to go to the pharmacy to get it filled.  Sent new script for 40 mg daily.   TSH: Ordered add-on T4 and T3.  Referral to endocrine.   Ezequiel Essex, MD

## 2021-11-21 NOTE — Telephone Encounter (Signed)
Called to discuss recent lab results. [place] interpreter [place], [place] utilized for phone call to family. Called the primary number for this patient, [phone], and was able to reach his daughter in law. She was currently driving and requested I call her back in 15-20 minutes.   Cholesterol:    - chart unclear on which dose of Crestor he is on (both 20 mg and 40 mg listed) - last clinic note indicate family member who was with him unable to perform med rec - I need to confirm his dose of Crestor and that he is taking it every day  Thyroid:  - I have ordered an add-on T4, T3  Will attempt to call back in 20 minutes.  Ezequiel Essex, MD

## 2021-11-21 NOTE — Progress Notes (Signed)
Covering for Dr. Rock Nephew. Given low TSH, will place order to add-on T4 and T3 to evaluate hyperthyroidism.  Ezequiel Essex, MD

## 2021-11-21 NOTE — Addendum Note (Signed)
Addended by: Renard Hamper on: 11/21/2021 04:28 PM   Modules accepted: Orders

## 2021-11-21 NOTE — Addendum Note (Signed)
Addended by: Renard Hamper on: 11/21/2021 05:15 PM   Modules accepted: Orders

## 2021-12-02 ENCOUNTER — Other Ambulatory Visit: Payer: Self-pay | Admitting: Family Medicine

## 2021-12-02 DIAGNOSIS — F411 Generalized anxiety disorder: Secondary | ICD-10-CM

## 2021-12-03 ENCOUNTER — Other Ambulatory Visit: Payer: Self-pay | Admitting: Family Medicine

## 2021-12-03 DIAGNOSIS — I10 Essential (primary) hypertension: Secondary | ICD-10-CM

## 2021-12-06 ENCOUNTER — Other Ambulatory Visit: Payer: Self-pay | Admitting: Family Medicine

## 2021-12-21 ENCOUNTER — Other Ambulatory Visit: Payer: Self-pay | Admitting: Family Medicine

## 2022-01-05 ENCOUNTER — Other Ambulatory Visit: Payer: Self-pay | Admitting: Family Medicine

## 2022-01-06 ENCOUNTER — Other Ambulatory Visit: Payer: Self-pay | Admitting: Family Medicine

## 2022-01-09 ENCOUNTER — Other Ambulatory Visit: Payer: Self-pay | Admitting: Family Medicine

## 2022-01-09 DIAGNOSIS — F411 Generalized anxiety disorder: Secondary | ICD-10-CM

## 2022-01-10 ENCOUNTER — Other Ambulatory Visit: Payer: Self-pay | Admitting: Family Medicine

## 2022-01-11 ENCOUNTER — Other Ambulatory Visit: Payer: Self-pay

## 2022-01-11 ENCOUNTER — Ambulatory Visit (INDEPENDENT_AMBULATORY_CARE_PROVIDER_SITE_OTHER): Payer: Medicaid Other | Admitting: Family Medicine

## 2022-01-11 ENCOUNTER — Other Ambulatory Visit: Payer: Self-pay | Admitting: Family Medicine

## 2022-01-11 VITALS — BP 133/92 | HR 68 | Wt 155.4 lb

## 2022-01-11 DIAGNOSIS — M5440 Lumbago with sciatica, unspecified side: Secondary | ICD-10-CM

## 2022-01-11 DIAGNOSIS — R300 Dysuria: Secondary | ICD-10-CM

## 2022-01-11 NOTE — Patient Instructions (Addendum)
It was great seeing you today! ? ?Today we discussed your back pain. I believe this is due to sciatic nerve pain. Please do the stretching exercises attached and you may take ibuprofen for the pain occasionally. I also want to make sure this is not due to a kidney infection or urinary tract infection. We will get a urine sample today and I will let you know of any abnormal results.  ? ?Please follow up at your next scheduled appointment in 3 weeks, if anything arises between now and then, please don't hesitate to contact our office. ? ? ?Thank you for allowing [place] to be a part of your medical care! ? ?Thank you, ?Dr. Larae Grooms  ?

## 2022-01-11 NOTE — Progress Notes (Signed)
? ? ?  SUBJECTIVE:  ? ?CHIEF COMPLAINT / HPI:  ? ?Patient presents accompanied with son with back pain for the past week, makes it difficult to walk and bend. He has had this type of back pain last year and he was given medication which helped. Unsure of the medication. Complains of mid and right lower back pain. He has been taking ibuprofen for the pain which helps. Feels like a stabbing pain, pain has been consistent. Denies radiating pain elsewhere. Denies fever, chills, numbness, tingling or associated symptoms. Endorses dysuria that has been occurring over 6 months. Sometimes has dribbling urine. Denies any personal history of cancer.  ? ?OBJECTIVE:  ? ?BP (!) 133/92   Pulse 68   Wt 155 lb 6.4 oz (70.5 kg)   SpO2 100%   BMI 25.86 kg/m?   ?General: Patient well-appearing, in no acute distress. ?CV: RRR, no murmurs or gallops auscultated ?Resp: CTAB, no wheezing, rales or rhonchi noted ?MSK: positive straight leg on the right, tenderness noted along right lumbar region on deep palpation, right CVA tenderness noted ?Neuro: intermittent mildly antalgic gait, 5/5 UE strength, gross sensation intact  ? ?ASSESSMENT/PLAN:  ? ?Back pain ?-likely secondary to sciatica, stretching exercises provided, follow up in 3 weeks and may consider x-ray and PT referral at that time. Ibuprofen as appropriate for the pain ?-also concern for pyelonephritis given dysuria and CVA tenderness, awaiting UA and culture results ?-may consider PSA and flomax at future visit  ?-instructed to follow up in 3 weeks  ? ? ? ? ?Donney Dice, DO ?[place]  ?

## 2022-01-11 NOTE — Assessment & Plan Note (Signed)
-  likely secondary to sciatica, stretching exercises provided, follow up in 3 weeks and may consider x-ray and PT referral at that time. Ibuprofen as appropriate for the pain ?-also concern for pyelonephritis given dysuria and CVA tenderness, awaiting UA and culture results ?-may consider PSA and flomax at future visit  ?-instructed to follow up in 3 weeks  ?

## 2022-01-12 ENCOUNTER — Encounter: Payer: Self-pay | Admitting: Family Medicine

## 2022-01-12 ENCOUNTER — Other Ambulatory Visit: Payer: Self-pay | Admitting: Family Medicine

## 2022-01-12 LAB — URINALYSIS
Bilirubin, UA: NEGATIVE
Glucose, UA: NEGATIVE
Ketones, UA: NEGATIVE
Leukocytes,UA: NEGATIVE
Nitrite, UA: NEGATIVE
RBC, UA: NEGATIVE
Specific Gravity, UA: 1.016 (ref 1.005–1.030)
Urobilinogen, Ur: 0.2 mg/dL (ref 0.2–1.0)
pH, UA: 6.5 (ref 5.0–7.5)

## 2022-01-13 ENCOUNTER — Other Ambulatory Visit: Payer: Self-pay | Admitting: Family Medicine

## 2022-01-13 LAB — URINE CULTURE: Organism ID, Bacteria: NO GROWTH

## 2022-01-14 ENCOUNTER — Other Ambulatory Visit: Payer: Self-pay | Admitting: Family Medicine

## 2022-01-31 ENCOUNTER — Other Ambulatory Visit: Payer: Self-pay | Admitting: Family Medicine

## 2022-01-31 DIAGNOSIS — I10 Essential (primary) hypertension: Secondary | ICD-10-CM

## 2022-01-31 NOTE — Progress Notes (Deleted)
? ? ?  SUBJECTIVE:  ? ?CHIEF COMPLAINT / HPI: back pain  ? ?Back Pain  ? 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 ? ?OBJECTIVE:  ? ?There were no vitals taken for this visit.  ?Back: ?No gross deformity, scoliosis. ?TTP .  No midline or bony TTP. ?FROM. ?Strength LEs 5/5 all muscle groups.   ?2+ MSRs in patellar and achilles tendons, equal bilaterally. ?Negative SLRs. ?Sensation intact to light touch bilaterally. ?Negative logroll bilateral hips ?Negative fabers and piriformis stretches. ? ? ?ASSESSMENT/PLAN:  ? ?No problem-specific Assessment & Plan notes found for this encounter. ?  ? ? ?Eulis Foster, MD ?[place]  ?

## 2022-02-01 ENCOUNTER — Ambulatory Visit: Payer: Medicaid Other | Admitting: Family Medicine

## 2022-02-03 ENCOUNTER — Other Ambulatory Visit: Payer: Self-pay | Admitting: Family Medicine

## 2022-03-10 ENCOUNTER — Other Ambulatory Visit: Payer: Self-pay | Admitting: Family Medicine

## 2022-03-10 DIAGNOSIS — K2100 Gastro-esophageal reflux disease with esophagitis, without bleeding: Secondary | ICD-10-CM

## 2022-04-05 ENCOUNTER — Other Ambulatory Visit: Payer: Self-pay | Admitting: Family Medicine

## 2022-04-10 ENCOUNTER — Other Ambulatory Visit: Payer: Self-pay | Admitting: Family Medicine

## 2022-04-10 DIAGNOSIS — I10 Essential (primary) hypertension: Secondary | ICD-10-CM

## 2022-05-30 ENCOUNTER — Other Ambulatory Visit: Payer: Self-pay | Admitting: Family Medicine

## 2022-05-30 DIAGNOSIS — K2100 Gastro-esophageal reflux disease with esophagitis, without bleeding: Secondary | ICD-10-CM

## 2022-05-30 DIAGNOSIS — F411 Generalized anxiety disorder: Secondary | ICD-10-CM

## 2022-07-07 ENCOUNTER — Other Ambulatory Visit: Payer: Self-pay | Admitting: *Deleted

## 2022-07-11 ENCOUNTER — Other Ambulatory Visit: Payer: Self-pay | Admitting: Family Medicine

## 2022-07-11 DIAGNOSIS — I10 Essential (primary) hypertension: Secondary | ICD-10-CM

## 2022-08-08 ENCOUNTER — Other Ambulatory Visit: Payer: Self-pay | Admitting: *Deleted

## 2022-08-08 MED ORDER — GABAPENTIN 600 MG PO TABS: 300.0000 mg | ORAL_TABLET | Freq: Every day | ORAL | 1 refills | Status: DC

## 2022-08-09 ENCOUNTER — Other Ambulatory Visit: Payer: Self-pay | Admitting: Student

## 2022-08-17 NOTE — Progress Notes (Deleted)
    SUBJECTIVE:   CHIEF COMPLAINT / HPI:   Patient is a 71 year old male with history of BPH present with complaint of difficulty with voiding. He's currently on flomax 0.4 mg and endorses compliance to his medication  Reports/ Denies pain with voing. No blood with urination  Report/denies decrease in urinary stream  PERTINENT  PMH / PSH: ***  OBJECTIVE:   There were no vitals taken for this visit.  ***  Physical Exam General: Alert, well appearing, NAD, Oriented x4 Cardiovascular: RRR, No Murmurs, Normal S2/S2 Respiratory: CTAB, No wheezing or Rales Abdomen: No distension or tenderness Extremities: No edema on extremities   Skin: Warm and dry  ASSESSMENT/PLAN:   No problem-specific Assessment & Plan notes found for this encounter.     Alen Bleacher, MD [place]   {    This will disappear when note is signed, click to select method of visit    :1}

## 2022-08-18 ENCOUNTER — Telehealth: Payer: Self-pay

## 2022-08-18 ENCOUNTER — Ambulatory Visit: Payer: Medicaid Other | Admitting: Student

## 2022-08-18 NOTE — Telephone Encounter (Signed)
Patient's son calls nurse line regarding rescheduling appointment for today. He was scheduled for 8:30, however, son thought appointment was for 9:30.  Patient has been having issues with urinary incontinence for the last month. He denies painful urination, back pain, abdominal pain, fever or chills.   Rescheduled for Tuesday, 10/24 with Dr. Adah Salvage.   Provided with red flags.   Talbot Grumbling, RN

## 2022-08-22 ENCOUNTER — Ambulatory Visit: Payer: Medicaid Other | Admitting: Student

## 2022-08-22 ENCOUNTER — Encounter: Payer: Self-pay | Admitting: Student

## 2022-08-22 VITALS — BP 134/76 | HR 85 | Ht 65.0 in | Wt 159.2 lb

## 2022-08-22 DIAGNOSIS — F411 Generalized anxiety disorder: Secondary | ICD-10-CM | POA: Diagnosis not present

## 2022-08-22 DIAGNOSIS — Z23 Encounter for immunization: Secondary | ICD-10-CM | POA: Diagnosis not present

## 2022-08-22 DIAGNOSIS — R350 Frequency of micturition: Secondary | ICD-10-CM

## 2022-08-22 DIAGNOSIS — F32A Depression, unspecified: Secondary | ICD-10-CM | POA: Diagnosis not present

## 2022-08-22 DIAGNOSIS — R300 Dysuria: Secondary | ICD-10-CM | POA: Diagnosis present

## 2022-08-22 DIAGNOSIS — N401 Enlarged prostate with lower urinary tract symptoms: Secondary | ICD-10-CM | POA: Diagnosis not present

## 2022-08-22 LAB — POCT URINALYSIS DIP (MANUAL ENTRY)
Bilirubin, UA: NEGATIVE
Blood, UA: NEGATIVE
Glucose, UA: NEGATIVE mg/dL
Ketones, POC UA: NEGATIVE mg/dL
Leukocytes, UA: NEGATIVE
Nitrite, UA: NEGATIVE
Protein Ur, POC: 100 mg/dL — AB
Spec Grav, UA: 1.02 (ref 1.010–1.025)
Urobilinogen, UA: 0.2 E.U./dL
pH, UA: 5.5 (ref 5.0–8.0)

## 2022-08-22 LAB — POCT UA - MICROSCOPIC ONLY: WBC, Ur, HPF, POC: NONE SEEN

## 2022-08-22 MED ORDER — VENLAFAXINE HCL ER 37.5 MG PO CP24: ORAL_CAPSULE | ORAL | 5 refills | Status: DC

## 2022-08-22 MED ORDER — TAMSULOSIN HCL 0.4 MG PO CAPS: 0.4000 mg | ORAL_CAPSULE | Freq: Every day | ORAL | 0 refills | Status: DC

## 2022-08-22 NOTE — Progress Notes (Addendum)
    SUBJECTIVE:   CHIEF COMPLAINT / HPI:   Patient is a 71 year old male with history of BPH presenting with intermittent incontinence. He is accompanied by son who served as the interpreter at patient's request. He said the symptoms have been going on for months.  He has also had increased frequency urgency and dysuria. With increased frequency and urgency patient is only able to have minimal void without attempts of urination. He denies any fevers, chills, back pain or saddle paresthesia. No LE tingling or numbness  Home medication includes Flomax 0.4 mg daily.  However patient said he has been noncompliant with his medication as he has run out of medication for weeks.   PERTINENT  PMH / PSH: BPH  OBJECTIVE:   BP 134/76   Pulse 85   Ht '5\' 5"'$  (1.651 m)   Wt 159 lb 3.2 oz (72.2 kg)   SpO2 97%   BMI 26.49 kg/m    Physical Exam General: Alert, well appearing, NAD Cardiovascular: Regular rate, well-perfused Respiratory: Normal work of breathing on room air Abdomen: No distension or tenderness MSK: No CVA tenderness Extremities:  Normal LE strength with sensation intact. [facility] Visit from 08/22/2022 in [facility]  PHQ-9 Total Score 15        ASSESSMENT/PLAN:   BPH (benign prostatic hyperplasia) Patient's constellation of symptoms and report of noncompliance to his Flomax highly suspicious his symptoms are likely due to his enlarged prostate.  Point-of-care urine analysis was negative for UTI.  Refilled patient's Flomax which he will take once daily.  He will follow-up in 2 weeks to reassess symptoms.  If no improvement of symptoms at that time, we will consider checking PSA level and possible urology referral.  Depression Patient with history of depression presents today with PHQ-9 of 15.  Does endorse feelings of depression but denies SI/HI.  He denies noncompliance to medication and has not been taking his Effexor due to running  out of medication.  Refilled patient's Effexor encourage patient to take this daily as prescribed.  Discussed benefits of a therapist however patient is reluctant due to transportation issues.  Follow-up with PCP if worsening of symptoms or no improvement.   Alen Bleacher, MD [place]

## 2022-08-22 NOTE — Patient Instructions (Addendum)
It was wonderful to see you today. Thank you for allowing me to be a part of your care. Below is a short summary of what we discussed at your visit today:  His incontinence and difficulty with urination is likely due to his enlarged prostate.  I have refilled his Flomax he should take daily as prescribed. Urine test was negative for urinary tract infection.  For his concerns of depression I have also refilled his Effexor which he should take daily with breakfast.  Please follow-up in 2 weeks to see if his urination has improved.  Please bring all of your medications to every appointment!  If you have any questions or concerns, please do not hesitate to contact us via phone or MyChart message.   Manuel Bleacher, MD [facility]

## 2022-08-22 NOTE — Assessment & Plan Note (Addendum)
Patient's constellation of symptoms and report of noncompliance to his Flomax highly suspicious his symptoms are likely due to his enlarged prostate.  Point-of-care urine analysis was negative for UTI.  Refilled patient's Flomax which he will take once daily.  He will follow-up in 2 weeks to reassess symptoms.  If no improvement of symptoms at that time, we will consider checking PSA level and possible urology referral.

## 2022-08-22 NOTE — Assessment & Plan Note (Signed)
Patient with history of depression presents today with PHQ-9 of 15.  Does endorse feelings of depression but denies SI/HI.  He denies noncompliance to medication and has not been taking his Effexor due to running out of medication.  Refilled patient's Effexor encourage patient to take this daily as prescribed.  Discussed benefits of a therapist however patient is reluctant due to transportation issues.  Follow-up with PCP if worsening of symptoms or no improvement.

## 2022-08-28 ENCOUNTER — Telehealth: Payer: Self-pay

## 2022-08-28 DIAGNOSIS — F411 Generalized anxiety disorder: Secondary | ICD-10-CM

## 2022-08-28 DIAGNOSIS — N401 Enlarged prostate with lower urinary tract symptoms: Secondary | ICD-10-CM

## 2022-08-28 MED ORDER — TAMSULOSIN HCL 0.4 MG PO CAPS: 0.4000 mg | ORAL_CAPSULE | Freq: Every day | ORAL | 0 refills | Status: DC

## 2022-08-28 MED ORDER — VENLAFAXINE HCL ER 37.5 MG PO CP24: ORAL_CAPSULE | ORAL | 5 refills | Status: DC

## 2022-08-28 NOTE — Telephone Encounter (Signed)
Patients son calls nurse line in regards to Flomax and Effexor. He reports he has been by the pharmacy several times and they report not receiving prescriptions.  I called the pharmacy and they stated they have not received either prescription.   Prescriptions resent to pharmacy.

## 2022-09-02 ENCOUNTER — Other Ambulatory Visit: Payer: Self-pay | Admitting: Student

## 2022-09-02 DIAGNOSIS — K2100 Gastro-esophageal reflux disease with esophagitis, without bleeding: Secondary | ICD-10-CM

## 2022-09-05 ENCOUNTER — Other Ambulatory Visit: Payer: Self-pay

## 2022-09-06 NOTE — Telephone Encounter (Signed)
Prescription refilled 07/2022 with refill

## 2022-10-04 ENCOUNTER — Other Ambulatory Visit: Payer: Self-pay | Admitting: Student

## 2022-10-04 ENCOUNTER — Other Ambulatory Visit: Payer: Self-pay

## 2022-10-04 DIAGNOSIS — I10 Essential (primary) hypertension: Secondary | ICD-10-CM

## 2022-10-04 MED ORDER — LISINOPRIL-HYDROCHLOROTHIAZIDE 20-25 MG PO TABS: 1.0000 | ORAL_TABLET | Freq: Every day | ORAL | 0 refills | Status: DC

## 2022-10-06 ENCOUNTER — Telehealth: Payer: Self-pay | Admitting: Student

## 2022-10-06 NOTE — Telephone Encounter (Signed)
Patient's son dropped off referral request form to be completed. Last DOS was 08/22/22. Placed in [employer].

## 2022-10-06 NOTE — Telephone Encounter (Signed)
Placed in MDs box to be filled out. Manuel Reyes, CMA  

## 2022-10-13 NOTE — Telephone Encounter (Signed)
Form placed up front for pick up.   Copy made for batch scanning.   Son has been notified.

## 2022-11-02 ENCOUNTER — Other Ambulatory Visit: Payer: Self-pay | Admitting: Student

## 2022-11-02 ENCOUNTER — Ambulatory Visit: Payer: Medicaid Other | Admitting: Family Medicine

## 2022-11-02 VITALS — BP 158/112 | HR 100 | Wt 166.0 lb

## 2022-11-02 DIAGNOSIS — I10 Essential (primary) hypertension: Secondary | ICD-10-CM

## 2022-11-02 DIAGNOSIS — R350 Frequency of micturition: Secondary | ICD-10-CM

## 2022-11-02 DIAGNOSIS — R195 Other fecal abnormalities: Secondary | ICD-10-CM

## 2022-11-02 DIAGNOSIS — N401 Enlarged prostate with lower urinary tract symptoms: Secondary | ICD-10-CM

## 2022-11-02 DIAGNOSIS — R300 Dysuria: Secondary | ICD-10-CM | POA: Diagnosis present

## 2022-11-02 HISTORY — DX: Other fecal abnormalities: R19.5

## 2022-11-02 LAB — POCT URINALYSIS DIP (MANUAL ENTRY)
Glucose, UA: NEGATIVE mg/dL
Ketones, POC UA: NEGATIVE mg/dL
Nitrite, UA: NEGATIVE
Protein Ur, POC: >=300 mg/dL — AB
Spec Grav, UA: >=1.03 — AB (ref 1.010–1.025)
Urobilinogen, UA: 1 E.U./dL
pH, UA: 5.5 (ref 5.0–8.0)

## 2022-11-02 LAB — POCT UA - MICROSCOPIC ONLY

## 2022-11-02 LAB — POCT HEMOGLOBIN: Hemoglobin: 17.2 g/dL — AB (ref 11–14.6)

## 2022-11-02 MED ORDER — TAMSULOSIN HCL 0.4 MG PO CAPS: 0.4000 mg | ORAL_CAPSULE | Freq: Every day | ORAL | 0 refills | Status: DC

## 2022-11-02 NOTE — Assessment & Plan Note (Addendum)
BP elevated at 167/110. Son reports that his wife checks the BP at home and says the number is typically "normal"  - Patient to continue his medications as prescribed - Parameters to follow-up if persistently >140/90

## 2022-11-02 NOTE — Assessment & Plan Note (Signed)
Has not been able to get the prescription filled at pharmacy, not sure what is going on but refill provided and family confirmed address of pharmacy. If any issues with getting the medication, we will try a different pharmacy.

## 2022-11-02 NOTE — Patient Instructions (Signed)
Given the concerns for his bowels being darker and this pain, I have sent in a referral to GI so hopefully you will hear from them in the coming days/weeks. Make sure to go to the ER if significant fatigue, sudden bright red blood that is abundant in volume is coming from his stools.  Please keep an eye on his blood pressure. Call our office if persistently >140/90.

## 2022-11-02 NOTE — Assessment & Plan Note (Signed)
Has been present for 2 months and has associated decreased appetite, reassuringly has not had weight loss. Symptoms not improved with defecation. Reports prior normal colonoscopy. Given patient's age and these symptoms, feel that evaluation from GI is more prudent quickly to ensure nothing concerning like cancer. - Amb ref to GI - POC Hgb - CBC and CMP

## 2022-11-02 NOTE — Progress Notes (Signed)
    SUBJECTIVE:   CHIEF COMPLAINT / HPI:   Dark stool - Present for 2 months - Pain was worse last night - Mainly in the lower stomach and has associated bloating and reflux - Has not had any weight loss or fevers - Previously had colonoscopy without issue - Decreased appetite for the last 2 month - Defecating does not relieve the pain - Poops are not tarry and patient characterizes as his normal - Does not fully empty his colon and poops around 5 times per day - Denies straining or feeling like his stools are hard   Dysuria  - Has increased frequency - Burns in his genitals when he urinates, has been present for a long time - Also has diagnosed BPH with urinary frequency but has not been able to get his prescription filled for Flomax  HTN - Taking his BP medications - Takes BP sometimes at home and the son gets told it is "normal" but doesn't know what numbers   PERTINENT  PMH / PSH: Reviewed  OBJECTIVE:   BP (!) 158/112   Pulse 100   Wt 166 lb (75.3 kg)   SpO2 96%   BMI 27.62 kg/m   Gen: well-appearing, NAD CV: RRR, no m/r/g appreciated, no peripheral edema Pulm: CTAB, no wheezes/crackles GI: soft, TTP in the lower quadrants, no peritoneal signs, no rebound tenderness, no guarding  ASSESSMENT/PLAN:   Dark stools Has been present for 2 months and has associated decreased appetite, reassuringly has not had weight loss. Symptoms not improved with defecation. Reports prior normal colonoscopy. Given patient's age and these symptoms, feel that evaluation from GI is more prudent quickly to ensure nothing concerning like cancer. - Amb ref to GI - POC Hgb - CBC and CMP   BPH (benign prostatic hyperplasia) Has not been able to get the prescription filled at pharmacy, not sure what is going on but refill provided and family confirmed address of pharmacy. If any issues with getting the medication, we will try a different pharmacy.   Essential hypertension BP elevated at  167/110. Son reports that his wife checks the BP at home and says the number is typically "normal"  - Patient to continue his medications as prescribed - Parameters to follow-up if persistently >140/90   Dysuria Present for several months, doesn't seem like acute infection but does have some abnormalities on UA.  There is trace blood that has been present in the last few checks, may need to consider urology if it persists. - Urine culture today - Consider urology referral  Manuel Reyes, [place]

## 2022-11-03 ENCOUNTER — Telehealth: Payer: Self-pay

## 2022-11-03 LAB — COMPREHENSIVE METABOLIC PANEL
ALT: 38 IU/L (ref 0–44)
AST: 72 IU/L — ABNORMAL HIGH (ref 0–40)
Albumin/Globulin Ratio: 1.5 (ref 1.2–2.2)
Albumin: 4.5 g/dL (ref 3.9–4.9)
Alkaline Phosphatase: 98 IU/L (ref 44–121)
BUN/Creatinine Ratio: 14 (ref 10–24)
BUN: 11 mg/dL (ref 8–27)
Bilirubin Total: 0.6 mg/dL (ref 0.0–1.2)
CO2: 27 mmol/L (ref 20–29)
Calcium: 9.5 mg/dL (ref 8.6–10.2)
Chloride: 94 mmol/L — ABNORMAL LOW (ref 96–106)
Creatinine, Ser: 0.79 mg/dL (ref 0.76–1.27)
Globulin, Total: 3 g/dL (ref 1.5–4.5)
Glucose: 113 mg/dL — ABNORMAL HIGH (ref 70–99)
Potassium: 4.6 mmol/L (ref 3.5–5.2)
Sodium: 137 mmol/L (ref 134–144)
Total Protein: 7.5 g/dL (ref 6.0–8.5)
eGFR: 96 mL/min/{1.73_m2} (ref >59)

## 2022-11-03 LAB — CBC
Hematocrit: 54.5 % — ABNORMAL HIGH (ref 37.5–51.0)
Hemoglobin: 18.5 g/dL — ABNORMAL HIGH (ref 13.0–17.7)
MCH: 31.5 pg (ref 26.6–33.0)
MCHC: 33.9 g/dL (ref 31.5–35.7)
MCV: 93 fL (ref 79–97)
Platelets: 153 10*3/uL (ref 150–450)
RBC: 5.88 x10E6/uL — ABNORMAL HIGH (ref 4.14–5.80)
RDW: 13.3 % (ref 11.6–15.4)
WBC: 6 10*3/uL (ref 3.4–10.8)

## 2022-11-03 NOTE — Telephone Encounter (Signed)
Patients son calls nurse line in regards to Flomax.  The prescription was sent in yesterday after office visit, however son reports the medication is not at [facility].   I called the pharmacy and gave verbal prescription information as they reported having "no record" of prescription.   Son has been updated.

## 2022-11-04 LAB — URINE CULTURE: Organism ID, Bacteria: NO GROWTH

## 2022-11-08 ENCOUNTER — Telehealth: Payer: Self-pay | Admitting: Student

## 2022-11-08 NOTE — Telephone Encounter (Signed)
Patients son Ashok Cordia) dropped off a PCS form to be completed by provider. Last DOS: 11/02/2022. I am placing forms in Red team folder. Patients son would like to be called when ready for pick up.   SonGilford [phone].  Thanks!

## 2022-11-09 ENCOUNTER — Encounter: Payer: Self-pay | Admitting: Gastroenterology

## 2022-11-09 NOTE — Telephone Encounter (Signed)
Reviewed form and placed in PCP's box for completion.  .Ryane Konieczny R Kaelyn Innocent, CMA  

## 2022-11-14 NOTE — Telephone Encounter (Signed)
Patient's son called and informed that forms are ready for pick up. Copy made and placed in batch scanning. Original placed at front desk for pick up.   Talbot Grumbling, RN

## 2022-12-05 ENCOUNTER — Inpatient Hospital Stay (HOSPITAL_COMMUNITY)
Admission: EM | Admit: 2022-12-05 | Discharge: 2022-12-10 | DRG: 388 | Disposition: A | Discharge status: Home / Self Care | Payer: Medicaid Other | Attending: Family Medicine | Admitting: Family Medicine

## 2022-12-05 ENCOUNTER — Inpatient Hospital Stay (HOSPITAL_COMMUNITY): Payer: Medicaid Other

## 2022-12-05 ENCOUNTER — Emergency Department (HOSPITAL_COMMUNITY): Payer: Medicaid Other

## 2022-12-05 ENCOUNTER — Observation Stay (HOSPITAL_COMMUNITY): Payer: Medicaid Other

## 2022-12-05 ENCOUNTER — Encounter (HOSPITAL_COMMUNITY): Payer: Self-pay

## 2022-12-05 ENCOUNTER — Other Ambulatory Visit: Payer: Self-pay

## 2022-12-05 DIAGNOSIS — Z603 Acculturation difficulty: Secondary | ICD-10-CM | POA: Diagnosis present

## 2022-12-05 DIAGNOSIS — Z79899 Other long term (current) drug therapy: Secondary | ICD-10-CM

## 2022-12-05 DIAGNOSIS — Z8639 Personal history of other endocrine, nutritional and metabolic disease: Secondary | ICD-10-CM

## 2022-12-05 DIAGNOSIS — H1012 Acute atopic conjunctivitis, left eye: Secondary | ICD-10-CM | POA: Diagnosis not present

## 2022-12-05 DIAGNOSIS — K219 Gastro-esophageal reflux disease without esophagitis: Secondary | ICD-10-CM | POA: Diagnosis present

## 2022-12-05 DIAGNOSIS — J690 Pneumonitis due to inhalation of food and vomit: Secondary | ICD-10-CM | POA: Diagnosis present

## 2022-12-05 DIAGNOSIS — I10 Essential (primary) hypertension: Secondary | ICD-10-CM | POA: Diagnosis present

## 2022-12-05 DIAGNOSIS — N400 Enlarged prostate without lower urinary tract symptoms: Secondary | ICD-10-CM | POA: Diagnosis present

## 2022-12-05 DIAGNOSIS — K529 Noninfective gastroenteritis and colitis, unspecified: Secondary | ICD-10-CM | POA: Insufficient documentation

## 2022-12-05 DIAGNOSIS — E871 Hypo-osmolality and hyponatremia: Secondary | ICD-10-CM | POA: Diagnosis present

## 2022-12-05 DIAGNOSIS — R195 Other fecal abnormalities: Secondary | ICD-10-CM | POA: Diagnosis present

## 2022-12-05 DIAGNOSIS — K56609 Unspecified intestinal obstruction, unspecified as to partial versus complete obstruction: Secondary | ICD-10-CM

## 2022-12-05 DIAGNOSIS — E7800 Pure hypercholesterolemia, unspecified: Secondary | ICD-10-CM | POA: Diagnosis present

## 2022-12-05 DIAGNOSIS — E119 Type 2 diabetes mellitus without complications: Secondary | ICD-10-CM | POA: Diagnosis present

## 2022-12-05 DIAGNOSIS — I5032 Chronic diastolic (congestive) heart failure: Secondary | ICD-10-CM | POA: Diagnosis present

## 2022-12-05 DIAGNOSIS — A09 Infectious gastroenteritis and colitis, unspecified: Secondary | ICD-10-CM | POA: Diagnosis present

## 2022-12-05 DIAGNOSIS — R0902 Hypoxemia: Secondary | ICD-10-CM | POA: Diagnosis present

## 2022-12-05 DIAGNOSIS — I110 Hypertensive heart disease with heart failure: Secondary | ICD-10-CM | POA: Diagnosis present

## 2022-12-05 DIAGNOSIS — R0602 Shortness of breath: Secondary | ICD-10-CM | POA: Diagnosis present

## 2022-12-05 DIAGNOSIS — F32A Depression, unspecified: Secondary | ICD-10-CM | POA: Diagnosis present

## 2022-12-05 DIAGNOSIS — Z8711 Personal history of peptic ulcer disease: Secondary | ICD-10-CM

## 2022-12-05 DIAGNOSIS — J9601 Acute respiratory failure with hypoxia: Secondary | ICD-10-CM

## 2022-12-05 DIAGNOSIS — R569 Unspecified convulsions: Secondary | ICD-10-CM | POA: Diagnosis present

## 2022-12-05 DIAGNOSIS — G8929 Other chronic pain: Secondary | ICD-10-CM | POA: Diagnosis present

## 2022-12-05 DIAGNOSIS — K5669 Other partial intestinal obstruction: Principal | ICD-10-CM | POA: Diagnosis present

## 2022-12-05 DIAGNOSIS — Z89022 Acquired absence of left finger(s): Secondary | ICD-10-CM

## 2022-12-05 DIAGNOSIS — Z1152 Encounter for screening for COVID-19: Secondary | ICD-10-CM | POA: Diagnosis not present

## 2022-12-05 DIAGNOSIS — F0393 Unspecified dementia, unspecified severity, with mood disturbance: Secondary | ICD-10-CM | POA: Diagnosis present

## 2022-12-05 DIAGNOSIS — Z791 Long term (current) use of non-steroidal anti-inflammatories (NSAID): Secondary | ICD-10-CM

## 2022-12-05 DIAGNOSIS — D751 Secondary polycythemia: Secondary | ICD-10-CM | POA: Diagnosis present

## 2022-12-05 DIAGNOSIS — R0609 Other forms of dyspnea: Secondary | ICD-10-CM | POA: Diagnosis not present

## 2022-12-05 DIAGNOSIS — R6510 Systemic inflammatory response syndrome (SIRS) of non-infectious origin without acute organ dysfunction: Secondary | ICD-10-CM

## 2022-12-05 HISTORY — DX: Noninfective gastroenteritis and colitis, unspecified: K52.9

## 2022-12-05 HISTORY — DX: Unspecified intestinal obstruction, unspecified as to partial versus complete obstruction: K56.609

## 2022-12-05 LAB — POCT I-STAT 7, (LYTES, BLD GAS, ICA,H+H)
Acid-Base Excess: 3 mmol/L — ABNORMAL HIGH (ref 0.0–2.0)
Bicarbonate: 26 mmol/L (ref 20.0–28.0)
Calcium, Ion: 1.09 mmol/L — ABNORMAL LOW (ref 1.15–1.40)
HCT: 53 % — ABNORMAL HIGH (ref 39.0–52.0)
Hemoglobin: 18 g/dL — ABNORMAL HIGH (ref 13.0–17.0)
O2 Saturation: 98 %
Potassium: 3.6 mmol/L (ref 3.5–5.1)
Sodium: 129 mmol/L — ABNORMAL LOW (ref 135–145)
TCO2: 27 mmol/L (ref 22–32)
pCO2 arterial: 35.3 mmHg (ref 32–48)
pH, Arterial: 7.476 — ABNORMAL HIGH (ref 7.35–7.45)
pO2, Arterial: 101 mmHg (ref 83–108)

## 2022-12-05 LAB — CBC WITH DIFFERENTIAL/PLATELET
Abs Immature Granulocytes: 0.02 10*3/uL (ref 0.00–0.07)
Basophils Absolute: 0 10*3/uL (ref 0.0–0.1)
Basophils Relative: 1 %
Eosinophils Absolute: 0 10*3/uL (ref 0.0–0.5)
Eosinophils Relative: 0 %
HCT: 51.7 % (ref 39.0–52.0)
Hemoglobin: 18.2 g/dL — ABNORMAL HIGH (ref 13.0–17.0)
Immature Granulocytes: 0 %
Lymphocytes Relative: 15 %
Lymphs Abs: 0.9 10*3/uL (ref 0.7–4.0)
MCH: 32.2 pg (ref 26.0–34.0)
MCHC: 35.2 g/dL (ref 30.0–36.0)
MCV: 91.3 fL (ref 80.0–100.0)
Monocytes Absolute: 0.9 10*3/uL (ref 0.1–1.0)
Monocytes Relative: 15 %
Neutro Abs: 4 10*3/uL (ref 1.7–7.7)
Neutrophils Relative %: 69 %
Platelets: 131 10*3/uL — ABNORMAL LOW (ref 150–400)
RBC: 5.66 MIL/uL (ref 4.22–5.81)
RDW: 13.1 % (ref 11.5–15.5)
WBC: 5.9 10*3/uL (ref 4.0–10.5)
nRBC: 0 % (ref 0.0–0.2)

## 2022-12-05 LAB — TECHNOLOGIST SMEAR REVIEW: WBC MORPHOLOGY: INCREASED

## 2022-12-05 LAB — HEMOGLOBIN A1C
Hgb A1c MFr Bld: 5.8 % — ABNORMAL HIGH (ref 4.8–5.6)
Mean Plasma Glucose: 119.76 mg/dL

## 2022-12-05 LAB — GLUCOSE, CAPILLARY
Glucose-Capillary: 223 mg/dL — ABNORMAL HIGH (ref 70–99)
Glucose-Capillary: 149 mg/dL — ABNORMAL HIGH (ref 70–99)
Glucose-Capillary: 153 mg/dL — ABNORMAL HIGH (ref 70–99)
Glucose-Capillary: 128 mg/dL — ABNORMAL HIGH (ref 70–99)

## 2022-12-05 LAB — LIPASE, BLOOD: Lipase: 25 U/L (ref 11–51)

## 2022-12-05 LAB — COMPREHENSIVE METABOLIC PANEL
ALT: 24 U/L (ref 0–44)
AST: 34 U/L (ref 15–41)
Albumin: 3.6 g/dL (ref 3.5–5.0)
Alkaline Phosphatase: 76 U/L (ref 38–126)
Anion gap: 12 (ref 5–15)
BUN: 11 mg/dL (ref 8–23)
CO2: 23 mmol/L (ref 22–32)
Calcium: 8.7 mg/dL — ABNORMAL LOW (ref 8.9–10.3)
Chloride: 96 mmol/L — ABNORMAL LOW (ref 98–111)
Creatinine, Ser: 0.82 mg/dL (ref 0.61–1.24)
GFR, Estimated: >60 mL/min (ref >60)
Glucose, Bld: 216 mg/dL — ABNORMAL HIGH (ref 70–99)
Potassium: 4 mmol/L (ref 3.5–5.1)
Sodium: 131 mmol/L — ABNORMAL LOW (ref 135–145)
Total Bilirubin: 1.7 mg/dL — ABNORMAL HIGH (ref 0.3–1.2)
Total Protein: 7.1 g/dL (ref 6.5–8.1)

## 2022-12-05 LAB — RESP PANEL BY RT-PCR (RSV, FLU A&B, COVID)  RVPGX2
Influenza A by PCR: NEGATIVE
Influenza B by PCR: NEGATIVE
Resp Syncytial Virus by PCR: NEGATIVE
SARS Coronavirus 2 by RT PCR: NEGATIVE

## 2022-12-05 LAB — HIV ANTIBODY (ROUTINE TESTING W REFLEX): HIV Screen 4th Generation wRfx: NONREACTIVE

## 2022-12-05 LAB — TROPONIN I (HIGH SENSITIVITY)
Troponin I (High Sensitivity): 14 ng/L (ref <18)
Troponin I (High Sensitivity): 16 ng/L (ref <18)

## 2022-12-05 LAB — C-REACTIVE PROTEIN: CRP: 13 mg/dL — ABNORMAL HIGH (ref <1.0)

## 2022-12-05 MED ORDER — ENOXAPARIN SODIUM 40 MG/0.4ML IJ SOSY
40.0000 mg | PREFILLED_SYRINGE | INTRAMUSCULAR | Status: DC
  Administered 2022-12-06 – 2022-12-10 (×5): 40 mg via SUBCUTANEOUS
  Filled 2022-12-05 (×6): qty 0.4

## 2022-12-05 MED ORDER — SODIUM CHLORIDE 0.9 % IV SOLN: INTRAVENOUS | Status: DC

## 2022-12-05 MED ORDER — ALBUTEROL SULFATE (2.5 MG/3ML) 0.083% IN NEBU: 2.5000 mg | INHALATION_SOLUTION | RESPIRATORY_TRACT | Status: DC | PRN

## 2022-12-05 MED ORDER — ENOXAPARIN SODIUM 40 MG/0.4ML IJ SOSY: 40.0000 mg | PREFILLED_SYRINGE | INTRAMUSCULAR | Status: DC

## 2022-12-05 MED ORDER — ACETAMINOPHEN 325 MG PO TABS: 650.0000 mg | ORAL_TABLET | Freq: Four times a day (QID) | ORAL | Status: DC | PRN

## 2022-12-05 MED ORDER — LEVETIRACETAM IN NACL 500 MG/100ML IV SOLN
500.0000 mg | Freq: Two times a day (BID) | INTRAVENOUS | Status: DC
  Administered 2022-12-05 – 2022-12-10 (×11): 500 mg via INTRAVENOUS
  Filled 2022-12-05 (×12): qty 100

## 2022-12-05 MED ORDER — METRONIDAZOLE 500 MG/100ML IV SOLN
500.0000 mg | Freq: Three times a day (TID) | INTRAVENOUS | Status: DC
  Administered 2022-12-05 – 2022-12-06 (×6): 500 mg via INTRAVENOUS
  Filled 2022-12-05 (×6): qty 100

## 2022-12-05 MED ORDER — ACETAMINOPHEN 10 MG/ML IV SOLN
1000.0000 mg | Freq: Once | INTRAVENOUS | Status: AC
  Administered 2022-12-05: 1000 mg via INTRAVENOUS
  Filled 2022-12-05: qty 100

## 2022-12-05 MED ORDER — DIATRIZOATE MEGLUMINE & SODIUM 66-10 % PO SOLN
90.0000 mL | Freq: Once | ORAL | Status: AC
  Administered 2022-12-05: 90 mL via NASOGASTRIC
  Filled 2022-12-05: qty 90

## 2022-12-05 MED ORDER — ACETAMINOPHEN 650 MG RE SUPP: 650.0000 mg | Freq: Four times a day (QID) | RECTAL | Status: DC | PRN

## 2022-12-05 MED ORDER — ONDANSETRON HCL 4 MG/2ML IJ SOLN: 4.0000 mg | Freq: Once | INTRAMUSCULAR | Status: DC

## 2022-12-05 MED ORDER — IOHEXOL 350 MG/ML SOLN
75.0000 mL | Freq: Once | INTRAVENOUS | Status: AC | PRN
  Administered 2022-12-05: 75 mL via INTRAVENOUS

## 2022-12-05 MED ORDER — INSULIN ASPART 100 UNIT/ML IJ SOLN
0.0000 [IU] | INTRAMUSCULAR | Status: DC
  Administered 2022-12-05: 3 [IU] via SUBCUTANEOUS
  Administered 2022-12-05: 2 [IU] via SUBCUTANEOUS
  Administered 2022-12-06 – 2022-12-07 (×2): 1 [IU] via SUBCUTANEOUS
  Administered 2022-12-08: 2 [IU] via SUBCUTANEOUS

## 2022-12-05 MED ORDER — SODIUM CHLORIDE 0.9 % IV BOLUS
1000.0000 mL | Freq: Once | INTRAVENOUS | Status: AC
  Administered 2022-12-05: 1000 mL via INTRAVENOUS

## 2022-12-05 MED ORDER — LIDOCAINE HCL URETHRAL/MUCOSAL 2 % EX GEL
1.0000 | Freq: Four times a day (QID) | CUTANEOUS | Status: DC | PRN
  Filled 2022-12-05: qty 6

## 2022-12-05 MED ORDER — SODIUM CHLORIDE 0.9 % IV SOLN
1.0000 g | INTRAVENOUS | Status: AC
  Administered 2022-12-05 – 2022-12-07 (×3): 1 g via INTRAVENOUS
  Filled 2022-12-05 (×3): qty 10

## 2022-12-05 NOTE — Assessment & Plan Note (Deleted)
Stable and chronic - needs outpt sleep study - AM CBC

## 2022-12-05 NOTE — Assessment & Plan Note (Deleted)
Stop SSI and check CBGs q8h. Can restart SSI if needed.

## 2022-12-05 NOTE — ED Notes (Signed)
[id number] interpreter used to place NG tube

## 2022-12-05 NOTE — Assessment & Plan Note (Addendum)
Unclear if/how this may be contributing to his present clinical picture. 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 It seems at that time he was having PND but has been relatively symptom-free since that time. Is not on any diuretic or other HF therapies and current presentation does not seem consistent with CHF. There is mention of cardiomegaly on CXR read but no obvious edema, able to lie flat on the ED stretcher without worsening in respiratory status.  - Could consider echo if remainder of workup is unrevealing

## 2022-12-05 NOTE — Progress Notes (Signed)
FMTS Brief Progress Note  S: Patient was interviewed in presence of family member. Patient endorses abdominal pain of 3/10 that is improved from 10/10 yesterday. He also notes pain on R nare where NG tube was placed. He reports no other somatic complaints.  O: BP 129/81 (BP Location: Right Arm)   Pulse 81   Temp 97.8 F (36.6 C)   Resp 16   Ht '5\' 4"'$  (1.626 m)   Wt 79.4 kg   SpO2 96%   BMI 30.04 kg/m   General:  appears moderately uncomfortable HEENT: normocephalic and atraumatic Respiratory: CTAB anteriorly, normal respiratory effort, on RA, and with audible upper airway wheezing Extremities: moving all extremities spontaneously Gastrointestinal:  moderately distended and grossly tender, NG tube present on R nare Cardiovascular: regular rate  A/P: - Orders reviewed. Labs for AM ordered, which was adjusted as needed.  - IV tylenol and topical lidocaine placed for nare pain  Camelia Phenes, MD 12/05/2022, 10:37 PM PGY-1, [facility] Night Resident  Please page [phone] with questions.

## 2022-12-05 NOTE — ED Notes (Signed)
ED TO INPATIENT HANDOFF REPORT  ED Nurse Name and Phone #: Ivin Poot Name/Age/Gender Manuel Reyes 72 y.o. male Room/Bed: 033C/033C  Code Status   Code Status: Full Code  Home/SNF/Other Home Patient oriented to: self, place, time, and situation Is this baseline? Yes   Triage Complete: Triage complete  Chief Complaint Small bowel obstruction ([place]) [G25.427]  Triage Note Pt BIBA to ED for SOB and CP x2 days. SpO2 86% RA upon EMS arrival.Pt vomited x1 with EMS. Nepali speaking    Given PTA:  2g mag  '125mg'$  solumedrol  2 duonebs  '4mg'$  zofran   40rr 162/90 107    Allergies No Known Allergies  Level of Care/Admitting Diagnosis ED Disposition     ED Disposition  Admit   Condition  --   Comment  Hospital Area: [place] [[id number]]  Level of Care: Med-Surg [16]  May place patient in observation at [facility] or [place] if equivalent level of care is available:: No  Covid Evaluation: Asymptomatic - no recent exposure (last 10 days) testing not required  Diagnosis: Small bowel obstruction [facility]) [[id number]]  Admitting Physician: Eppie Gibson [[id number]]  Attending Physician: Leeanne Rio [phone]          B Medical/Surgery History Past Medical History:  Diagnosis Date   Abnormal weight loss 10/11/2018   Arm pain 03/01/2020   Depression    Diabetes ([place])    no medications taken   Flank pain 07/28/2016   GERD (gastroesophageal reflux disease)    High cholesterol    Hypertension    Neuromuscular disorder ([place])    Polycythemia 03/01/2020   Seizures ([place])    last one 2 years ago   Stomach ulcer    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 Right nare 60 cm 12/05/22  0830  Right nare  less than 1   Incision (Closed) 04/16/16 Finger (Comment which one) Left 04/16/16  1859  -- 2424            Intake/Output Last 24 hours No intake or output data in the 24 hours ending 12/05/22 0839  Labs/Imaging Results for orders placed or performed during the hospital encounter of 12/05/22 (from the past 48 hour(s))  CBC with Differential     Status: Abnormal   Collection Time: 12/05/22  4:54 AM  Result Value Ref Range   WBC 5.9 4.0 - 10.5 K/uL   RBC 5.66 4.22 - 5.81 MIL/uL   Hemoglobin 18.2 (H) 13.0 - 17.0 g/dL   HCT 51.7 39.0 - 52.0 %   MCV 91.3 80.0 - 100.0 fL   MCH 32.2 26.0 -  34.0 pg   MCHC 35.2 30.0 - 36.0 g/dL   RDW 13.1 11.5 - 15.5 %   Platelets 131 (L) 150 - 400 K/uL    Comment: REPEATED TO VERIFY   nRBC 0.0 0.0 - 0.2 %   Neutrophils Relative % 69 %   Neutro Abs 4.0 1.7 - 7.7 K/uL   Lymphocytes Relative 15 %   Lymphs Abs 0.9 0.7 - 4.0 K/uL   Monocytes Relative 15 %   Monocytes Absolute 0.9 0.1 - 1.0 K/uL   Eosinophils Relative 0 %   Eosinophils Absolute 0.0 0.0 - 0.5 K/uL   Basophils Relative 1 %   Basophils Absolute 0.0 0.0 - 0.1 K/uL   Immature Granulocytes 0 %   Abs Immature Granulocytes 0.02 0.00 - 0.07 K/uL    Comment: Performed at [facility] [address], [place], [place] [address]  Comprehensive metabolic panel     Status: Abnormal   Collection Time: 12/05/22  4:54 AM  Result Value Ref Range   Sodium 131 (L) 135 -  145 mmol/L   Potassium 4.0 3.5 - 5.1 mmol/L   Chloride 96 (L) 98 - 111 mmol/L   CO2 23 22 - 32 mmol/L   Glucose, Bld 216 (H) 70 - 99 mg/dL    Comment: Glucose reference range applies only to samples taken after fasting for at least 8 hours.   BUN 11 8 - 23 mg/dL   Creatinine, Ser 0.82 0.61 - 1.24 mg/dL   Calcium 8.7 (L) 8.9 - 10.3 mg/dL   Total Protein 7.1 6.5 - 8.1 g/dL   Albumin 3.6 3.5 - 5.0 g/dL   AST 34 15 - 41 U/L   ALT 24 0 - 44 U/L   Alkaline Phosphatase 76 38 - 126 U/L   Total Bilirubin 1.7 (H) 0.3 - 1.2 mg/dL   GFR, Estimated >60 >60 mL/min    Comment: (NOTE) Calculated using the CKD-EPI Creatinine Equation (2021)    Anion gap 12 5 - 15    Comment: Performed at [facility] [address], [place], [place] [address]  Lipase, blood     Status: None   Collection Time: 12/05/22  4:54 AM  Result Value Ref Range   Lipase 25 11 - 51 U/L    Comment: Performed at [facility] [address], [place], [place] [address]  Resp panel by RT-PCR (RSV, Flu A&B, Covid) Anterior Nasal Swab     Status: None   Collection Time: 12/05/22  5:00 AM   Specimen: Anterior Nasal Swab  Result Value Ref Range   SARS Coronavirus 2 by RT PCR NEGATIVE NEGATIVE   Influenza A by PCR NEGATIVE NEGATIVE   Influenza B by PCR NEGATIVE NEGATIVE    Comment: (NOTE) The Xpert Xpress SARS-CoV-2/FLU/RSV plus assay is intended as an aid in the diagnosis of influenza from Nasopharyngeal swab specimens and should not be used as a sole basis for treatment. Nasal washings and aspirates are unacceptable for Xpert Xpress SARS-CoV-2/FLU/RSV testing.  Fact Sheet for Patients: EntrepreneurPulse.com.au  Fact Sheet for Healthcare Providers: IncredibleEmployment.be  This test is not yet approved or cleared by the [place] FDA and has been authorized for detection and/or diagnosis of SARS-CoV-2 by FDA under an Emergency Use Authorization (EUA). This EUA will  remain in effect (meaning this test can be used) for the duration of the COVID-19 declaration under Section 564(b)(1) of the Act, 21 U.S.C. section 360bbb-3(b)(1), unless the authorization is terminated or revoked.  Resp Syncytial Virus by PCR NEGATIVE NEGATIVE    Comment: (NOTE) Fact Sheet for Patients: EntrepreneurPulse.com.au  Fact Sheet for Healthcare Providers: IncredibleEmployment.be  This test is not yet approved or cleared by the [place] FDA and has been authorized for detection and/or diagnosis of SARS-CoV-2 by FDA under an Emergency Use Authorization (EUA). This EUA will remain in effect (meaning this test can be used) for the duration of the COVID-19 declaration under Section 564(b)(1) of the Act, 21 U.S.C. section 360bbb-3(b)(1), unless the authorization is terminated or revoked.  Performed at [facility], [place] [address], [place], [place] [address]    CT ABDOMEN PELVIS W CONTRAST  Result Date: 12/05/2022 CLINICAL DATA:  72 year old male with history of acute onset of nonlocalized abdominal pain, shortness of breath and chest pain for the past 2 days. Vomiting. EXAM: CT ABDOMEN AND PELVIS WITH CONTRAST TECHNIQUE: Multidetector CT imaging of the abdomen and pelvis was performed using the standard protocol following bolus administration of intravenous contrast. RADIATION DOSE REDUCTION: This exam was performed according to the departmental dose-optimization program which includes automated exposure control, adjustment of the mA and/or kV according to patient size and/or use of iterative reconstruction technique. CONTRAST:  71m OMNIPAQUE IOHEXOL 350 MG/ML SOLN COMPARISON:  CT of the abdomen and pelvis 10/18/2018. FINDINGS: Lower chest: Esophagus appears patulous and fluid-filled. Mild scarring in the lung bases bilaterally. Hepatobiliary: Diffuse low attenuation throughout the hepatic parenchyma, indicative of a background of  hepatic steatosis. No suspicious cystic or solid hepatic lesions. No intra or extrahepatic biliary ductal dilatation. Gallbladder is unremarkable in appearance. Pancreas: No pancreatic mass. No pancreatic ductal dilatation. No pancreatic or peripancreatic fluid collections or inflammatory changes. Spleen: Unremarkable. Adrenals/Urinary Tract: Subcentimeter low-attenuation lesion in the interpolar region of the left kidney, too small to characterize, but statistically likely a cyst (no imaging follow-up recommended). Right kidney and bilateral adrenal glands are normal in appearance. No hydroureteronephrosis. Urinary bladder is unremarkable in appearance. Stomach/Bowel: Stomach is distended and fluid-filled with an air-fluid level. Proximal small bowel is distended (up to 4 cm in diameter) with multiple air-fluid levels and some fecalization of small bowel contents. Colon is nearly completely decompressed, as is the distal small bowel. There is a relatively gradual transition to this nondilated small bowel, suggesting against frank mechanical obstruction. There are several regions of the mid small bowel which appear thickened with increased mucosal hyperenhancement and hypervascularity in the associated small bowel mesentery, best appreciated on coronal images 72-91 of series 6. Vascular/Lymphatic: Aortic atherosclerosis, without evidence of aneurysm or dissection in the abdominal or pelvic vasculature. No lymphadenopathy noted in the abdomen or pelvis. Reproductive: Prostate gland and seminal vesicles are unremarkable in appearance. Other: No significant volume of ascites.  No pneumoperitoneum. Musculoskeletal: There are no aggressive appearing lytic or blastic lesions noted in the visualized portions of the skeleton. Chronic appearing T11 compression fracture with 25% loss of anterior vertebral body height. IMPRESSION: 1. Findings are compatible with small bowel obstruction, with gradual transition to nondilated  small bowel in the region of the jejunum or proximal ileum, where there are segments of the small bowel which appears thickened and inflamed. The overall appearance suggests potential enteritis (as described above), with mechanical obstruction less likely but not entirely excluded. Surgical consultation is recommended. 2. Hepatic steatosis. 3. Aortic atherosclerosis. 4. Additional incidental findings, as above. Electronically Signed   By: DVinnie LangtonM.D.   On: 12/05/2022 06:27   DG Chest Port 1 View  Result Date: 12/05/2022 CLINICAL  DATA:  72 year old male with shortness of breath and vomiting. EXAM: PORTABLE CHEST 1 VIEW COMPARISON:  CT Chest, Abdomen, and Pelvis today are reported separately. 08/06/2017 and earlier. FINDINGS: Portable AP semi upright view at 0502 hours. Lordotic positioning today. Lung volumes and mediastinal contours do not appear significantly changed from 2018, with mild cardiomegaly at that time. Some chronic lung base scarring. No pneumothorax, pulmonary edema or definite acute pulmonary opacity. Visualized tracheal air column is within normal limits. No acute osseous abnormality identified. Negative visible bowel gas. IMPRESSION: Cardiomegaly and lung base scarring. No acute cardiopulmonary abnormality. Electronically Signed   By: Genevie Ann M.D.   On: 12/05/2022 05:23    Pending Labs Unresulted Labs (From admission, onward)     Start     Ordered   12/06/22 0500  Comprehensive metabolic panel  Tomorrow morning,   R        12/05/22 0800   12/05/22 0800  Hemoglobin A1c  Once,   R        12/05/22 0800   12/05/22 0754  C-reactive protein  Once,   R        12/05/22 0800   12/05/22 0753  Blood gas, arterial  Once,   R        12/05/22 0800   12/05/22 0752  Culture, blood (Routine X 2) w Reflex to ID Panel  BLOOD CULTURE X 2,   R (with TIMED occurrences)      12/05/22 0800   12/05/22 0750  HIV Antibody (routine testing w rflx)  (HIV Antibody (Routine testing w reflex) panel)   Once,   R        12/05/22 0800            Vitals/Pain Today's Vitals   12/05/22 0500 12/05/22 0700 12/05/22 0800 12/05/22 0836  BP: 113/79 (!) 134/91 (!) 167/103   Pulse: (!) 113 100 (!) 103   Resp: (!) 23 (!) 22 (!) 22   Temp:    99 F (37.2 C)  TempSrc:    Oral  SpO2: 93% 94% 95%   Weight:      Height:      PainSc:        Isolation Precautions No active isolations  Medications Medications  levETIRAcetam (KEPPRA) IVPB 500 mg/100 mL premix (500 mg Intravenous New Bag/Given 12/05/22 0834)  acetaminophen (TYLENOL) tablet 650 mg (has no administration in time range)    Or  acetaminophen (TYLENOL) suppository 650 mg (has no administration in time range)  cefTRIAXone (ROCEPHIN) 1 g in sodium chloride 0.9 % 100 mL IVPB (has no administration in time range)  sodium chloride 0.9 % bolus 1,000 mL (has no administration in time range)  0.9 %  sodium chloride infusion (has no administration in time range)  metroNIDAZOLE (FLAGYL) IVPB 500 mg (has no administration in time range)  albuterol (PROVENTIL) (2.5 MG/3ML) 0.083% nebulizer solution 2.5 mg (has no administration in time range)  insulin aspart (novoLOG) injection 0-9 Units (has no administration in time range)  enoxaparin (LOVENOX) injection 40 mg (has no administration in time range)  iohexol (OMNIPAQUE) 350 MG/ML injection 75 mL (75 mLs Intravenous Contrast Given 12/05/22 0609)    Mobility walks     Focused Assessments gastro   R Recommendations: See Admitting Provider Note  Report given to:   Additional Notes: haven't been able to locate mastisol, pt could use some to help the tape on the nose for the NG tube stick better

## 2022-12-05 NOTE — Assessment & Plan Note (Addendum)
-   Transition back to home PO keppra - Seizure precautions

## 2022-12-05 NOTE — Hospital Course (Addendum)
Manuel Reyes is a 72 y.o. male who was admitted for small bowel obstruction. PMH significant for HTN, HLD, HFpEF, GERD, T2DM, BPH, seizures, depression, and dementia.  Partial Small Bowel Obstruction Presented with 1 day of abdominal pain, vomiting, and fever. CT abd/pelvis showed small bowel obstruction likely secondary to enteritis. Gen surg followed the patient and he was managed non-operatively with NG tube and bowel rest. Pt subsequently improved, having regular BM's, tolerating regular diet, and abdominal exam stable. At time of discharge, pt was tolerating PO and restarted on home PO medications.  Enteritis  rule-out Aspiration Pneumonia Patient endorsed SOB on arrival and met SIRS criteria. CXR without acute findings but he was covered with Ceftriaxone and Flagyl for possible aspiration pneumonia. Pt subsequently had normal respiratory status and did NOT think he truly had PNA. Pt also had enteritis that likely led to SBO formation. Pt finished a 3 day course of CTX and flagyl for enteritis.  Hx of HFpEF Given initial dyspnea on presentation, echo was obtained and showed EF [id number] and no diastolic dysfunction (improved from prior echo).   Polycythemia Noted to have chronic and stable polycythemia. Likely need outpt sleep study.  Items for PCP follow-up 1) Chronic polycythemia-- Needs sleep study outpt. 2) Held Amlodipine (due to NPO status) and restart home Zestoretic at time of discharge. Restart home amlodipine if able to tolerate at PCP follow-up. 3) Home gabapentin was not restarted (clarify if he was truly getting this filled and taking it).

## 2022-12-05 NOTE — Assessment & Plan Note (Addendum)
Initially met SIRs criteria on admission, suspect enteritis leading to SBO developing.  - s/p 3 day course CTX and flagyl

## 2022-12-05 NOTE — H&P (Signed)
Hospital Admission History and Physical Service Pager: [phone]  Patient name: Manuel Reyes [facility] Medical record number: [id number] Date of Birth: 03-19-1951 Age: 72 y.o. Gender: male  Primary Care Provider: Eppie Gibson, MD Consultants: General Surgery Code Status: Full which was confirmed with family   Preferred Emergency Contact:  Yarnell, Arvidson (Son) [phone] [facility])   Chief Complaint: Shortness of breath, vomiting  Assessment and Plan: Brantly Keaten Mashek is a 72 y.o. male presenting with vomiting, abdominal distention, and shortness of breath. Differential for this patient's presentation of this includes SBO +/- superimposed aspiration pneumonia. Source of SBO is a bit unclear, though most likely 2/2 enteritis given imaging findings and clinical history of fever, vomiting, and diarrhea. Malignancy cannot be ruled out, though, given history of ??dark stool reported during clinic visit ~1 mo ago.  * Small bowel obstruction ([place]) As identified on imaging. Imaging suggestive of enteritis vs mechanical obstruction. No history of abdominal surgery, enteritis seems most likely, especially with clinical history of vomiting/fever/diarrhea. Abdomen markedly distended to my exam. As above, will need GI workup for neoplasm once SBO resolves given history of dark stools. Has outpt appt next week.  - Admit to med-surg - NG tube to low intermittent suction - NPO - Fluid resuscitation with NS ordered - Gen surg consulted by EDP--appreciate their input  - Tylenol for analgesia and antipyresis, avoid opioids - CRP - ABG to look for metabolic acidosis. CO2 nl on chemistry, but he may be compensating with his tachypnea - Given concomitant respiratory symptoms suggestive of PNA, I think going ahead and covering with broad spectrum abx is prudent-- CTX and Flagyl ordered  SIRS (systemic inflammatory response syndrome) ([place]) Meeting criteria for tachycardia and tachypnea--also with reported  fever at home last night. Likely source is enteritis/SBO though concomitant respiratory infection (likely from aspiration) cannot be ruled out. WBC reassuringly WNL. Chest imaging overall reassuring though he has crackles in the R base and is high risk for aspiration with his vomiting and baseline dementia.  - Blood cultures x2 - CTX and Flagyl as above - ABG as above   Acute hypoxic respiratory failure ([place]) By history seems to have started overnight after a vomiting episode, ?aspiration event. No smoking history or known lung disease. Received nebs, steroids, and magnesium with EMS. No wheeze appreciated upon arrival to the ED. Currently requiring 4L O2. 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 Presumably this was decades ago in his home country of [place]. However, given the acute onset of symptoms after vomiting, lack of weight loss or other constitutional syptoms and reassuring imaging, I am much less concerned for reactivation TB at this time.  - Abx coverage as above   - Blood gas as above - Wean O2 as tolerated - Incentive spirometer  Polycythemia By chart review, would appear to be a chronic issue. No history suggestive of symptomatic polycythemia vera. No known history of chronic pulmonary disease and has had an echo without evidence of intracardiac shunting. Given chronicity, doubt myeloproliferative neoplasm. Likely causes would seem to be familial polycythemia vs undiagnosed OSA driving his polycythemia and htn.  - Peripheral smear - AM CBC - Needs outpt sleep study   Seizure-like activity ([place]) Longstanding issue---no great history here but no recent seizure activity. Obviously is at increased risk for seizure while acutely ill.  - Will give home Keppra dose IV while NPO, transition  to PO when able - Seizure precautions  Essential hypertension Initially normotensive,  BP creeping up. Holding meds while NPO and tube to suction.  - Restart home meds once able - Consider IV agents if BP >130/865  Chronic diastolic heart failure ([place]) Unclear if/how this may be contributing to his present clinical picture. 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 It seems at that time he was having PND but has been relatively symptom-free since that time. Is not on any diuretic or other HF therapies and current presentation does not seem consistent with CHF. There is mention of cardiomegaly on CXR read but no obvious edema, able to lie flat on the ED stretcher without worsening in respiratory status.  - Could consider echo if remainder of workup is unrevealing   History of diabetes mellitus Documented in chart. However, does not seem that he has been on any diabetes meds as an outpatient. Initial glucose in the 200s. At risk for hyperglycemia s/p Solumedrol administration. - A1c - CBGs/sSSI -- if glucoses/A1c are grossly normal, can discontinue - Restart statin when no longer NPO/tube to suction   Chronic Stable Conditions Depression- restart venlafaxine when able  HLD- restart statin when able BPH- restart flomax when able GERD- restart omeprazole when able    FEN/GI: NPO VTE Prophylaxis: Lovenox  Disposition: Med-Surg  History of Present Illness:  Manuel Reyes is a 72 y.o. male presenting with abdominal distention, vomiting, diarrhea, fever suggestive of SBO, also with acute hypoxic respiratory failure with new O2 requirement to 4 L.  Past medical history is significant for seizures, recent dark stools, hypertension, diabetes, ?tuberculosis.  The patient has some dementia at baseline and only speaks [place].  History obtained with the assistance of a [place] phone interpreter and the patient's son.  Given the patient's baseline dementia, much of the history is provided by his son.  Unfortunately, his son is less familiar with his medications and  is especially unfamiliar with his remote history.  The patient was seen in our clinic about a month ago with dark stools and chronic abdominal pain.  Referral was made to GI but he has not been to their office yet.  This visit is scheduled for 2/12.  Starting yesterday, he began to develop a fever to 102.6, vomiting, and nonbloody diarrhea.  He then awoke overnight with shortness of breath after a vomiting episode.  He does not have any history of smoking, drug use, or known pulmonary disease.  He has no known sick contacts and no recent travel history.  When asked about his documented history of tuberculosis, the patient is unable to provide any history and his son says that he does not know anything about this.  Unclear where/when/how this was diagnosed and if or how it was treated.  No recent weight loss, fatigue, change in mentation.  EMS was called by his son this morning, on arrival found to be hypoxic.  Administered DuoNebs, Solu-Medrol, magnesium and reported that the patient's respiratory status improved after these interventions.   In the ED, patient had a relatively normal chest x-ray, negative viral panel, CT abdomen pelvis which showed small bowel obstruction secondary to enteritis versus mechanical obstruction.  The EDP consulted general surgery who have agreed to see the patient and [facility] MTS was consulted for admission.  Review Of Systems: Per HPI with the following additions:  Unable to complete due to language barrier  Pertinent Past Medical History: As detailed above, seizures, polycythemia, recent dark stools,  HTN, DM2, dementia.  Remainder reviewed in history tab.   Pertinent Past Surgical History: None  Remainder reviewed in history tab.   Pertinent Social History: Tobacco use: Yes/No/Former Alcohol use: Occasional Other Substance use: None Lives with Son  Pertinent Family History: Non-contributory  Remainder reviewed in history tab.   Important Outpatient  Medications: Keppra Remainder reviewed in medication history.   Objective: BP (!) 167/103   Pulse (!) 103   Temp 99 F (37.2 C) (Oral)   Resp (!) 22   Ht '5\' 4"'$  (1.626 m)   Wt 79.4 kg   SpO2 95%   BMI 30.04 kg/m  Exam: General: Minimally interactive, at [place] baseline per son, does follow commands well with prompting from his son Eyes: EOMs intact, sclerae anicteric ENTM: MMM Neck: Supple and without LAD Cardiovascular: Tachycardic, regular, without murmur Respiratory: Tachypneic on 4L, crackles in RLL Gastrointestinal: Abdomen distended and hypertympanic, diffusely tender but without rebound or guarding MSK: Without edema or deformity Derm: Without rash or excoriation Neuro: At baseline per son, follows commands and moves all extremities independently Psych: At baseline   Labs:  CBC BMET  Recent Labs  Lab 12/05/22 0454  WBC 5.9  HGB 18.2*  HCT 51.7  PLT 131*   Recent Labs  Lab 12/05/22 0454  NA 131*  K 4.0  CL 96*  CO2 23  BUN 11  CREATININE 0.82  GLUCOSE 216*  CALCIUM 8.7*    Pertinent additional labs .  EKG: Not previously obtained. Have ordered.    Imaging Studies Performed:  CXR with cardiomegaly and lung base scarring. Otherwise without cardiopulmonary abnormality.   CT Abd/Pelv with SBO, per rads report overall appearance suggestive of potential eneteritis, less likely mechanical obstruction. Fluid-filled, patulous esophagus. Chronic appearing T11 compression fracture with 25% loss of anterior vertebral body height.    Eppie Gibson, MD 12/05/2022, 9:04 AM PGY-2, [facility] pager: [phone], text pages welcome Secure chat group [place]

## 2022-12-05 NOTE — ED Provider Notes (Signed)
[facility] Provider Note   CSN: [id number] Arrival date & time: 12/05/22  9379     History  Chief Complaint  Patient presents with   Shortness of Breath    Jaydence Raegan Sipp is a 72 y.o. male.  The history is provided by the patient. A language interpreter was used.  Shortness of Breath He has history of hypertension, diabetes, hyperlipidemia, seizures, tuberculosis and comes in because of difficulty breathing.  He apparently started running fever yesterday as high as 102.6 with associated chills and sweats.  There has not been any cough but he has been vomiting.  He has also had some worsening of chronic abdominal pain which is mainly across the lower abdomen but present diffusely.  He has been referred to a gastroenterologist but has not seen the gastroenterologist yet.  He has had diarrhea as well.  There have been no known sick contacts.  EMS noted hypoxia and gave him nebulizer treatments with albuterol and ipratropium, intravenous methylprednisolone, intravenous magnesium.  He is doing better following these.   Home Medications Prior to Admission medications   Medication Sig Start Date End Date Taking? Authorizing Provider  amLODipine (NORVASC) 10 MG tablet Take 1 tablet (10 mg total) by mouth daily. 03/14/21   Simmons-Robinson, Makiera, MD  cyclobenzaprine (FLEXERIL) 5 MG tablet Take 5 mg by mouth 3 (three) times daily as needed. 03/14/21   [provider]  diclofenac (VOLTAREN) 75 MG EC tablet TAKE ONE TABLET BY MOUTH TWICE DAILY AS NEEDED 12/02/18   Guadalupe Dawn, MD  donepezil (ARICEPT) 10 MG tablet Take 1 tablet (10 mg total) by mouth at bedtime. 03/14/21   Simmons-Robinson, Makiera, MD  gabapentin (NEURONTIN) 100 MG capsule TAKE ONE CAPSULE BY MOUTH THREE TIMES A Day 02/07/22   Simmons-Robinson, Riki Sheer, MD  gabapentin (NEURONTIN) 600 MG tablet Take 0.5 tablets (300 mg total) by mouth at bedtime. 08/08/22 08/03/23  Eppie Gibson, MD  levETIRAcetam (KEPPRA) 500 MG tablet TAKE ONE TABLET BY MOUTH TWICE DAILY 05/31/22   Eppie Gibson, MD  lisinopril-hydrochlorothiazide (ZESTORETIC) 20-25 MG tablet Take 1 tablet by mouth daily. 10/04/22   Eppie Gibson, MD  meloxicam (MOBIC) 7.5 MG tablet Take 1 tablet (7.5 mg total) by mouth daily. 11/18/21   Alcus Dad, MD  memantine (NAMENDA) 5 MG tablet TAKE ONE TABLET BY MOUTH TWICE DAILY 05/31/22   Eppie Gibson, MD  omeprazole (PRILOSEC) 20 MG capsule TAKE ONE CAPSULE BY MOUTH EVERY DAY 09/04/22   Eppie Gibson, MD  rosuvastatin (CRESTOR) 40 MG tablet Take 1 tablet (40 mg total) by mouth daily. 11/21/21   Ezequiel Essex, MD  SENNA PLUS 8.6-50 MG tablet TAKE ONE TABLET BY MOUTH EVERY DAY 10/04/22   Eppie Gibson, MD  tamsulosin (FLOMAX) 0.4 MG CAPS capsule Take 1 capsule (0.4 mg total) by mouth daily. 11/02/22   Lilland, Alana, DO  venlafaxine XR (EFFEXOR-XR) 37.5 MG 24 hr capsule Take 1 by mouth with breakfast in the morning. 08/28/22   Alen Bleacher, MD      Allergies    Patient has no known allergies.    Review of Systems   Review of Systems  Respiratory:  Positive for shortness of breath.   All other systems reviewed and are negative.   Physical Exam Updated Vital Signs BP 125/89   Pulse (!) 110   Temp 98.2 F (36.8 C) (Axillary)   Resp (!) 25   Ht '5\' 4"'$  (1.626 m)  Wt 79.4 kg   SpO2 95%   BMI 30.04 kg/m  Physical Exam Vitals and nursing note reviewed.   72 year old male, resting comfortably and in no acute distress. Vital signs are significant for elevated heart rate and respiratory rate. Oxygen saturation is 95%, which is normal, but only while getting supplemental oxygen.  He requires 4 L nasal oxygen to keep from getting hypoxic. Head is normocephalic and atraumatic. PERRLA, EOMI. Oropharynx is clear. Neck is nontender and supple without adenopathy or JVD. Back is nontender and there is no CVA tenderness. Lungs have a slightly prolonged  exhalation phase without overt rales, wheezes, or rhonchi. Chest is nontender. Heart has regular rate and rhythm without murmur. Abdomen is soft, mildly distended, and mildly tender diffusely.  There is no rebound or guarding. Extremities have no cyanosis or edema, full range of motion is present. Skin is warm and dry without rash. Neurologic: Mental status is normal, cranial nerves are intact, moves all extremities equally.  ED Results / Procedures / Treatments   Labs (all labs ordered are listed, but only abnormal results are displayed) Labs Reviewed  CBC WITH DIFFERENTIAL/PLATELET - Abnormal; Notable for the following components:      Result Value   Hemoglobin 18.2 (*)    Platelets 131 (*)    All other components within normal limits  COMPREHENSIVE METABOLIC PANEL - Abnormal; Notable for the following components:   Sodium 131 (*)    Chloride 96 (*)    Glucose, Bld 216 (*)    Calcium 8.7 (*)    Total Bilirubin 1.7 (*)    All other components within normal limits  RESP PANEL BY RT-PCR (RSV, FLU A&B, COVID)  RVPGX2  LIPASE, BLOOD   Radiology CT ABDOMEN PELVIS W CONTRAST  Result Date: 12/05/2022 CLINICAL DATA:  72 year old male with history of acute onset of nonlocalized abdominal pain, shortness of breath and chest pain for the past 2 days. Vomiting. EXAM: CT ABDOMEN AND PELVIS WITH CONTRAST TECHNIQUE: Multidetector CT imaging of the abdomen and pelvis was performed using the standard protocol following bolus administration of intravenous contrast. RADIATION DOSE REDUCTION: This exam was performed according to the departmental dose-optimization program which includes automated exposure control, adjustment of the mA and/or kV according to patient size and/or use of iterative reconstruction technique. CONTRAST:  68m OMNIPAQUE IOHEXOL 350 MG/ML SOLN COMPARISON:  CT of the abdomen and pelvis 10/18/2018. FINDINGS: Lower chest: Esophagus appears patulous and fluid-filled. Mild scarring in the  lung bases bilaterally. Hepatobiliary: Diffuse low attenuation throughout the hepatic parenchyma, indicative of a background of hepatic steatosis. No suspicious cystic or solid hepatic lesions. No intra or extrahepatic biliary ductal dilatation. Gallbladder is unremarkable in appearance. Pancreas: No pancreatic mass. No pancreatic ductal dilatation. No pancreatic or peripancreatic fluid collections or inflammatory changes. Spleen: Unremarkable. Adrenals/Urinary Tract: Subcentimeter low-attenuation lesion in the interpolar region of the left kidney, too small to characterize, but statistically likely a cyst (no imaging follow-up recommended). Right kidney and bilateral adrenal glands are normal in appearance. No hydroureteronephrosis. Urinary bladder is unremarkable in appearance. Stomach/Bowel: Stomach is distended and fluid-filled with an air-fluid level. Proximal small bowel is distended (up to 4 cm in diameter) with multiple air-fluid levels and some fecalization of small bowel contents. Colon is nearly completely decompressed, as is the distal small bowel. There is a relatively gradual transition to this nondilated small bowel, suggesting against frank mechanical obstruction. There are several regions of the mid small bowel which appear thickened with increased mucosal  hyperenhancement and hypervascularity in the associated small bowel mesentery, best appreciated on coronal images 72-91 of series 6. Vascular/Lymphatic: Aortic atherosclerosis, without evidence of aneurysm or dissection in the abdominal or pelvic vasculature. No lymphadenopathy noted in the abdomen or pelvis. Reproductive: Prostate gland and seminal vesicles are unremarkable in appearance. Other: No significant volume of ascites.  No pneumoperitoneum. Musculoskeletal: There are no aggressive appearing lytic or blastic lesions noted in the visualized portions of the skeleton. Chronic appearing T11 compression fracture with 25% loss of anterior  vertebral body height. IMPRESSION: 1. Findings are compatible with small bowel obstruction, with gradual transition to nondilated small bowel in the region of the jejunum or proximal ileum, where there are segments of the small bowel which appears thickened and inflamed. The overall appearance suggests potential enteritis (as described above), with mechanical obstruction less likely but not entirely excluded. Surgical consultation is recommended. 2. Hepatic steatosis. 3. Aortic atherosclerosis. 4. Additional incidental findings, as above. Electronically Signed   By: Vinnie Langton M.D.   On: 12/05/2022 06:27   DG Chest Port 1 View  Result Date: 12/05/2022 CLINICAL DATA:  72 year old male with shortness of breath and vomiting. EXAM: PORTABLE CHEST 1 VIEW COMPARISON:  CT Chest, Abdomen, and Pelvis today are reported separately. 08/06/2017 and earlier. FINDINGS: Portable AP semi upright view at 0502 hours. Lordotic positioning today. Lung volumes and mediastinal contours do not appear significantly changed from 2018, with mild cardiomegaly at that time. Some chronic lung base scarring. No pneumothorax, pulmonary edema or definite acute pulmonary opacity. Visualized tracheal air column is within normal limits. No acute osseous abnormality identified. Negative visible bowel gas. IMPRESSION: Cardiomegaly and lung base scarring. No acute cardiopulmonary abnormality. Electronically Signed   By: Genevie Ann M.D.   On: 12/05/2022 05:23    Procedures Procedures  Cardiac monitor shows sinus tachycardia, per my interpretation.  Medications Ordered in ED Medications  iohexol (OMNIPAQUE) 350 MG/ML injection 75 mL (75 mLs Intravenous Contrast Given 12/05/22 0609)    ED Course/ Medical Decision Making/ A&P                             Medical Decision Making Amount and/or Complexity of Data Reviewed Labs: ordered. Radiology: ordered.  Risk Prescription drug management. Decision regarding  hospitalization.   Fever with hypoxia and probable wheezing which has resolved with treatment by EMS.  Considered pneumonia, influenza, RSV, COVID-19.  Abdominal pain which seems to be an exacerbation of chronic abdominal pain.  I have ordered CT of abdomen and pelvis to evaluate this.  I have ordered a chest x-ray.  I have ordered screening labs of CBC, comprehensive metabolic panel, lipase.  I have ordered respiratory pathogen panel.  I have tried turning his oxygen down, and he requires oxygen at 4 L/min to keep from developing hypoxia.  Chest x-ray shows no evidence of pneumonia.  CT of abdomen and pelvis shows small bowel obstruction which is possibly mechanical, possibly secondary to enteritis.  I have independently viewed all of these images, and agree with the radiologist's interpretation.  I have reviewed and interpreted his laboratory test, and my interpretation is normal CBC, mild hyponatremia which is not felt to be clinically significant.  I have consulted general surgery regarding his bowel obstruction and the general surgery PA states they will come to evaluate the patient.  I have discussed the case with Dr. Marcina Millard of family practice teaching service who agrees to admit the patient.  CRITICAL CARE Performed by: Delora Fuel Total critical care time: 40 minutes Critical care time was exclusive of separately billable procedures and treating other patients. Critical care was necessary to treat or prevent imminent or life-threatening deterioration. Critical care was time spent personally by me on the following activities: development of treatment plan with patient and/or surrogate as well as nursing, discussions with consultants, evaluation of patient's response to treatment, examination of patient, obtaining history from patient or surrogate, ordering and performing treatments and interventions, ordering and review of laboratory studies, ordering and review of radiographic studies, pulse  oximetry and re-evaluation of patient's condition.  Final Clinical Impression(s) / ED Diagnoses Final diagnoses:  Acute respiratory failure with hypoxia ([place])  Small bowel obstruction ([place])  Hyponatremia    Rx / DC Orders ED Discharge Orders     None         Delora Fuel, MD 23/00/97 0730

## 2022-12-05 NOTE — Assessment & Plan Note (Deleted)
Now on RA and lung exam benign. CXR appears stable compared to 2018 CXR. Lower c/f aspiration PNA at this time. Initial O2 resp distress on admission may be due to SBO discomfort or aspiration pneumonitis.  - Cont CTX and flagyl. Consider narrowing or discontinuing if remaining stable from respiratory standpoint

## 2022-12-05 NOTE — Assessment & Plan Note (Addendum)
Tolerated breakfast this AM. Denies abm pain, N/V, diarrhea. Having BM's. Abm benign on exam.  -  Gen surg consulted, appreciate recs - Advance diet as tolerated (now trialing Regular diet)

## 2022-12-05 NOTE — Progress Notes (Signed)
   12/05/22 1400  Spiritual Encounters  Type of Visit Initial  Care provided to: Patient;Family  Referral source Patient request  Reason for visit Routine spiritual support  Interventions  Spiritual Care Interventions Made Reflective listening;Compassionate presence  Intervention Outcomes  Outcomes Reduced isolation   Ch responded to spiritual consult. Pt's son and daughter-in-law was at bedside. Pt's son wanted to learn more about pt's diagnosis. Ch told pt's son to speak with attending physician. Ch. Provided emotional and spiritual care. No follow up needed at this time.

## 2022-12-05 NOTE — Progress Notes (Signed)
[facility] 1950-11-12  [id number].    Requesting MD: Roxanne Mins, MD  Chief Complaint/Reason for Consult: SBO  HPI:  Manuel Reyes is a 72 y/o M with a PMH HTN, HLD, GERD, seizure-like activity, tuberculosis, dementia and polycythemia who presents complaining of shortness of breath and vomiting. Reports that yesterday, 2/5, he developed fever, nausea, vomiting, and diarrhea. Describes abdominal pain as cramping pain worst in lower abdomen but present all over. This pain is new but he also has chronic abdominal discomfort and has been referred to GI for this. He denies similar symptoms in the past or sick contacts. Denies melena, hematochezia, or hematemesis. Denies a history of abdominal surgery. Denies use of blood thinners. He states he has not had bowel function today - no BM and not passing flatus. Abdominal pain is improved since NGT placement.  Workup included CT abdomen and pelvis which suggested possible SBO and surgery was asked to consult.  Son is bedside  ROS: ROS reviewed and negative except as above  Family History  Problem Relation Age of Onset   Seizures Neg Hx    Dementia Neg Hx    Colon cancer Neg Hx    Esophageal cancer Neg Hx    Rectal cancer Neg Hx    Stomach cancer Neg Hx    Colon polyps Neg Hx     Past Medical History:  Diagnosis Date   Abnormal weight loss 10/11/2018   Arm pain 03/01/2020   Depression    Diabetes ([place])    no medications taken   Flank pain 07/28/2016   GERD (gastroesophageal reflux disease)    High cholesterol    Hypertension    Neuromuscular disorder ([place])    Polycythemia 03/01/2020   Seizures ([facility])    last one 2 years ago   Stomach ulcer    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 [place]- took entire  prescribed course of medication    Past Surgical History:  Procedure Laterality Date   AMPUTATION Left 04/16/2016   Procedure: AMPUTATION DIGIT;  Surgeon: Iran Planas, MD;  Location: [place];  Service: Orthopedics;  Laterality: Left;   NO PAST SURGERIES      Social History:  reports that he has never smoked. He has never used smokeless tobacco. He reports current alcohol use of about 1.0 standard drink of alcohol per week. He reports that he does not use drugs.  Allergies: No Known Allergies  Facility-Administered Medications Prior to Admission  Medication Dose Route Frequency Provider Last Rate Last Admin   0.9 %  sodium chloride infusion  500 mL Intravenous Once Armbruster, Carlota Raspberry, MD       Medications Prior to Admission  Medication Sig Dispense Refill   amLODipine (NORVASC) 10 MG tablet Take 1 tablet (10 mg total) by mouth daily. 90 tablet 1   cyclobenzaprine (FLEXERIL) 5 MG tablet Take 5 mg by mouth 3 (three) times daily as needed.     diclofenac (VOLTAREN) 75 MG EC tablet TAKE ONE TABLET BY MOUTH TWICE DAILY AS NEEDED 30 tablet 0   donepezil (ARICEPT) 10 MG tablet Take 1 tablet (10 mg total) by mouth at bedtime. 90 tablet 1   gabapentin (NEURONTIN) 100 MG capsule TAKE ONE CAPSULE BY MOUTH  THREE TIMES A Day 93 capsule 0   gabapentin (NEURONTIN) 600 MG tablet Take 0.5 tablets (300 mg total) by mouth at bedtime. 90 tablet 1   levETIRAcetam (KEPPRA) 500 MG tablet TAKE ONE TABLET BY MOUTH TWICE DAILY 62 tablet 11   lisinopril-hydrochlorothiazide (ZESTORETIC) 20-25 MG tablet Take 1 tablet by mouth daily. 90 tablet 0   meloxicam (MOBIC) 7.5 MG tablet Take 1 tablet (7.5 mg total) by mouth daily. 7 tablet 0   memantine (NAMENDA) 5 MG tablet TAKE ONE TABLET BY MOUTH TWICE DAILY 62 tablet 11   omeprazole (PRILOSEC) 20 MG capsule TAKE ONE CAPSULE BY MOUTH EVERY DAY 93 capsule 0   rosuvastatin (CRESTOR) 40 MG tablet Take 1 tablet (40 mg total) by mouth daily. 90 tablet 3   SENNA PLUS 8.6-50  MG tablet TAKE ONE TABLET BY MOUTH EVERY DAY 31 tablet 1   tamsulosin (FLOMAX) 0.4 MG CAPS capsule Take 1 capsule (0.4 mg total) by mouth daily. 90 capsule 0   venlafaxine XR (EFFEXOR-XR) 37.5 MG 24 hr capsule Take 1 by mouth with breakfast in the morning. 31 capsule 5     Physical Exam: Blood pressure (!) 139/105, pulse 92, temperature 97.7 F (36.5 C), resp. rate 16, height '5\' 4"'$  (1.626 m), weight 79.4 kg, SpO2 91 %. PE: General: pleasant, WD, male who is laying in bed in NAD HEENT: head is normocephalic, atraumatic.  Sclera are noninjected.  Pupils equal and round. EOMs intact.  Ears and nose without any masses or lesions.  Mouth is pink and moist Heart: regular, rate, and rhythm.  Normal s1,s2. No obvious murmurs, gallops, or rubs noted.  Palpable radial and pedal pulses bilaterally Lungs: CTAB, no wheezes, rhonchi, or rales noted.  Respiratory effort nonlabored on supplemental O2 via [place] Abd: soft, hypoactive BS, no masses, hernias, or organomegaly. Moderate distension. Diffuse mild TTP, greatest across lower abdomen. No rebound or guarding. NGT in place draining green fluid MSK: all 4 extremities are symmetrical with no cyanosis, clubbing, or edema. Skin: warm and dry with no masses, lesions, or rashes Neuro: Cranial nerves 2-12 grossly intact, sensation is normal throughout Psych: A&Ox3 with an appropriate affect.    Results for orders placed or performed during the hospital encounter of 12/05/22 (from the past 48 hour(s))  CBC with Differential     Status: Abnormal   Collection Time: 12/05/22  4:54 AM  Result Value Ref Range   WBC 5.9 4.0 - 10.5 K/uL   RBC 5.66 4.22 - 5.81 MIL/uL   Hemoglobin 18.2 (H) 13.0 - 17.0 g/dL   HCT 51.7 39.0 - 52.0 %   MCV 91.3 80.0 - 100.0 fL   MCH 32.2 26.0 - 34.0 pg   MCHC 35.2 30.0 - 36.0 g/dL   RDW 13.1 11.5 - 15.5 %   Platelets 131 (L) 150 - 400 K/uL    Comment: REPEATED TO VERIFY   nRBC 0.0 0.0 - 0.2 %   Neutrophils Relative % 69 %   Neutro  Abs 4.0 1.7 - 7.7 K/uL   Lymphocytes Relative 15 %   Lymphs Abs 0.9 0.7 - 4.0 K/uL   Monocytes Relative 15 %   Monocytes Absolute 0.9 0.1 - 1.0 K/uL   Eosinophils Relative 0 %   Eosinophils Absolute 0.0 0.0 - 0.5 K/uL   Basophils Relative 1 %   Basophils Absolute 0.0 0.0 - 0.1 K/uL   Immature Granulocytes 0 %   Abs Immature Granulocytes 0.02 0.00 - 0.07 K/uL    Comment:  Performed at [facility], [place] [address] [place], [place] [address]  Comprehensive metabolic panel     Status: Abnormal   Collection Time: 12/05/22  4:54 AM  Result Value Ref Range   Sodium 131 (L) 135 - 145 mmol/L   Potassium 4.0 3.5 - 5.1 mmol/L   Chloride 96 (L) 98 - 111 mmol/L   CO2 23 22 - 32 mmol/L   Glucose, Bld 216 (H) 70 - 99 mg/dL    Comment: Glucose reference range applies only to samples taken after fasting for at least 8 hours.   BUN 11 8 - 23 mg/dL   Creatinine, Ser 0.82 0.61 - 1.24 mg/dL   Calcium 8.7 (L) 8.9 - 10.3 mg/dL   Total Protein 7.1 6.5 - 8.1 g/dL   Albumin 3.6 3.5 - 5.0 g/dL   AST 34 15 - 41 U/L   ALT 24 0 - 44 U/L   Alkaline Phosphatase 76 38 - 126 U/L   Total Bilirubin 1.7 (H) 0.3 - 1.2 mg/dL   GFR, Estimated >60 >60 mL/min    Comment: (NOTE) Calculated using the CKD-EPI Creatinine Equation (2021)    Anion gap 12 5 - 15    Comment: Performed at [facility] [address] [place], [place] [address]  Lipase, blood     Status: None   Collection Time: 12/05/22  4:54 AM  Result Value Ref Range   Lipase 25 11 - 51 U/L    Comment: Performed at [facility] [address] [place], [place] [address]  Resp panel by RT-PCR (RSV, Flu A&B, Covid) Anterior Nasal Swab     Status: None   Collection Time: 12/05/22  5:00 AM   Specimen: Anterior Nasal Swab  Result Value Ref Range   SARS Coronavirus 2 by RT PCR NEGATIVE NEGATIVE   Influenza A by PCR NEGATIVE NEGATIVE   Influenza B by PCR NEGATIVE NEGATIVE    Comment: (NOTE) The Xpert Xpress SARS-CoV-2/FLU/RSV plus assay  is intended as an aid in the diagnosis of influenza from Nasopharyngeal swab specimens and should not be used as a sole basis for treatment. Nasal washings and aspirates are unacceptable for Xpert Xpress SARS-CoV-2/FLU/RSV testing.  Fact Sheet for Patients: EntrepreneurPulse.com.au  Fact Sheet for Healthcare Providers: IncredibleEmployment.be  This test is not yet approved or cleared by the [place] FDA and has been authorized for detection and/or diagnosis of SARS-CoV-2 by FDA under an Emergency Use Authorization (EUA). This EUA will remain in effect (meaning this test can be used) for the duration of the COVID-19 declaration under Section 564(b)(1) of the Act, 21 U.S.C. section 360bbb-3(b)(1), unless the authorization is terminated or revoked.     Resp Syncytial Virus by PCR NEGATIVE NEGATIVE    Comment: (NOTE) Fact Sheet for Patients: EntrepreneurPulse.com.au  Fact Sheet for Healthcare Providers: IncredibleEmployment.be  This test is not yet approved or cleared by the [place] FDA and has been authorized for detection and/or diagnosis of SARS-CoV-2 by FDA under an Emergency Use Authorization (EUA). This EUA will remain in effect (meaning this test can be used) for the duration of the COVID-19 declaration under Section 564(b)(1) of the Act, 21 U.S.C. section 360bbb-3(b)(1), unless the authorization is terminated or revoked.  Performed at [facility], [place] [address] [place], [place] [address]   Hemoglobin A1c     Status: Abnormal   Collection Time: 12/05/22  9:43 AM  Result Value Ref Range   Hgb A1c MFr Bld  5.8 (H) 4.8 - 5.6 %    Comment: (NOTE) Pre diabetes:          5.7%-6.4%  Diabetes:              >6.4%  Glycemic control for   <7.0% adults with diabetes    Mean Plasma Glucose 119.76 mg/dL    Comment: Performed at [facility], [place] [address], [place], [place]  [address]  I-STAT 7, (LYTES, BLD GAS, ICA, H+H)     Status: Abnormal   Collection Time: 12/05/22 10:37 AM  Result Value Ref Range   pH, Arterial 7.476 (H) 7.35 - 7.45   pCO2 arterial 35.3 32 - 48 mmHg   pO2, Arterial 101 83 - 108 mmHg   Bicarbonate 26.0 20.0 - 28.0 mmol/L   TCO2 27 22 - 32 mmol/L   O2 Saturation 98 %   Acid-Base Excess 3.0 (H) 0.0 - 2.0 mmol/L   Sodium 129 (L) 135 - 145 mmol/L   Potassium 3.6 3.5 - 5.1 mmol/L   Calcium, Ion 1.09 (L) 1.15 - 1.40 mmol/L   HCT 53.0 (H) 39.0 - 52.0 %   Hemoglobin 18.0 (H) 13.0 - 17.0 g/dL   Sample type ARTERIAL    DG Abd Portable 1 View  Result Date: 12/05/2022 CLINICAL DATA:  NG tube placement. EXAM: PORTABLE ABDOMEN - 1 VIEW COMPARISON:  CT 12/05/2022. FINDINGS: NG tube noted with tip projected over the lower stomach. Persistent dilated small bowel loops are noted. Visualized colonic gas pattern unremarkable. No free air noted. Calcific density noted over the right upper quadrant possibly undigested pill fragment. Bibasilar subsegmental atelectasis. IMPRESSION: 1. NG tube noted with tip projected over the lower stomach. Persistent dilated loops of small bowel noted. 2.  Bibasilar subsegmental atelectasis. Electronically Signed   By: Marcello Moores  Register M.D.   On: 12/05/2022 09:13   CT ABDOMEN PELVIS W CONTRAST  Result Date: 12/05/2022 CLINICAL DATA:  72 year old male with history of acute onset of nonlocalized abdominal pain, shortness of breath and chest pain for the past 2 days. Vomiting. EXAM: CT ABDOMEN AND PELVIS WITH CONTRAST TECHNIQUE: Multidetector CT imaging of the abdomen and pelvis was performed using the standard protocol following bolus administration of intravenous contrast. RADIATION DOSE REDUCTION: This exam was performed according to the departmental dose-optimization program which includes automated exposure control, adjustment of the mA and/or kV according to patient size and/or use of iterative reconstruction technique. CONTRAST:   58m OMNIPAQUE IOHEXOL 350 MG/ML SOLN COMPARISON:  CT of the abdomen and pelvis 10/18/2018. FINDINGS: Lower chest: Esophagus appears patulous and fluid-filled. Mild scarring in the lung bases bilaterally. Hepatobiliary: Diffuse low attenuation throughout the hepatic parenchyma, indicative of a background of hepatic steatosis. No suspicious cystic or solid hepatic lesions. No intra or extrahepatic biliary ductal dilatation. Gallbladder is unremarkable in appearance. Pancreas: No pancreatic mass. No pancreatic ductal dilatation. No pancreatic or peripancreatic fluid collections or inflammatory changes. Spleen: Unremarkable. Adrenals/Urinary Tract: Subcentimeter low-attenuation lesion in the interpolar region of the left kidney, too small to characterize, but statistically likely a cyst (no imaging follow-up recommended). Right kidney and bilateral adrenal glands are normal in appearance. No hydroureteronephrosis. Urinary bladder is unremarkable in appearance. Stomach/Bowel: Stomach is distended and fluid-filled with an air-fluid level. Proximal small bowel is distended (up to 4 cm in diameter) with multiple air-fluid levels and some fecalization of small bowel contents. Colon is nearly completely decompressed, as is the distal small bowel. There is a relatively gradual transition to this nondilated small  bowel, suggesting against frank mechanical obstruction. There are several regions of the mid small bowel which appear thickened with increased mucosal hyperenhancement and hypervascularity in the associated small bowel mesentery, best appreciated on coronal images 72-91 of series 6. Vascular/Lymphatic: Aortic atherosclerosis, without evidence of aneurysm or dissection in the abdominal or pelvic vasculature. No lymphadenopathy noted in the abdomen or pelvis. Reproductive: Prostate gland and seminal vesicles are unremarkable in appearance. Other: No significant volume of ascites.  No pneumoperitoneum. Musculoskeletal:  There are no aggressive appearing lytic or blastic lesions noted in the visualized portions of the skeleton. Chronic appearing T11 compression fracture with 25% loss of anterior vertebral body height. IMPRESSION: 1. Findings are compatible with small bowel obstruction, with gradual transition to nondilated small bowel in the region of the jejunum or proximal ileum, where there are segments of the small bowel which appears thickened and inflamed. The overall appearance suggests potential enteritis (as described above), with mechanical obstruction less likely but not entirely excluded. Surgical consultation is recommended. 2. Hepatic steatosis. 3. Aortic atherosclerosis. 4. Additional incidental findings, as above. Electronically Signed   By: Vinnie Langton M.D.   On: 12/05/2022 06:27   DG Chest Port 1 View  Result Date: 12/05/2022 CLINICAL DATA:  72 year old male with shortness of breath and vomiting. EXAM: PORTABLE CHEST 1 VIEW COMPARISON:  CT Chest, Abdomen, and Pelvis today are reported separately. 08/06/2017 and earlier. FINDINGS: Portable AP semi upright view at 0502 hours. Lordotic positioning today. Lung volumes and mediastinal contours do not appear significantly changed from 2018, with mild cardiomegaly at that time. Some chronic lung base scarring. No pneumothorax, pulmonary edema or definite acute pulmonary opacity. Visualized tracheal air column is within normal limits. No acute osseous abnormality identified. Negative visible bowel gas. IMPRESSION: Cardiomegaly and lung base scarring. No acute cardiopulmonary abnormality. Electronically Signed   By: Genevie Ann M.D.   On: 12/05/2022 05:23    Anti-infectives (From admission, onward)    Start     Dose/Rate Route Frequency Ordered Stop   12/05/22 0815  cefTRIAXone (ROCEPHIN) 1 g in sodium chloride 0.9 % 100 mL IVPB        1 g 200 mL/hr over 30 Minutes Intravenous Every 24 hours 12/05/22 0800     12/05/22 0815  metroNIDAZOLE (FLAGYL) IVPB 500 mg         500 mg 100 mL/hr over 60 Minutes Intravenous Every 8 hours 12/05/22 0800          Assessment/Plan pSBO vs Enteritis - Patient presents with nausea, vomiting, and diarrhea. He has no h/o prior abdominal surgery. - CT scan questions possible SBO with gradual transition to nondilated small bowel in the region of the jejunum or proximal ileum where there are segments of thickened small bowel that appear more consistent with enteritis  - No indication for acute surgical intervention. NG tube has been placed. Suspect this is more likely an enteritis, but will start patient on small bowel obstruction protocol with gastrograffin and delayed film.   FEN - NPO, NG to LIWS, SBO protocol  VTE - SCDs, lovenox ID - [email or web address]/flagyl   I reviewed hospitalist notes, last 24 h vitals and pain scores, last 48 h intake and output, last 24 h labs and trends, and last 24 h imaging results.  Due to language barrier, an interpreter was present during the history-taking and subsequent discussion (and for part of the physical exam) with this patient.  Winferd Humphrey, [facility] 12/05/2022, 1:37 PM Please see  Amion for pager number during day hours 7:00am-4:30pm

## 2022-12-05 NOTE — Assessment & Plan Note (Addendum)
-   Restarted home amlodipine, lisinopril, HCTZ

## 2022-12-05 NOTE — ED Triage Notes (Addendum)
Pt BIBA to ED for SOB and CP x2 days. SpO2 86% RA upon EMS arrival.Pt vomited x1 with EMS. Nepali speaking    Given PTA:  2g mag  '125mg'$  solumedrol  2 duonebs  '4mg'$  zofran   40rr 162/90 107

## 2022-12-06 ENCOUNTER — Inpatient Hospital Stay (HOSPITAL_COMMUNITY): Payer: Medicaid Other

## 2022-12-06 DIAGNOSIS — R0609 Other forms of dyspnea: Secondary | ICD-10-CM

## 2022-12-06 DIAGNOSIS — K56609 Unspecified intestinal obstruction, unspecified as to partial versus complete obstruction: Secondary | ICD-10-CM | POA: Diagnosis not present

## 2022-12-06 LAB — ECHOCARDIOGRAM COMPLETE
AR max vel: 2.79 cm2
AV Area VTI: 3.11 cm2
AV Area mean vel: 2.68 cm2
AV Mean grad: 4 mmHg
AV Peak grad: 7.7 mmHg
Ao pk vel: 1.39 m/s
Area-P 1/2: 3.93 cm2
Calc EF: 62.7 %
Height: 64 in
MV M vel: 1.91 m/s
MV Peak grad: 14.6 mmHg
S' Lateral: 2.5 cm
Single Plane A2C EF: 59.8 %
Single Plane A4C EF: 64.4 %
Weight: 2800 oz

## 2022-12-06 LAB — GLUCOSE, CAPILLARY
Glucose-Capillary: 111 mg/dL — ABNORMAL HIGH (ref 70–99)
Glucose-Capillary: 119 mg/dL — ABNORMAL HIGH (ref 70–99)
Glucose-Capillary: 135 mg/dL — ABNORMAL HIGH (ref 70–99)
Glucose-Capillary: 136 mg/dL — ABNORMAL HIGH (ref 70–99)
Glucose-Capillary: 82 mg/dL (ref 70–99)
Glucose-Capillary: 84 mg/dL (ref 70–99)
Glucose-Capillary: 99 mg/dL (ref 70–99)

## 2022-12-06 LAB — CBC
HCT: 50.9 % (ref 39.0–52.0)
Hemoglobin: 17.8 g/dL — ABNORMAL HIGH (ref 13.0–17.0)
MCH: 32 pg (ref 26.0–34.0)
MCHC: 35 g/dL (ref 30.0–36.0)
MCV: 91.5 fL (ref 80.0–100.0)
Platelets: 143 10*3/uL — ABNORMAL LOW (ref 150–400)
RBC: 5.56 MIL/uL (ref 4.22–5.81)
RDW: 13.3 % (ref 11.5–15.5)
WBC: 5.4 10*3/uL (ref 4.0–10.5)
nRBC: 0 % (ref 0.0–0.2)

## 2022-12-06 LAB — COMPREHENSIVE METABOLIC PANEL
ALT: 19 U/L (ref 0–44)
AST: 24 U/L (ref 15–41)
Albumin: 3 g/dL — ABNORMAL LOW (ref 3.5–5.0)
Alkaline Phosphatase: 52 U/L (ref 38–126)
Anion gap: 11 (ref 5–15)
BUN: 17 mg/dL (ref 8–23)
CO2: 22 mmol/L (ref 22–32)
Calcium: 8.2 mg/dL — ABNORMAL LOW (ref 8.9–10.3)
Chloride: 104 mmol/L (ref 98–111)
Creatinine, Ser: 0.95 mg/dL (ref 0.61–1.24)
GFR, Estimated: >60 mL/min (ref >60)
Glucose, Bld: 119 mg/dL — ABNORMAL HIGH (ref 70–99)
Potassium: 4.1 mmol/L (ref 3.5–5.1)
Sodium: 137 mmol/L (ref 135–145)
Total Bilirubin: 0.8 mg/dL (ref 0.3–1.2)
Total Protein: 6.6 g/dL (ref 6.5–8.1)

## 2022-12-06 MED ORDER — ONDANSETRON HCL 4 MG/2ML IJ SOLN
4.0000 mg | Freq: Four times a day (QID) | INTRAMUSCULAR | Status: DC | PRN
  Administered 2022-12-06: 4 mg via INTRAVENOUS
  Filled 2022-12-06: qty 2

## 2022-12-06 MED ORDER — PANTOPRAZOLE SODIUM 40 MG IV SOLR
40.0000 mg | Freq: Once | INTRAVENOUS | Status: AC
  Administered 2022-12-06: 40 mg via INTRAVENOUS
  Filled 2022-12-06: qty 10

## 2022-12-06 MED ORDER — ACETAMINOPHEN 10 MG/ML IV SOLN: 1000.0000 mg | Freq: Four times a day (QID) | INTRAVENOUS | Status: DC | PRN

## 2022-12-06 NOTE — Progress Notes (Signed)
[facility] Progress Note     Subjective: CC-  Son at bedside who helped with translating. Patient was coughing this morning just before 8am and NG came out. Denies worsening abdominal pain, bloating, nausea, or vomiting since then. He is passing some flatus and had 2 loose stools this morning.  Objective: Vital signs in last 24 hours: Temp:  [97.7 F (36.5 C)-98.2 F (36.8 C)] 98.2 F (36.8 C) (02/07 0918) Pulse Rate:  [64-93] 64 (02/07 0918) Resp:  [16-18] 18 (02/07 0918) BP: (125-140)/(81-106) 140/94 (02/07 0918) SpO2:  [91 %-96 %] 95 % (02/07 0918)    Intake/Output from previous day: 02/06 0701 - 02/07 0700 In: 1664.5 [I.V.:709.1; IV Piggyback:955.3] Out: 2900 [Urine:500; Emesis/NG output:2400] Intake/Output this shift: Total I/O In: -  Out: 1 [Emesis/NG output:1]  PE: Gen:  Alert, NAD Abd: soft, mild distension, bowel sounds present, nontender  Lab Results:  Recent Labs    12/05/22 0454 12/05/22 1037 12/06/22 0507  WBC 5.9  --  5.4  HGB 18.2* 18.0* 17.8*  HCT 51.7 53.0* 50.9  PLT 131*  --  143*   BMET Recent Labs    12/05/22 0454 12/05/22 1037 12/06/22 0507  NA 131* 129* 137  K 4.0 3.6 4.1  CL 96*  --  104  CO2 23  --  22  GLUCOSE 216*  --  119*  BUN 11  --  17  CREATININE 0.82  --  0.95  CALCIUM 8.7*  --  8.2*   PT/INR No results for input(s): "LABPROT", "INR" in the last 72 hours. CMP     Component Value Date/Time   NA 137 12/06/2022 0507   NA 137 11/02/2022 1528   K 4.1 12/06/2022 0507   CL 104 12/06/2022 0507   CO2 22 12/06/2022 0507   GLUCOSE 119 (H) 12/06/2022 0507   BUN 17 12/06/2022 0507   BUN 11 11/02/2022 1528   CREATININE 0.95 12/06/2022 0507   CREATININE 0.69 (L) 11/26/2015 1334   CALCIUM 8.2 (L) 12/06/2022 0507   PROT 6.6 12/06/2022 0507   PROT 7.5 11/02/2022 1528   ALBUMIN 3.0 (L) 12/06/2022 0507   ALBUMIN 4.5 11/02/2022 1528   AST 24 12/06/2022 0507   ALT 19 12/06/2022 0507   ALKPHOS 52 12/06/2022 0507    BILITOT 0.8 12/06/2022 0507   BILITOT 0.6 11/02/2022 1528   GFRNONAA >60 12/06/2022 0507   GFRNONAA >89 11/26/2015 1334   GFRAA 104 08/08/2019 1341   GFRAA >89 11/26/2015 1334   Lipase     Component Value Date/Time   LIPASE 25 12/05/2022 0454       Studies/Results: DG Abd Portable 1V-Small Bowel Obstruction Protocol-initial, 8 hr delay  Result Date: 12/05/2022 CLINICAL DATA:  Small bowel obstruction EXAM: PORTABLE ABDOMEN - 1 VIEW COMPARISON:  12/05/2022, CT 12/05/2022 FINDINGS: Esophageal tube tip overlies the mid stomach. Contrast in the urinary bladder. Dilated small bowel measuring up to 5.3 cm with some distal gas noted. IMPRESSION: 1. Esophageal tube tip overlies the mid stomach. 2. Dilated small bowel with some distal gas. Findings consistent with bowel obstruction, this does not appear significantly changed Electronically Signed   By: Donavan Foil M.D.   On: 12/05/2022 21:55   DG Abd Portable 1 View  Result Date: 12/05/2022 CLINICAL DATA:  NG tube placement. EXAM: PORTABLE ABDOMEN - 1 VIEW COMPARISON:  CT 12/05/2022. FINDINGS: NG tube noted with tip projected over the lower stomach. Persistent dilated small bowel loops are noted. Visualized colonic gas pattern unremarkable. No  free air noted. Calcific density noted over the right upper quadrant possibly undigested pill fragment. Bibasilar subsegmental atelectasis. IMPRESSION: 1. NG tube noted with tip projected over the lower stomach. Persistent dilated loops of small bowel noted. 2.  Bibasilar subsegmental atelectasis. Electronically Signed   By: Marcello Moores  Register M.D.   On: 12/05/2022 09:13   CT ABDOMEN PELVIS W CONTRAST  Result Date: 12/05/2022 CLINICAL DATA:  72 year old male with history of acute onset of nonlocalized abdominal pain, shortness of breath and chest pain for the past 2 days. Vomiting. EXAM: CT ABDOMEN AND PELVIS WITH CONTRAST TECHNIQUE: Multidetector CT imaging of the abdomen and pelvis was performed using the  standard protocol following bolus administration of intravenous contrast. RADIATION DOSE REDUCTION: This exam was performed according to the departmental dose-optimization program which includes automated exposure control, adjustment of the mA and/or kV according to patient size and/or use of iterative reconstruction technique. CONTRAST:  70m OMNIPAQUE IOHEXOL 350 MG/ML SOLN COMPARISON:  CT of the abdomen and pelvis 10/18/2018. FINDINGS: Lower chest: Esophagus appears patulous and fluid-filled. Mild scarring in the lung bases bilaterally. Hepatobiliary: Diffuse low attenuation throughout the hepatic parenchyma, indicative of a background of hepatic steatosis. No suspicious cystic or solid hepatic lesions. No intra or extrahepatic biliary ductal dilatation. Gallbladder is unremarkable in appearance. Pancreas: No pancreatic mass. No pancreatic ductal dilatation. No pancreatic or peripancreatic fluid collections or inflammatory changes. Spleen: Unremarkable. Adrenals/Urinary Tract: Subcentimeter low-attenuation lesion in the interpolar region of the left kidney, too small to characterize, but statistically likely a cyst (no imaging follow-up recommended). Right kidney and bilateral adrenal glands are normal in appearance. No hydroureteronephrosis. Urinary bladder is unremarkable in appearance. Stomach/Bowel: Stomach is distended and fluid-filled with an air-fluid level. Proximal small bowel is distended (up to 4 cm in diameter) with multiple air-fluid levels and some fecalization of small bowel contents. Colon is nearly completely decompressed, as is the distal small bowel. There is a relatively gradual transition to this nondilated small bowel, suggesting against frank mechanical obstruction. There are several regions of the mid small bowel which appear thickened with increased mucosal hyperenhancement and hypervascularity in the associated small bowel mesentery, best appreciated on coronal images 72-91 of series 6.  Vascular/Lymphatic: Aortic atherosclerosis, without evidence of aneurysm or dissection in the abdominal or pelvic vasculature. No lymphadenopathy noted in the abdomen or pelvis. Reproductive: Prostate gland and seminal vesicles are unremarkable in appearance. Other: No significant volume of ascites.  No pneumoperitoneum. Musculoskeletal: There are no aggressive appearing lytic or blastic lesions noted in the visualized portions of the skeleton. Chronic appearing T11 compression fracture with 25% loss of anterior vertebral body height. IMPRESSION: 1. Findings are compatible with small bowel obstruction, with gradual transition to nondilated small bowel in the region of the jejunum or proximal ileum, where there are segments of the small bowel which appears thickened and inflamed. The overall appearance suggests potential enteritis (as described above), with mechanical obstruction less likely but not entirely excluded. Surgical consultation is recommended. 2. Hepatic steatosis. 3. Aortic atherosclerosis. 4. Additional incidental findings, as above. Electronically Signed   By: DVinnie LangtonM.D.   On: 12/05/2022 06:27   DG Chest Port 1 View  Result Date: 12/05/2022 CLINICAL DATA:  72year old male with shortness of breath and vomiting. EXAM: PORTABLE CHEST 1 VIEW COMPARISON:  CT Chest, Abdomen, and Pelvis today are reported separately. 08/06/2017 and earlier. FINDINGS: Portable AP semi upright view at 0502 hours. Lordotic positioning today. Lung volumes and mediastinal contours do not appear significantly changed  from 2018, with mild cardiomegaly at that time. Some chronic lung base scarring. No pneumothorax, pulmonary edema or definite acute pulmonary opacity. Visualized tracheal air column is within normal limits. No acute osseous abnormality identified. Negative visible bowel gas. IMPRESSION: Cardiomegaly and lung base scarring. No acute cardiopulmonary abnormality. Electronically Signed   By: Genevie Ann M.D.    On: 12/05/2022 05:23    Anti-infectives: Anti-infectives (From admission, onward)    Start     Dose/Rate Route Frequency Ordered Stop   12/05/22 0815  cefTRIAXone (ROCEPHIN) 1 g in sodium chloride 0.9 % 100 mL IVPB        1 g 200 mL/hr over 30 Minutes Intravenous Every 24 hours 12/05/22 0800     12/05/22 0815  metroNIDAZOLE (FLAGYL) IVPB 500 mg        500 mg 100 mL/hr over 60 Minutes Intravenous Every 8 hours 12/05/22 0800          Assessment/Plan pSBO vs Enteritis - Patient presents with nausea, vomiting, and diarrhea. He has no h/o prior abdominal surgery. - CT scan 2/6 questions possible SBO with gradual transition to nondilated small bowel in the region of the jejunum or proximal ileum where there are segments of thickened small bowel that appear more consistent with enteritis  - I do not see the gastrograffin on his film from last night but did show ongoing small bowel distension up to 5.3cm. Unfortunately his NG came out earlier this morning but he is also having some bowel function and denies bloating, n/v. Will repeat film this morning. Pending that and any recurrent bloating, nausea, or vomiting he may need NG tube replaced.  FEN - IVF, NPO VTE - SCDs, lovenox ID - rocephin/flagyl    I reviewed last 24 h vitals and pain scores, last 48 h intake and output, last 24 h labs and trends, and last 24 h imaging results.    LOS: 1 day    Wellington Hampshire, [facility] 12/06/2022, 9:44 AM Please see Amion for pager number during day hours 7:00am-4:30pm

## 2022-12-06 NOTE — Progress Notes (Addendum)
Daily Progress Note Intern Pager: [phone]  Patient name: Manuel Reyes Medical record number: [id number] Date of birth: Jul 13, 1951 Age: 72 y.o. Gender: male  Primary Care Provider: Eppie Gibson, MD Consultants: General surgery Code Status: Full  Pt Overview and Major Events to Date:  2/6 Admitted  Assessment and Plan: BS is a 72yo M admitted for SBO.  PMH pertinent for seizures, polycythemia, recent dark stools, HTN, DM2, dementia.   * Small bowel obstruction (HCC) Had loose stool this AM. Also emesis this AM, accidentally displaced NG tube. Abm diffusely tender, but subjectively improving. Overall, pt obstruction is stable/improving but will need further bowel decompression. - Continue NPO. FUB KUB and replace NG tube as appropriate. - Monitor for BM and emesis - mIVF - Start Protonix 40 IV. Pt reports reflux discomfort.  - Gen surg consulted, appreciate recs - Tylenol prn for pain and fever  SIRS (systemic inflammatory response syndrome) (HCC) Remains afebrile ON and no leukocytosis, no anion gap. Bcx NGTD. He is also now on RA. Lower concern for true sepsis at this time. Suspect initial presentation is more likely 2/2 SBO and enteritis, less likely from PNA (discussed below). - cont gentle IVF - Cont CTX and flagyl. Narrow as appropriate.  Acute hypoxic respiratory failure ([place]) Now on RA and lung exam benign. CXR appears stable compared to 2018 CXR. Lower c/f aspiration PNA at this time. Initial O2 resp distress on admission may be due to SBO discomfort or aspiration pneumonitis.  - Cont CTX and flagyl. Consider narrowing or discontinuing if remaining stable from respiratory standpoint  Polycythemia Blood smear confirms chronic polycythemia, no acute changes.  - needs outpt sleep study - AM CBC  Seizure-like activity (HCC) - Cont Keppra 500 IV BID (can transition back to oral when [place]) - Seizure precautions  Essential hypertension BP uptrending. -  Restart home amlodipine, lisinopril, HCTZ once able to p.o. - consider prn Hydral if BP >200/110   Chronic diastolic heart failure (HCC) Trace pitting edema on exam, but otherwise euvolemic (on RA).  - decrease IVF to 158m/hr while NPO - f/u echo  History of diabetes mellitus A1c 5.8, @ goal.  - Cont SSI.   FEN/GI: NPO PPx: Lovenox Dispo:Pending PT recommendations  pending clinical improvement . Barriers include ongoing SBO needing NG tube and IVF.   Subjective:  Feels like his abm pain and nausea are improving. Reports having 1 diarrhea and 1 emesis episode this morning. NG tube accidentally fell out.   Objective: Temp:  [97.7 F (36.5 C)-98.2 F (36.8 C)] 98.2 F (36.8 C) (02/07 0918) Pulse Rate:  [64-93] 64 (02/07 0918) Resp:  [16-18] 18 (02/07 0918) BP: (125-140)/(81-106) 140/94 (02/07 0918) SpO2:  [94 %-96 %] 95 % (02/07 0918) Physical Exam: General: Alert, sitting up in bed. NAD. Cardiovascular: RRR, no murmurs. Respiratory: CTAB. Normal WOB on RA. Abdomen: Diffusely tender to light palpation. Hyperactive BS. Extremities: Trace pitting edema BL LE.  Laboratory: Most recent CBC Lab Results  Component Value Date   WBC 5.4 12/06/2022   HGB 17.8 (H) 12/06/2022   HCT 50.9 12/06/2022   MCV 91.5 12/06/2022   PLT 143 (L) 12/06/2022   Most recent BMP    Latest Ref Rng & Units 12/06/2022    5:07 AM  BMP  Glucose 70 - 99 mg/dL 119   BUN 8 - 23 mg/dL 17   Creatinine 0.61 - 1.24 mg/dL 0.95   Sodium 135 - 145 mmol/L 137   Potassium 3.5 -  5.1 mmol/L 4.1   Chloride 98 - 111 mmol/L 104   CO2 22 - 32 mmol/L 22   Calcium 8.9 - 10.3 mg/dL 8.2     Arlyce Dice, MD 12/06/2022, 2:59 PM  PGY-1, [place] Intern pager: [phone], text pages welcome Secure chat group [place]

## 2022-12-06 NOTE — Progress Notes (Signed)
  Echocardiogram 2D Echocardiogram has been performed.  Lana Fish 12/06/2022, 12:04 PM

## 2022-12-06 NOTE — Progress Notes (Addendum)
FMTS Brief Progress Note Paged by RN about patient having nausea. Saw patient and he is endorsing some generalized pain throughout abdominal (5 - 6 / 10 pain) and chest area as well as feeling nauseous. Ordered ondansetron 4 mg IV PRN for nausea

## 2022-12-07 ENCOUNTER — Other Ambulatory Visit: Payer: Self-pay | Admitting: Student

## 2022-12-07 DIAGNOSIS — F411 Generalized anxiety disorder: Secondary | ICD-10-CM

## 2022-12-07 DIAGNOSIS — K2100 Gastro-esophageal reflux disease with esophagitis, without bleeding: Secondary | ICD-10-CM

## 2022-12-07 DIAGNOSIS — K56609 Unspecified intestinal obstruction, unspecified as to partial versus complete obstruction: Secondary | ICD-10-CM | POA: Diagnosis not present

## 2022-12-07 LAB — CBC
HCT: 46.9 % (ref 39.0–52.0)
Hemoglobin: 15.9 g/dL (ref 13.0–17.0)
MCH: 31.7 pg (ref 26.0–34.0)
MCHC: 33.9 g/dL (ref 30.0–36.0)
MCV: 93.6 fL (ref 80.0–100.0)
Platelets: 130 10*3/uL — ABNORMAL LOW (ref 150–400)
RBC: 5.01 MIL/uL (ref 4.22–5.81)
RDW: 13.7 % (ref 11.5–15.5)
WBC: 4.9 10*3/uL (ref 4.0–10.5)
nRBC: 0 % (ref 0.0–0.2)

## 2022-12-07 LAB — BLOOD CULTURE ID PANEL (REFLEXED) - BCID2

## 2022-12-07 LAB — BASIC METABOLIC PANEL
Anion gap: 11 (ref 5–15)
BUN: 18 mg/dL (ref 8–23)
CO2: 20 mmol/L — ABNORMAL LOW (ref 22–32)
Calcium: 7.6 mg/dL — ABNORMAL LOW (ref 8.9–10.3)
Chloride: 107 mmol/L (ref 98–111)
Creatinine, Ser: 0.8 mg/dL (ref 0.61–1.24)
GFR, Estimated: >60 mL/min (ref >60)
Glucose, Bld: 73 mg/dL (ref 70–99)
Potassium: 3.9 mmol/L (ref 3.5–5.1)
Sodium: 138 mmol/L (ref 135–145)

## 2022-12-07 LAB — GLUCOSE, CAPILLARY
Glucose-Capillary: 116 mg/dL — ABNORMAL HIGH (ref 70–99)
Glucose-Capillary: 118 mg/dL — ABNORMAL HIGH (ref 70–99)
Glucose-Capillary: 130 mg/dL — ABNORMAL HIGH (ref 70–99)
Glucose-Capillary: 74 mg/dL (ref 70–99)
Glucose-Capillary: 80 mg/dL (ref 70–99)
Glucose-Capillary: 82 mg/dL (ref 70–99)

## 2022-12-07 MED ORDER — DEXTROSE-NACL 5-0.9 % IV SOLN: INTRAVENOUS | Status: DC

## 2022-12-07 MED ORDER — METRONIDAZOLE 500 MG/100ML IV SOLN
500.0000 mg | Freq: Two times a day (BID) | INTRAVENOUS | Status: AC
  Administered 2022-12-07 (×2): 500 mg via INTRAVENOUS
  Filled 2022-12-07 (×2): qty 100

## 2022-12-07 MED ORDER — OLOPATADINE HCL 0.1 % OP SOLN: 1.0000 [drp] | Freq: Two times a day (BID) | OPHTHALMIC | Status: DC | PRN

## 2022-12-07 NOTE — Progress Notes (Signed)
Notified by nursing that pt had acute burning and redness of L eye. Saw pt, son at bedside. Reports that his L eye feels like "burning" and itching, watering. Drainage is clear. Denies eye pain, vision changes, HA, purulent discharge, hx of allergies.   Exam: L eye conjuncitivits. Nontender to palpation around L eye. No purulence. Vision intact BL.    Plan: - Add prn olapatadine for allergic conjunctivitis

## 2022-12-07 NOTE — Progress Notes (Signed)
[facility] Progress Note     Subjective: CC-  Patient vomited last night and NG was replaced. He has had about 450cc bilious output. Denies any abdominal pain this morning. Says he is passing gas and BM x3 recorded for yesterday. Stool is loose.  Objective: Vital signs in last 24 hours: Temp:  [97.6 F (36.4 C)-98.4 F (36.9 C)] 98.4 F (36.9 C) (02/08 0739) Pulse Rate:  [62-70] 64 (02/08 0739) Resp:  [15-18] 16 (02/08 0739) BP: (137-147)/(86-92) 143/86 (02/08 0739) SpO2:  [93 %-96 %] 93 % (02/08 0739) Last BM Date : 12/06/22  Intake/Output from previous day: 02/07 0701 - 02/08 0700 In: 2266.5 [I.V.:1966.5; IV Piggyback:300] Out: 1 [Emesis/NG output:1] Intake/Output this shift: No intake/output data recorded.  PE: Gen:  Alert, NAD HEENT: NG with bilious fluid in cannister  Abd: soft, minimal distension, bowel sounds present, nontender  Lab Results:  Recent Labs    12/06/22 0507 12/07/22 0334  WBC 5.4 4.9  HGB 17.8* 15.9  HCT 50.9 46.9  PLT 143* 130*   BMET Recent Labs    12/06/22 0507 12/07/22 0334  NA 137 138  K 4.1 3.9  CL 104 107  CO2 22 20*  GLUCOSE 119* 73  BUN 17 18  CREATININE 0.95 0.80  CALCIUM 8.2* 7.6*   PT/INR No results for input(s): "LABPROT", "INR" in the last 72 hours. CMP     Component Value Date/Time   NA 138 12/07/2022 0334   NA 137 11/02/2022 1528   K 3.9 12/07/2022 0334   CL 107 12/07/2022 0334   CO2 20 (L) 12/07/2022 0334   GLUCOSE 73 12/07/2022 0334   BUN 18 12/07/2022 0334   BUN 11 11/02/2022 1528   CREATININE 0.80 12/07/2022 0334   CREATININE 0.69 (L) 11/26/2015 1334   CALCIUM 7.6 (L) 12/07/2022 0334   PROT 6.6 12/06/2022 0507   PROT 7.5 11/02/2022 1528   ALBUMIN 3.0 (L) 12/06/2022 0507   ALBUMIN 4.5 11/02/2022 1528   AST 24 12/06/2022 0507   ALT 19 12/06/2022 0507   ALKPHOS 52 12/06/2022 0507   BILITOT 0.8 12/06/2022 0507   BILITOT 0.6 11/02/2022 1528   GFRNONAA >60 12/07/2022 0334   GFRNONAA >89  11/26/2015 1334   GFRAA 104 08/08/2019 1341   GFRAA >89 11/26/2015 1334   Lipase     Component Value Date/Time   LIPASE 25 12/05/2022 0454       Studies/Results: ECHOCARDIOGRAM COMPLETE  Result Date: 12/06/2022    ECHOCARDIOGRAM REPORT   Patient Name:   Manuel Reyes Date of Exam: 12/06/2022 Medical Rec #:  [id number]        Height:       64.0 in Accession #:    [id number]       Weight:       175.0 lb Date of Birth:  [birth year]         BSA:          1.848 m Patient Age:    72 years         BP:           131/106 mmHg Patient Gender: M                HR:           78 bpm. Exam Location:  Inpatient Procedure: 2D Echo Indications:    dyspnea  History:        Patient has prior history of Echocardiogram examinations, most  recent 06/16/2015. Risk Factors:Hypertension, Diabetes and                 Dyslipidemia.  Sonographer:    Harvie Junior Referring Phys: [phone] Delorse Limber MCINTYRE  Sonographer Comments: Technically difficult study due to poor echo windows and suboptimal subcostal window. IMPRESSIONS  1. Left ventricular ejection fraction, by estimation, is 65 to 70%. The left ventricle has normal function. The left ventricle has no regional wall motion abnormalities. Left ventricular diastolic parameters were normal.  2. Right ventricular systolic function is normal. The right ventricular size is normal. Tricuspid regurgitation signal is inadequate for assessing PA pressure.  3. The mitral valve is normal in structure. No evidence of mitral valve regurgitation. No evidence of mitral stenosis.  4. The aortic valve is tricuspid. Aortic valve regurgitation is not visualized. No aortic stenosis is present.  5. Aortic dilatation noted. There is mild dilatation of the ascending aorta, measuring 38 mm.  6. The inferior vena cava is normal in size with greater than 50% respiratory variability, suggesting right atrial pressure of 3 mmHg. FINDINGS  Left Ventricle: Left ventricular ejection fraction, by  estimation, is 65 to 70%. The left ventricle has normal function. The left ventricle has no regional wall motion abnormalities. The left ventricular internal cavity size was normal in size. There is  no left ventricular hypertrophy. Left ventricular diastolic parameters were normal. Right Ventricle: The right ventricular size is normal. No increase in right ventricular wall thickness. Right ventricular systolic function is normal. Tricuspid regurgitation signal is inadequate for assessing PA pressure. Left Atrium: Left atrial size was normal in size. Right Atrium: Right atrial size was normal in size. Pericardium: Trivial pericardial effusion is present. Presence of epicardial fat layer. Mitral Valve: The mitral valve is normal in structure. No evidence of mitral valve regurgitation. No evidence of mitral valve stenosis. Tricuspid Valve: The tricuspid valve is normal in structure. Tricuspid valve regurgitation is trivial. Aortic Valve: The aortic valve is tricuspid. Aortic valve regurgitation is not visualized. No aortic stenosis is present. Aortic valve mean gradient measures 4.0 mmHg. Aortic valve peak gradient measures 7.7 mmHg. Aortic valve area, by VTI measures 3.11 cm. Pulmonic Valve: The pulmonic valve was not well visualized. Pulmonic valve regurgitation is not visualized. Aorta: The aortic root is normal in size and structure and aortic dilatation noted. There is mild dilatation of the ascending aorta, measuring 38 mm. Venous: The inferior vena cava is normal in size with greater than 50% respiratory variability, suggesting right atrial pressure of 3 mmHg. IAS/Shunts: The interatrial septum was not well visualized.  LEFT VENTRICLE PLAX 2D LVIDd:         3.90 cm     Diastology LVIDs:         2.50 cm     LV e' medial:    6.09 cm/s LV PW:         0.90 cm     LV E/e' medial:  13.9 LV IVS:        0.90 cm     LV e' lateral:   11.20 cm/s LVOT diam:     2.10 cm     LV E/e' lateral: 7.6 LV SV:         94 LV SV  Index:   51 LVOT Area:     3.46 cm                             3D  Volume EF: LV Volumes (MOD)           3D EF:        73 % LV vol d, MOD A2C: 96.4 ml LV EDV:       112 ml LV vol d, MOD A4C: 94.1 ml LV ESV:       30 ml LV vol s, MOD A2C: 38.8 ml LV SV:        82 ml LV vol s, MOD A4C: 33.5 ml LV SV MOD A2C:     57.6 ml LV SV MOD A4C:     94.1 ml LV SV MOD BP:      60.4 ml RIGHT VENTRICLE RV Basal diam:  3.70 cm RV Mid diam:    2.80 cm TAPSE (M-mode): 2.1 cm LEFT ATRIUM             Index        RIGHT ATRIUM           Index LA diam:        3.20 cm 1.73 cm/m   RA Area:     16.50 cm LA Vol (A2C):   56.4 ml 30.51 ml/m  RA Volume:   46.50 ml  25.16 ml/m LA Vol (A4C):   54.1 ml 29.27 ml/m LA Biplane Vol: 59.1 ml 31.97 ml/m  AORTIC VALVE                    PULMONIC VALVE AV Area (Vmax):    2.79 cm     PV Vmax:       0.80 m/s AV Area (Vmean):   2.68 cm     PV Peak grad:  2.6 mmHg AV Area (VTI):     3.11 cm AV Vmax:           139.00 cm/s AV Vmean:          97.400 cm/s AV VTI:            0.301 m AV Peak Grad:      7.7 mmHg AV Mean Grad:      4.0 mmHg LVOT Vmax:         112.00 cm/s LVOT Vmean:        75.300 cm/s LVOT VTI:          0.270 m LVOT/AV VTI ratio: 0.90  AORTA Ao Root diam: 3.50 cm Ao Asc diam:  3.80 cm MITRAL VALVE MV Area (PHT): 3.93 cm    SHUNTS MV Decel Time: 193 msec    Systemic VTI:  0.27 m MR Peak grad: 14.6 mmHg    Systemic Diam: 2.10 cm MR Vmax:      191.33 cm/s MV E velocity: 84.80 cm/s MV A velocity: 96.00 cm/s MV E/A ratio:  0.88 Oswaldo Milian MD Electronically signed by Oswaldo Milian MD Signature Date/Time: 12/06/2022/2:46:18 PM    Final    DG Abd 1 View  Result Date: 12/06/2022 CLINICAL DATA:  Nasogastric tube placement. EXAM: ABDOMEN - 1 VIEW COMPARISON:  Same day. FINDINGS: Distal tip of nasogastric tube is seen in expected position of proximal stomach. IMPRESSION: Distal tip of nasogastric tube is seen in expected position of proximal stomach. Electronically Signed   By: Marijo Conception M.D.   On: 12/06/2022 14:07   DG Abd Portable 1V  Result Date: 12/06/2022 CLINICAL DATA:  Small-bowel obstruction, abdominal distension. EXAM: PORTABLE ABDOMEN - 1 VIEW COMPARISON:  12/05/2022. FINDINGS: Persistent gaseous distention of small bowel. Scattered gas and oral  contrast in the colon. IMPRESSION: Persistent partial small bowel obstruction. Electronically Signed   By: Lorin Picket M.D.   On: 12/06/2022 10:58   DG Abd Portable 1V-Small Bowel Obstruction Protocol-initial, 8 hr delay  Result Date: 12/05/2022 CLINICAL DATA:  Small bowel obstruction EXAM: PORTABLE ABDOMEN - 1 VIEW COMPARISON:  12/05/2022, CT 12/05/2022 FINDINGS: Esophageal tube tip overlies the mid stomach. Contrast in the urinary bladder. Dilated small bowel measuring up to 5.3 cm with some distal gas noted. IMPRESSION: 1. Esophageal tube tip overlies the mid stomach. 2. Dilated small bowel with some distal gas. Findings consistent with bowel obstruction, this does not appear significantly changed Electronically Signed   By: Donavan Foil M.D.   On: 12/05/2022 21:55    Anti-infectives: Anti-infectives (From admission, onward)    Start     Dose/Rate Route Frequency Ordered Stop   12/07/22 1000  metroNIDAZOLE (FLAGYL) IVPB 500 mg        500 mg 100 mL/hr over 60 Minutes Intravenous Every 12 hours 12/07/22 0810     12/05/22 0815  cefTRIAXone (ROCEPHIN) 1 g in sodium chloride 0.9 % 100 mL IVPB        1 g 200 mL/hr over 30 Minutes Intravenous Every 24 hours 12/05/22 0800     12/05/22 0815  metroNIDAZOLE (FLAGYL) IVPB 500 mg  Status:  Discontinued        500 mg 100 mL/hr over 60 Minutes Intravenous Every 8 hours 12/05/22 0800 12/07/22 0810        Assessment/Plan pSBO vs Enteritis - Patient presents with nausea, vomiting, and diarrhea. He has no h/o prior abdominal surgery. - CT scan 2/6 questions possible SBO with gradual transition to nondilated small bowel in the region of the jejunum or proximal ileum where  there are segments of thickened small bowel that appear more consistent with enteritis  - NG replaced last night due to n/v. Feeling better this morning and having bowel function. Xray yesterday showed contrast in the colon so he is obviously not fully obstructed. Likely has a bad enteritis. If ongoing diarrhea may want to consider checking stool studies. Given that he failed NG being out yesterday may want to continue bowel rest with NG to LIWS today, and could consider clamping trial tomorrow.  FEN - IVF, NPO/NGT to LIWS VTE - SCDs, lovenox ID - rocephin/flagyl    I reviewed last 24 h vitals and pain scores, last 48 h intake and output, last 24 h labs and trends, and last 24 h imaging results.    LOS: 2 days    [facility] 12/07/2022, 9:39 AM Please see Amion for pager number during day hours 7:00am-4:30pm

## 2022-12-07 NOTE — Progress Notes (Signed)
PHARMACY - PHYSICIAN COMMUNICATION CRITICAL VALUE ALERT - BLOOD CULTURE IDENTIFICATION (BCID)  Manuel Reyes is an 72 y.o. male who presented to [facility] on 12/05/2022 with a chief complaint of SOB/vomiting  Assessment: 15 YOM with concern for intra-abdominal infection vs PNA. Now with 1 of 4 blood cultures growing GPC in clusters with BCID not detecting an organism - could be representative of a contaminant such as micrococcus or aerococcus.   Name of physician (or Provider) Contacted: FMTS  Current antibiotics: Rocephin + Flagyl  Changes to prescribed antibiotics recommended:  None - current regimen appropriate. Monitor additional culture results and final identification of organism.  Results for orders placed or performed during the hospital encounter of 12/05/22  Blood Culture ID Panel (Reflexed) (Collected: 12/05/2022  9:12 AM)  Result Value Ref Range   Enterococcus faecalis NOT DETECTED NOT DETECTED   Enterococcus Faecium NOT DETECTED NOT DETECTED   Listeria monocytogenes NOT DETECTED NOT DETECTED   Staphylococcus species NOT DETECTED NOT DETECTED   Staphylococcus aureus (BCID) NOT DETECTED NOT DETECTED   Staphylococcus epidermidis NOT DETECTED NOT DETECTED   Staphylococcus lugdunensis NOT DETECTED NOT DETECTED   Streptococcus species NOT DETECTED NOT DETECTED   Streptococcus agalactiae NOT DETECTED NOT DETECTED   Streptococcus pneumoniae NOT DETECTED NOT DETECTED   Streptococcus pyogenes NOT DETECTED NOT DETECTED   A.calcoaceticus-baumannii NOT DETECTED NOT DETECTED   Bacteroides fragilis NOT DETECTED NOT DETECTED   Enterobacterales NOT DETECTED NOT DETECTED   Enterobacter cloacae complex NOT DETECTED NOT DETECTED   Escherichia coli NOT DETECTED NOT DETECTED   Klebsiella aerogenes NOT DETECTED NOT DETECTED   Klebsiella oxytoca NOT DETECTED NOT DETECTED   Klebsiella pneumoniae NOT DETECTED NOT DETECTED   Proteus species NOT DETECTED NOT DETECTED   Salmonella species NOT  DETECTED NOT DETECTED   Serratia marcescens NOT DETECTED NOT DETECTED   Haemophilus influenzae NOT DETECTED NOT DETECTED   Neisseria meningitidis NOT DETECTED NOT DETECTED   Pseudomonas aeruginosa NOT DETECTED NOT DETECTED   Stenotrophomonas maltophilia NOT DETECTED NOT DETECTED   Candida albicans NOT DETECTED NOT DETECTED   Candida auris NOT DETECTED NOT DETECTED   Candida glabrata NOT DETECTED NOT DETECTED   Candida krusei NOT DETECTED NOT DETECTED   Candida parapsilosis NOT DETECTED NOT DETECTED   Candida tropicalis NOT DETECTED NOT DETECTED   Cryptococcus neoformans/gattii NOT DETECTED NOT DETECTED    Thank you for allowing pharmacy to be a part of this patient's care.  Alycia Rossetti, PharmD, BCPS Infectious Diseases Clinical Pharmacist 12/07/2022 8:10 AM   **Pharmacist phone directory can now be found on [email or web address] (PW [email or web address]).  Listed under [place].

## 2022-12-07 NOTE — Progress Notes (Signed)
     Daily Progress Note Intern Pager: [phone]  Patient name: Manuel Reyes Medical record number: [id number] Date of birth: 1951/04/28 Age: 72 y.o. Gender: male  Primary Care Provider: Eppie Gibson, MD Consultants: Gen Surg Code Status: Full  Pt Overview and Major Events to Date:  2/6 Admitted  Assessment and Plan: BS is a 72yo M admitted for SBO.   PMH pertinent for seizures, polycythemia, recent dark stools, HTN, DM2, dementia.   * Small bowel obstruction (HCC) Subjectively, pt feels abm pain is improving. Exam notable for improving abm tenderness compared to yesterday. Having more loose stools. Gen Surg recommend continuing NG ON, then clamp trial tomorrow. - Cont NG tube on intermittent suction. Clamp trial tomorrow.  - Add d5 to mIVF while NPO - Gen surg consulted, appreciate recs - Tylenol prn for pain and fever  Enteritis Initially met SIRs criteria on admission, suspect enteritis leading to SBO developing.  - Plan to finish 3 day course of CTX and flagyl (last dose today)  Polycythemia Stable and chronic - needs outpt sleep study - AM CBC  Seizure-like activity (HCC) - Cont Keppra 500 IV BID (can transition back to oral when Poing) - Seizure precautions  Essential hypertension - Restart home amlodipine, lisinopril, HCTZ once able to p.o. - consider prn Hydral if BP >200/110   Chronic diastolic heart failure ([place]) Echo shows EF 92-42 and no diastolic dysfunction, improved from prior echo.   History of diabetes mellitus A1c 5.8, @ goal.  - Cont SSI.   FEN/GI: NPO, d5NS @ 120 PPx: Lovenox Dispo:Pending PT recommendations  pending clinical improvement . Barriers include need for NG tube and IVF.   Subjective:  Feels like his abm pain is improving. Reports having loose stools (watery and greenish). No emesis.   Objective: Temp:  [97.6 F (36.4 C)-98.4 F (36.9 C)] 98.4 F (36.9 C) (02/08 0739) Pulse Rate:  [62-70] 64 (02/08 0739) Resp:   [15-18] 16 (02/08 0739) BP: (137-147)/(86-92) 143/86 (02/08 0739) SpO2:  [93 %-96 %] 93 % (02/08 0739) Physical Exam: General: Older man alert and laying in bed, NG tube in place. NAD. Cardiovascular: RRR, no murmurs. Respiratory: CTAB. Normal WOB on RA. Abdomen: Mild tenderness to deep palpation of L sided abm. Soft. Normal BS. Extremities: No pitting edema BL  Laboratory: Most recent CBC Lab Results  Component Value Date   WBC 4.9 12/07/2022   HGB 15.9 12/07/2022   HCT 46.9 12/07/2022   MCV 93.6 12/07/2022   PLT 130 (L) 12/07/2022   Most recent BMP    Latest Ref Rng & Units 12/07/2022    3:34 AM  BMP  Glucose 70 - 99 mg/dL 73   BUN 8 - 23 mg/dL 18   Creatinine 0.61 - 1.24 mg/dL 0.80   Sodium 135 - 145 mmol/L 138   Potassium 3.5 - 5.1 mmol/L 3.9   Chloride 98 - 111 mmol/L 107   CO2 22 - 32 mmol/L 20   Calcium 8.9 - 10.3 mg/dL 7.6    Arlyce Dice, MD 12/07/2022, 2:12 PM  PGY-1, [place] Intern pager: [phone], text pages welcome Secure chat group [place]

## 2022-12-08 ENCOUNTER — Inpatient Hospital Stay (HOSPITAL_COMMUNITY): Payer: Medicaid Other

## 2022-12-08 DIAGNOSIS — K56609 Unspecified intestinal obstruction, unspecified as to partial versus complete obstruction: Secondary | ICD-10-CM | POA: Diagnosis not present

## 2022-12-08 LAB — BASIC METABOLIC PANEL
Anion gap: 9 (ref 5–15)
BUN: 9 mg/dL (ref 8–23)
CO2: 20 mmol/L — ABNORMAL LOW (ref 22–32)
Calcium: 7.6 mg/dL — ABNORMAL LOW (ref 8.9–10.3)
Chloride: 108 mmol/L (ref 98–111)
Creatinine, Ser: 0.56 mg/dL — ABNORMAL LOW (ref 0.61–1.24)
GFR, Estimated: >60 mL/min (ref >60)
Glucose, Bld: 142 mg/dL — ABNORMAL HIGH (ref 70–99)
Potassium: 3.4 mmol/L — ABNORMAL LOW (ref 3.5–5.1)
Sodium: 137 mmol/L (ref 135–145)

## 2022-12-08 LAB — CBC
HCT: 45 % (ref 39.0–52.0)
Hemoglobin: 15 g/dL (ref 13.0–17.0)
MCH: 31.3 pg (ref 26.0–34.0)
MCHC: 33.3 g/dL (ref 30.0–36.0)
MCV: 93.8 fL (ref 80.0–100.0)
Platelets: 129 10*3/uL — ABNORMAL LOW (ref 150–400)
RBC: 4.8 MIL/uL (ref 4.22–5.81)
RDW: 13.3 % (ref 11.5–15.5)
WBC: 5.5 10*3/uL (ref 4.0–10.5)
nRBC: 0 % (ref 0.0–0.2)

## 2022-12-08 LAB — GLUCOSE, CAPILLARY
Glucose-Capillary: 155 mg/dL — ABNORMAL HIGH (ref 70–99)
Glucose-Capillary: 129 mg/dL — ABNORMAL HIGH (ref 70–99)
Glucose-Capillary: 146 mg/dL — ABNORMAL HIGH (ref 70–99)
Glucose-Capillary: 80 mg/dL (ref 70–99)
Glucose-Capillary: 86 mg/dL (ref 70–99)

## 2022-12-08 MED ORDER — POTASSIUM CHLORIDE IN NACL 20-0.9 MEQ/L-% IV SOLN
INTRAVENOUS | Status: DC
  Filled 2022-12-08 (×3): qty 1000

## 2022-12-08 NOTE — Progress Notes (Signed)
Called son Dharma Puzio-confirmed DOB and updated him on patient's hospital course so far. All questions answered.

## 2022-12-08 NOTE — Progress Notes (Addendum)
     Daily Progress Note Intern Pager: [phone]  Patient name: Manuel Reyes Medical record number: [id number] Date of birth: [birth year] Age: 72 y.o. Gender: male  Primary Care Provider: Eppie Gibson, MD Consultants: Gen Surgery Code Status: Full  Pt Overview and Major Events to Date:  2/6 Admitted  Assessment and Plan: BS is a 72yo M admitted for SBO.   PMH pertinent for seizures, polycythemia, recent dark stools, HTN, DM2, dementia.   * Small bowel obstruction (HCC) Continues to improve, abm exam nontender, no emesis, and having BM's. KUB today appears unchanged, but physical exam is improved from yesterday. Could potential trial clamping today or wait until tomorrow; will follow-up Gen Surg recs. - Cont NG tube at intermittent suction - Switch IVF to NS+45mq K. Glucose wnl, doesn't need dextrose currently.  - Gen surg consulted, appreciate recs - Tylenol prn for pain and fever  Enteritis Initially met SIRs criteria on admission, suspect enteritis leading to SBO developing.  - s/p 3 day course CTX and flagyl  Seizure-like activity (HCC) - Cont Keppra 500 IV BID (can transition back to oral when Poing) - Seizure precautions  Essential hypertension - Restart home amlodipine, lisinopril, HCTZ once able to p.o. - consider prn Hydral if BP >200/110   Chronic diastolic heart failure (HCC) Echo shows EF 654-65and no diastolic dysfunction, improved from prior echo.   History of diabetes mellitus Stop SSI and check CBGs q8h. Can restart SSI if needed.   FEN/GI: NPO. NS + 23mK @ 12581mr. PPx: Lovenox Dispo:Pending PT recommendations  pending clinical improvement . Barriers include need for NG tube and IVF.   Subjective:  Feels like his abm pain is improved, no longer vomiting. Having BM's, denies diarrhea. Pt wondering when NG tube can be removed. L eye returned to normal, did not need eyedrops.  Objective: Temp:  [97.6 F (36.4 C)-98.5 F (36.9 C)] 97.6 F  (36.4 C) (02/09 0824) Pulse Rate:  [51-66] 51 (02/09 0824) Resp:  [16-17] 17 (02/09 0824) BP: (144-170)/(92-95) 170/95 (02/09 0824) SpO2:  [91 %-97 %] 91 % (02/09 0824) Physical Exam: General: Alert, laying comfortably in bed, NAD. HEENT: L eye no longer erythematous. NG tube in place Cardiovascular: RRR Respiratory: CTAB. Normal WOB on RA. Abdomen: Soft, distended. Nontender to palpation. Normal BS.  Laboratory: Most recent CBC Lab Results  Component Value Date   WBC 5.5 12/08/2022   HGB 15.0 12/08/2022   HCT 45.0 12/08/2022   MCV 93.8 12/08/2022   PLT 129 (L) 12/08/2022   Most recent BMP    Latest Ref Rng & Units 12/08/2022    4:13 AM  BMP  Glucose 70 - 99 mg/dL 142   BUN 8 - 23 mg/dL 9   Creatinine 0.61 - 1.24 mg/dL 0.56   Sodium 135 - 145 mmol/L 137   Potassium 3.5 - 5.1 mmol/L 3.4   Chloride 98 - 111 mmol/L 108   CO2 22 - 32 mmol/L 20   Calcium 8.9 - 10.3 mg/dL 7.6    ZheArlyce DiceD 12/08/2022, 8:43 AM  PGY-1, [place] pager: [phone] pages welcome Secure chat group [email or web address]

## 2022-12-08 NOTE — Progress Notes (Signed)
Patient NG tube was clumped at 0845. Offered clear liquid and has been tolerating it well per pt.  Abdomen slightly distended. BP elevated and MD made aware.

## 2022-12-08 NOTE — Plan of Care (Signed)

## 2022-12-08 NOTE — Progress Notes (Signed)
[facility] Progress Note     Subjective: CC-  Son at bedside. No complaints this morning. Denies abdominal pain, n/v. Passing flatus. He had 3 loose stools yesterday and 1 so far this morning.  Objective: Vital signs in last 24 hours: Temp:  [97.6 F (36.4 C)-98.5 F (36.9 C)] 97.6 F (36.4 C) (02/09 0824) Pulse Rate:  [51-66] 51 (02/09 0824) Resp:  [16-17] 17 (02/09 0824) BP: (144-170)/(92-95) 170/95 (02/09 0824) SpO2:  [91 %-97 %] 91 % (02/09 0824) Last BM Date : 12/06/22  Intake/Output from previous day: 02/08 0701 - 02/09 0700 In: 2416.8 [P.O.:100; I.V.:1916.8; IV Piggyback:400] Out: 1225 [Urine:325; Emesis/NG output:900] Intake/Output this shift: Total I/O In: -  Out: 400 [Urine:400]  PE: Gen:  Alert, NAD Abd: soft, minimal distension, bowel sounds present, nontender  Lab Results:  Recent Labs    12/07/22 0334 12/08/22 0413  WBC 4.9 5.5  HGB 15.9 15.0  HCT 46.9 45.0  PLT 130* 129*   BMET Recent Labs    12/07/22 0334 12/08/22 0413  NA 138 137  K 3.9 3.4*  CL 107 108  CO2 20* 20*  GLUCOSE 73 142*  BUN 18 9  CREATININE 0.80 0.56*  CALCIUM 7.6* 7.6*   PT/INR No results for input(s): "LABPROT", "INR" in the last 72 hours. CMP     Component Value Date/Time   NA 137 12/08/2022 0413   NA 137 11/02/2022 1528   K 3.4 (L) 12/08/2022 0413   CL 108 12/08/2022 0413   CO2 20 (L) 12/08/2022 0413   GLUCOSE 142 (H) 12/08/2022 0413   BUN 9 12/08/2022 0413   BUN 11 11/02/2022 1528   CREATININE 0.56 (L) 12/08/2022 0413   CREATININE 0.69 (L) 11/26/2015 1334   CALCIUM 7.6 (L) 12/08/2022 0413   PROT 6.6 12/06/2022 0507   PROT 7.5 11/02/2022 1528   ALBUMIN 3.0 (L) 12/06/2022 0507   ALBUMIN 4.5 11/02/2022 1528   AST 24 12/06/2022 0507   ALT 19 12/06/2022 0507   ALKPHOS 52 12/06/2022 0507   BILITOT 0.8 12/06/2022 0507   BILITOT 0.6 11/02/2022 1528   GFRNONAA >60 12/08/2022 0413   GFRNONAA >89 11/26/2015 1334   GFRAA 104 08/08/2019 1341    GFRAA >89 11/26/2015 1334   Lipase     Component Value Date/Time   LIPASE 25 12/05/2022 0454       Studies/Results: DG Abd Portable 1V  Result Date: 12/08/2022 CLINICAL DATA:  [id number]. Small-bowel obstruction with abdominal distention and discomfort. EXAM: PORTABLE ABDOMEN - 1 VIEW COMPARISON:  Portable study yesterday at 1:56 p.m. FINDINGS: 4:53 a.m. NGT again terminates at the mid body of stomach. There is persistent multifocal small-bowel dilatation up to 3.7 cm, showing minimal to no improvement since February 7. Colonic aeration continues to be seen at least through to the distal descending/sigmoid junction. Visceral shadows are stable and there is no supine evidence of free air. IMPRESSION: Persistent multifocal small-bowel dilatation up to 3.7 cm, showing minimal to no improvement since February 7. Electronically Signed   By: Telford Nab M.D.   On: 12/08/2022 05:36   ECHOCARDIOGRAM COMPLETE  Result Date: 12/06/2022    ECHOCARDIOGRAM REPORT   Patient Name:   Manuel Reyes Date of Exam: 12/06/2022 Medical Rec #:  [id number]        Height:       64.0 in Accession #:    [id number]       Weight:       175.0 lb Date of Birth:  1951-10-29         BSA:          1.848 m Patient Age:    5 years         BP:           131/106 mmHg Patient Gender: M                HR:           78 bpm. Exam Location:  Inpatient Procedure: 2D Echo Indications:    dyspnea  History:        Patient has prior history of Echocardiogram examinations, most                 recent 06/16/2015. Risk Factors:Hypertension, Diabetes and                 Dyslipidemia.  Sonographer:    Harvie Junior Referring Phys: [phone] Delorse Limber MCINTYRE  Sonographer Comments: Technically difficult study due to poor echo windows and suboptimal subcostal window. IMPRESSIONS  1. Left ventricular ejection fraction, by estimation, is 65 to 70%. The left ventricle has normal function. The left ventricle has no regional wall motion abnormalities. Left  ventricular diastolic parameters were normal.  2. Right ventricular systolic function is normal. The right ventricular size is normal. Tricuspid regurgitation signal is inadequate for assessing PA pressure.  3. The mitral valve is normal in structure. No evidence of mitral valve regurgitation. No evidence of mitral stenosis.  4. The aortic valve is tricuspid. Aortic valve regurgitation is not visualized. No aortic stenosis is present.  5. Aortic dilatation noted. There is mild dilatation of the ascending aorta, measuring 38 mm.  6. The inferior vena cava is normal in size with greater than 50% respiratory variability, suggesting right atrial pressure of 3 mmHg. FINDINGS  Left Ventricle: Left ventricular ejection fraction, by estimation, is 65 to 70%. The left ventricle has normal function. The left ventricle has no regional wall motion abnormalities. The left ventricular internal cavity size was normal in size. There is  no left ventricular hypertrophy. Left ventricular diastolic parameters were normal. Right Ventricle: The right ventricular size is normal. No increase in right ventricular wall thickness. Right ventricular systolic function is normal. Tricuspid regurgitation signal is inadequate for assessing PA pressure. Left Atrium: Left atrial size was normal in size. Right Atrium: Right atrial size was normal in size. Pericardium: Trivial pericardial effusion is present. Presence of epicardial fat layer. Mitral Valve: The mitral valve is normal in structure. No evidence of mitral valve regurgitation. No evidence of mitral valve stenosis. Tricuspid Valve: The tricuspid valve is normal in structure. Tricuspid valve regurgitation is trivial. Aortic Valve: The aortic valve is tricuspid. Aortic valve regurgitation is not visualized. No aortic stenosis is present. Aortic valve mean gradient measures 4.0 mmHg. Aortic valve peak gradient measures 7.7 mmHg. Aortic valve area, by VTI measures 3.11 cm. Pulmonic Valve: The  pulmonic valve was not well visualized. Pulmonic valve regurgitation is not visualized. Aorta: The aortic root is normal in size and structure and aortic dilatation noted. There is mild dilatation of the ascending aorta, measuring 38 mm. Venous: The inferior vena cava is normal in size with greater than 50% respiratory variability, suggesting right atrial pressure of 3 mmHg. IAS/Shunts: The interatrial septum was not well visualized.  LEFT VENTRICLE PLAX 2D LVIDd:         3.90 cm     Diastology LVIDs:         2.50 cm  LV e' medial:    6.09 cm/s LV PW:         0.90 cm     LV E/e' medial:  13.9 LV IVS:        0.90 cm     LV e' lateral:   11.20 cm/s LVOT diam:     2.10 cm     LV E/e' lateral: 7.6 LV SV:         94 LV SV Index:   51 LVOT Area:     3.46 cm                             3D Volume EF: LV Volumes (MOD)           3D EF:        73 % LV vol d, MOD A2C: 96.4 ml LV EDV:       112 ml LV vol d, MOD A4C: 94.1 ml LV ESV:       30 ml LV vol s, MOD A2C: 38.8 ml LV SV:        82 ml LV vol s, MOD A4C: 33.5 ml LV SV MOD A2C:     57.6 ml LV SV MOD A4C:     94.1 ml LV SV MOD BP:      60.4 ml RIGHT VENTRICLE RV Basal diam:  3.70 cm RV Mid diam:    2.80 cm TAPSE (M-mode): 2.1 cm LEFT ATRIUM             Index        RIGHT ATRIUM           Index LA diam:        3.20 cm 1.73 cm/m   RA Area:     16.50 cm LA Vol (A2C):   56.4 ml 30.51 ml/m  RA Volume:   46.50 ml  25.16 ml/m LA Vol (A4C):   54.1 ml 29.27 ml/m LA Biplane Vol: 59.1 ml 31.97 ml/m  AORTIC VALVE                    PULMONIC VALVE AV Area (Vmax):    2.79 cm     PV Vmax:       0.80 m/s AV Area (Vmean):   2.68 cm     PV Peak grad:  2.6 mmHg AV Area (VTI):     3.11 cm AV Vmax:           139.00 cm/s AV Vmean:          97.400 cm/s AV VTI:            0.301 m AV Peak Grad:      7.7 mmHg AV Mean Grad:      4.0 mmHg LVOT Vmax:         112.00 cm/s LVOT Vmean:        75.300 cm/s LVOT VTI:          0.270 m LVOT/AV VTI ratio: 0.90  AORTA Ao Root diam: 3.50 cm Ao Asc diam:   3.80 cm MITRAL VALVE MV Area (PHT): 3.93 cm    SHUNTS MV Decel Time: 193 msec    Systemic VTI:  0.27 m MR Peak grad: 14.6 mmHg    Systemic Diam: 2.10 cm MR Vmax:      191.33 cm/s MV E velocity: 84.80 cm/s MV A velocity: 96.00 cm/s MV E/A ratio:  0.88 Oswaldo Milian  MD Electronically signed by Oswaldo Milian MD Signature Date/Time: 12/06/2022/2:46:18 PM    Final    DG Abd 1 View  Result Date: 12/06/2022 CLINICAL DATA:  Nasogastric tube placement. EXAM: ABDOMEN - 1 VIEW COMPARISON:  Same day. FINDINGS: Distal tip of nasogastric tube is seen in expected position of proximal stomach. IMPRESSION: Distal tip of nasogastric tube is seen in expected position of proximal stomach. Electronically Signed   By: Marijo Conception M.D.   On: 12/06/2022 14:07   DG Abd Portable 1V  Result Date: 12/06/2022 CLINICAL DATA:  Small-bowel obstruction, abdominal distension. EXAM: PORTABLE ABDOMEN - 1 VIEW COMPARISON:  12/05/2022. FINDINGS: Persistent gaseous distention of small bowel. Scattered gas and oral contrast in the colon. IMPRESSION: Persistent partial small bowel obstruction. Electronically Signed   By: Lorin Picket M.D.   On: 12/06/2022 10:58    Anti-infectives: Anti-infectives (From admission, onward)    Start     Dose/Rate Route Frequency Ordered Stop   12/07/22 1000  metroNIDAZOLE (FLAGYL) IVPB 500 mg        500 mg 100 mL/hr over 60 Minutes Intravenous Every 12 hours 12/07/22 0810 12/08/22 1100   12/05/22 0815  cefTRIAXone (ROCEPHIN) 1 g in sodium chloride 0.9 % 100 mL IVPB        1 g 200 mL/hr over 30 Minutes Intravenous Every 24 hours 12/05/22 0800 12/07/22 1224   12/05/22 0815  metroNIDAZOLE (FLAGYL) IVPB 500 mg  Status:  Discontinued        500 mg 100 mL/hr over 60 Minutes Intravenous Every 8 hours 12/05/22 0800 12/07/22 0810        Assessment/Plan pSBO vs Enteritis - Patient presents with nausea, vomiting, and diarrhea. He has no h/o prior abdominal surgery. - CT scan 2/6 questions  possible SBO with gradual transition to nondilated small bowel in the region of the jejunum or proximal ileum where there are segments of thickened small bowel that appear more consistent with enteritis  - Xray 2/7 showed contrast in the colon so he is obviously not fully obstructed. Likely has a bad enteritis. Continues to have diarrhea, may want to consider checking stool studies.  - Clamp NG and allow sips of clear liquids from the floor. If patient complains of bloating or n/v then return NG to LIWS.    FEN - IVF, clamp NG, sips of clears VTE - SCDs, lovenox ID - rocephin/flagyl 2/6>>2/9   I reviewed last 24 h vitals and pain scores, last 48 h intake and output, last 24 h labs and trends, and last 24 h imaging results.    LOS: 3 days    [facility] 12/08/2022, 9:29 AM Please see Amion for pager number during day hours 7:00am-4:30pm

## 2022-12-09 DIAGNOSIS — K56609 Unspecified intestinal obstruction, unspecified as to partial versus complete obstruction: Secondary | ICD-10-CM | POA: Diagnosis not present

## 2022-12-09 LAB — CBC
HCT: 44.4 % (ref 39.0–52.0)
Hemoglobin: 15.4 g/dL (ref 13.0–17.0)
MCH: 31.7 pg (ref 26.0–34.0)
MCHC: 34.7 g/dL (ref 30.0–36.0)
MCV: 91.4 fL (ref 80.0–100.0)
Platelets: 138 10*3/uL — ABNORMAL LOW (ref 150–400)
RBC: 4.86 MIL/uL (ref 4.22–5.81)
RDW: 13.2 % (ref 11.5–15.5)
WBC: 7.2 10*3/uL (ref 4.0–10.5)
nRBC: 0 % (ref 0.0–0.2)

## 2022-12-09 LAB — CULTURE, BLOOD (ROUTINE X 2): Special Requests: ADEQUATE

## 2022-12-09 LAB — BASIC METABOLIC PANEL
Anion gap: 9 (ref 5–15)
BUN: 6 mg/dL — ABNORMAL LOW (ref 8–23)
CO2: 20 mmol/L — ABNORMAL LOW (ref 22–32)
Calcium: 7.9 mg/dL — ABNORMAL LOW (ref 8.9–10.3)
Chloride: 107 mmol/L (ref 98–111)
Creatinine, Ser: 0.62 mg/dL (ref 0.61–1.24)
GFR, Estimated: >60 mL/min (ref >60)
Glucose, Bld: 86 mg/dL (ref 70–99)
Potassium: 3.8 mmol/L (ref 3.5–5.1)
Sodium: 136 mmol/L (ref 135–145)

## 2022-12-09 LAB — GLUCOSE, CAPILLARY
Glucose-Capillary: 78 mg/dL (ref 70–99)
Glucose-Capillary: 99 mg/dL (ref 70–99)
Glucose-Capillary: 104 mg/dL — ABNORMAL HIGH (ref 70–99)

## 2022-12-09 NOTE — Progress Notes (Signed)
     Daily Progress Note Intern Pager: [phone]  Patient name: Manuel Reyes Medical record number: [id number] Date of birth: 14-May-1951 Age: 72 y.o. Gender: male  Primary Care Provider: Eppie Gibson, MD Consultants: Gen Surgery Code Status: Full  Pt Overview and Major Events to Date:  2/6 Admitted   Assessment and Plan: Manuel Reyes is a 72 yo M admitted for SBO. PMH pertinent for seizures, polycythemia, recent dark stools, HTN, DM2, dementia.   * Small bowel obstruction (HCC) Abdominal pain fully resolved. No emesis and having bowel movements. KUB yesterday appeared unchanged. NG tube clamped yesterday; will follow-up Gen Surg recs. - Cont NG tube at intermittent suction - Switch IVF to NS+70mq K. Glucose wnl, doesn't need dextrose currently.  - Gen surg consulted, appreciate recs - Tylenol prn for pain and fever  Enteritis Initially met SIRs criteria on admission, suspect enteritis leading to SBO developing.  - s/p 3 day course CTX and flagyl  Seizure-like activity (HCC) - Cont Keppra 500 IV BID (can transition back to oral when Po[place]) - Seizure precautions  Essential hypertension - Restart home amlodipine, lisinopril, HCTZ once able to p.o. - consider prn Hydral if BP >200/110   Chronic diastolic heart failure (HCC) Echo shows EF 606-30and no diastolic dysfunction, improved from prior echo.   History of diabetes mellitus Stop SSI and check CBGs q8h. Can restart SSI if needed.   FEN/GI: NPO. NS + 232mK @ 10048mr. PPx: Lovenox Dispo:Pending PT recommendations  pending clinical improvement. Barriers include need for NG tube and restoration to full diet.   Subjective:  No complaints overnight. Overall feeling much better. Denies abdominal pain - continues to have bowel movements. Asks when NG tube can be removed.  Objective: Temp:  [97.6 F (36.4 C)-98.4 F (36.9 C)] 98.4 F (36.9 C) (02/10 0418) Pulse Rate:  [51-62] 59 (02/10 0418) Resp:  [17] 17  (02/10 0418) BP: (148-170)/(87-100) 148/88 (02/10 0418) SpO2:  [91 %-97 %] 97 % (02/10 0418) Physical Exam: General: well-appearing and in no acute distress HEENT: normocephalic and atraumatic. NG tube in R nostril Respiratory: CTAB posteriorly, normal respiratory effort, and on RA Extremities: moving all extremities spontaneously Gastrointestinal: non-tender and non-distended Cardiovascular: regular rate  Laboratory: Most recent CBC Lab Results  Component Value Date   WBC 7.2 12/09/2022   HGB 15.4 12/09/2022   HCT 44.4 12/09/2022   MCV 91.4 12/09/2022   PLT 138 (L) 12/09/2022   Most recent BMP    Latest Ref Rng & Units 12/09/2022    2:34 AM  BMP  Glucose 70 - 99 mg/dL 86   BUN 8 - 23 mg/dL 6   Creatinine 0.61 - 1.24 mg/dL 0.62   Sodium 135 - 145 mmol/L 136   Potassium 3.5 - 5.1 mmol/L 3.8   Chloride 98 - 111 mmol/L 107   CO2 22 - 32 mmol/L 20   Calcium 8.9 - 10.3 mg/dL 7.9     TanCamelia PhenesD 12/09/2022, 5:35 AM  PGY-1, [facility] pager: [phone] pages welcome Secure chat group [email or web address]

## 2022-12-09 NOTE — Plan of Care (Signed)

## 2022-12-09 NOTE — Progress Notes (Signed)
[facility] Progress Note     Subjective: CC-  Son at bedside. NG has been clamped x24 hours. Patient denies any abdominal pain, nausea, or vomiting. Continues to pass flatus. He had 2-3 BMS yesterday, and 1 more formed stool this morning.  Objective: Vital signs in last 24 hours: Temp:  [97.4 F (36.3 C)-98.4 F (36.9 C)] 97.4 F (36.3 C) (02/10 0734) Pulse Rate:  [58-62] 61 (02/10 0734) Resp:  [17] 17 (02/10 0418) BP: (148-168)/(87-100) 168/99 (02/10 0734) SpO2:  [96 %-97 %] 96 % (02/10 0734) Last BM Date : 12/06/22  Intake/Output from previous day: 02/09 0701 - 02/10 0700 In: 1543.9 [P.O.:1; I.V.:1342.9; IV Piggyback:200] Out: 725 [Urine:725] Intake/Output this shift: No intake/output data recorded.  PE: Gen:  Alert, NAD Abd: soft, mild distension, bowel sounds present, nontender  Lab Results:  Recent Labs    12/08/22 0413 12/09/22 0234  WBC 5.5 7.2  HGB 15.0 15.4  HCT 45.0 44.4  PLT 129* 138*   BMET Recent Labs    12/08/22 0413 12/09/22 0234  NA 137 136  K 3.4* 3.8  CL 108 107  CO2 20* 20*  GLUCOSE 142* 86  BUN 9 6*  CREATININE 0.56* 0.62  CALCIUM 7.6* 7.9*   PT/INR No results for input(s): "LABPROT", "INR" in the last 72 hours. CMP     Component Value Date/Time   NA 136 12/09/2022 0234   NA 137 11/02/2022 1528   K 3.8 12/09/2022 0234   CL 107 12/09/2022 0234   CO2 20 (L) 12/09/2022 0234   GLUCOSE 86 12/09/2022 0234   BUN 6 (L) 12/09/2022 0234   BUN 11 11/02/2022 1528   CREATININE 0.62 12/09/2022 0234   CREATININE 0.69 (L) 11/26/2015 1334   CALCIUM 7.9 (L) 12/09/2022 0234   PROT 6.6 12/06/2022 0507   PROT 7.5 11/02/2022 1528   ALBUMIN 3.0 (L) 12/06/2022 0507   ALBUMIN 4.5 11/02/2022 1528   AST 24 12/06/2022 0507   ALT 19 12/06/2022 0507   ALKPHOS 52 12/06/2022 0507   BILITOT 0.8 12/06/2022 0507   BILITOT 0.6 11/02/2022 1528   GFRNONAA >60 12/09/2022 0234   GFRNONAA >89 11/26/2015 1334   GFRAA 104 08/08/2019 1341    GFRAA >89 11/26/2015 1334   Lipase     Component Value Date/Time   LIPASE 25 12/05/2022 0454       Studies/Results: DG Abd Portable 1V  Result Date: 12/08/2022 CLINICAL DATA:  [id number]. Small-bowel obstruction with abdominal distention and discomfort. EXAM: PORTABLE ABDOMEN - 1 VIEW COMPARISON:  Portable study yesterday at 1:56 p.m. FINDINGS: 4:53 a.m. NGT again terminates at the mid body of stomach. There is persistent multifocal small-bowel dilatation up to 3.7 cm, showing minimal to no improvement since February 7. Colonic aeration continues to be seen at least through to the distal descending/sigmoid junction. Visceral shadows are stable and there is no supine evidence of free air. IMPRESSION: Persistent multifocal small-bowel dilatation up to 3.7 cm, showing minimal to no improvement since February 7. Electronically Signed   By: Telford Nab M.D.   On: 12/08/2022 05:36    Anti-infectives: Anti-infectives (From admission, onward)    Start     Dose/Rate Route Frequency Ordered Stop   12/07/22 1000  metroNIDAZOLE (FLAGYL) IVPB 500 mg        500 mg 100 mL/hr over 60 Minutes Intravenous Every 12 hours 12/07/22 0810 12/08/22 1100   12/05/22 0815  cefTRIAXone (ROCEPHIN) 1 g in sodium chloride 0.9 % 100 mL IVPB  1 g 200 mL/hr over 30 Minutes Intravenous Every 24 hours 12/05/22 0800 12/07/22 1224   12/05/22 0815  metroNIDAZOLE (FLAGYL) IVPB 500 mg  Status:  Discontinued        500 mg 100 mL/hr over 60 Minutes Intravenous Every 8 hours 12/05/22 0800 12/07/22 0810        Assessment/Plan pSBO vs Enteritis - Patient presents with nausea, vomiting, and diarrhea. He has no h/o prior abdominal surgery. - CT scan 2/6 questions possible SBO with gradual transition to nondilated small bowel in the region of the jejunum or proximal ileum where there are segments of thickened small bowel that appear more consistent with enteritis  - Xray 2/7 showed contrast in the colon so he is obviously  not fully obstructed. Likely has a bad enteritis. - Tolerated clamping trial x24 hours and continues to have bowel function. Diarrhea improving. D/c NG. [place] for full liquids advance diet as tolerated. Surgery will sign off, please call with questions or concerns.    FEN - FLD ADAT VTE - SCDs, lovenox ID - rocephin/flagyl 2/6>>2/9   I reviewed last 24 h vitals and pain scores, last 48 h intake and output, last 24 h labs and trends, and last 24 h imaging results.    LOS: 4 days    [facility] 12/09/2022, 8:48 AM Please see Amion for pager number during day hours 7:00am-4:30pm

## 2022-12-10 DIAGNOSIS — K56609 Unspecified intestinal obstruction, unspecified as to partial versus complete obstruction: Secondary | ICD-10-CM | POA: Diagnosis not present

## 2022-12-10 LAB — BASIC METABOLIC PANEL
Anion gap: 9 (ref 5–15)
BUN: 6 mg/dL — ABNORMAL LOW (ref 8–23)
CO2: 23 mmol/L (ref 22–32)
Calcium: 8.6 mg/dL — ABNORMAL LOW (ref 8.9–10.3)
Chloride: 102 mmol/L (ref 98–111)
Creatinine, Ser: 0.59 mg/dL — ABNORMAL LOW (ref 0.61–1.24)
GFR, Estimated: >60 mL/min (ref >60)
Glucose, Bld: 104 mg/dL — ABNORMAL HIGH (ref 70–99)
Potassium: 3.6 mmol/L (ref 3.5–5.1)
Sodium: 134 mmol/L — ABNORMAL LOW (ref 135–145)

## 2022-12-10 LAB — CULTURE, BLOOD (ROUTINE X 2)
Culture: NO GROWTH
Special Requests: ADEQUATE

## 2022-12-10 LAB — GLUCOSE, CAPILLARY: Glucose-Capillary: 94 mg/dL (ref 70–99)

## 2022-12-10 MED ORDER — LEVETIRACETAM 500 MG PO TABS: 500.0000 mg | ORAL_TABLET | Freq: Two times a day (BID) | ORAL | Status: DC

## 2022-12-10 NOTE — Discharge Summary (Shared)
[facility] Discharge Summary  Patient name: Manuel Reyes Medical record number: [id number] Date of birth: 1951-09-02 Age: 72 y.o. Gender: male Date of Admission: 12/05/2022  Date of Discharge: 12/10/22 Admitting Physician: Leeanne Rio, MD  Primary Care Provider: Eppie Gibson, MD Consultants: Gen Surg  Indication for Hospitalization: SBO  Brief Hospital Course:  Manuel Reyes is a 72 y.o. male who was admitted for small bowel obstruction. PMH significant for HTN, HLD, HFpEF, GERD, T2DM, BPH, seizures, depression, and dementia.  Partial Small Bowel Obstruction Presented with 1 day of abdominal pain, vomiting, and fever. CT abd/pelvis showed small bowel obstruction likely secondary to enteritis. Gen surg followed the patient and he was managed non-operatively with NG tube and bowel rest. Pt subsequently improved, having regular BM's, tolerating regular diet, and abdominal exam stable. At time of discharge, pt was tolerating PO and restarted on home PO medications.  Enteritis  rule-out Aspiration Pneumonia Patient endorsed SOB on arrival and met SIRS criteria. CXR without acute findings but he was covered with Ceftriaxone and Flagyl for possible aspiration pneumonia. Pt subsequently had normal respiratory status and did NOT think he truly had PNA. Pt also had enteritis that likely led to SBO formation. Pt finished a 3 day course of CTX and flagyl for enteritis.  Hx of HFpEF Given initial dyspnea on presentation, echo was obtained and showed EF [id number] and no diastolic dysfunction (improved from prior echo).   Polycythemia Noted to have chronic and stable polycythemia. Likely need outpt sleep study.  Items for PCP follow-up 1) Chronic polycythemia-- Needs sleep study outpt. 2) Held Amlodipine (due to NPO status) and restart home Zestoretic at time of discharge. Restart home amlodipine if able to tolerate at PCP follow-up. 3) Home gabapentin was  not restarted (clarify if he was truly getting this filled and taking it). 4) Recheck CBC, BMP at follow up visit  Discharge Diagnoses/Problem List:  SBO, enteritis, hx of HFpEF, HTN, Polycythemia  Disposition: Home  Discharge Condition: Improved, stable  Discharge Exam:  General: Well-appearing, laying comfortably in bed. NAD. Cardiovascular: RRR Respiratory: CTAB, Normal WOB on RA. Abdomen: Soft, nontender. Distended. Normal BS.  Significant Procedures: None  Significant Labs and Imaging:  No results for input(s): "WBC", "HGB", "HCT", "PLT" in the last 48 hours.  Recent Labs  Lab 12/10/22 1138  NA 134*  K 3.6  CL 102  CO2 23  GLUCOSE 104*  BUN 6*  CREATININE 0.59*  CALCIUM 8.6*    Results/Tests Pending at Time of Discharge: None  Discharge Medications:  Allergies as of 12/10/2022   No Known Allergies      Medication List     STOP taking these medications    amLODipine 10 MG tablet Commonly known as: NORVASC   diclofenac 75 MG EC tablet Commonly known as: VOLTAREN   gabapentin 100 MG capsule Commonly known as: NEURONTIN   gabapentin 600 MG tablet Commonly known as: NEURONTIN   meloxicam 7.5 MG tablet Commonly known as: MOBIC       TAKE these medications    cyclobenzaprine 5 MG tablet Commonly known as: FLEXERIL Take 5 mg by mouth 3 (three) times daily as needed.   donepezil 10 MG tablet Commonly known as: ARICEPT Take 1 tablet (10 mg total) by mouth at bedtime.   levETIRAcetam 500 MG tablet Commonly known as: KEPPRA TAKE ONE TABLET BY MOUTH TWICE DAILY   lisinopril-hydrochlorothiazide 20-25 MG tablet Commonly known as: ZESTORETIC Take 1 tablet by mouth  daily.   memantine 5 MG tablet Commonly known as: NAMENDA TAKE ONE TABLET BY MOUTH TWICE DAILY   omeprazole 20 MG capsule Commonly known as: PRILOSEC TAKE ONE CAPSULE BY MOUTH EVERY DAY   rosuvastatin 40 MG tablet Commonly known as: Crestor Take 1 tablet (40 mg total) by mouth  daily.   Senna Plus 8.6-50 MG tablet Generic drug: senna-docusate TAKE ONE TABLET BY MOUTH EVERY DAY   tamsulosin 0.4 MG Caps capsule Commonly known as: FLOMAX Take 1 capsule (0.4 mg total) by mouth daily.   venlafaxine XR 37.5 MG 24 hr capsule Commonly known as: EFFEXOR-XR TAKE ONE CAPSULE BY MOUTH IN THE MORNING (WITH BREAKFAST) What changed: additional instructions        Discharge Instructions: Please refer to Patient Instructions section of EMR for full details.  Patient was counseled important signs and symptoms that should prompt return to medical care, changes in medications, dietary instructions, activity restrictions, and follow up appointments.   Follow-Up Appointments:  Follow-up Information     Eppie Gibson, MD Follow up.   Specialty: Family Medicine Why: 12/15/22 2:50 pm Contact information: [place] [address] [phone]         Yetta Flock, MD Follow up on 12/11/2022.   Specialty: Gastroenterology Why: 9:50 AM Contact information: [place] [address] [phone]                12/15/22 2:50pm Appointment at [facility] w/ Dr. Janit Bern, Livia Snellen, MD 12/11/2022, 12:17 PM PGY-1, [facility]  Upper Level Addendum:  I have seen and evaluated this patient along with Dr. Markus Jarvis and reviewed the above note, making necessary revisions as appropriate.  I agree with the medical decision making and physical exam as noted above.  Gerrit Heck, MD PGY-2 [facility] Family Medicine Residency

## 2022-12-10 NOTE — Discharge Instructions (Addendum)
Dear Herma Ard,   Thank you so much for allowing [place] to be part of your care!  You were admitted to [facility] for a partial small bowel obstruction due to enteritis. You were treated with an NG tube and followed by surgery.   POST-HOSPITAL & CARE INSTRUCTIONS Please let PCP/Specialists know of any changes that were made.  Please see medications section of this packet for any medication changes.   DOCTOR'S APPOINTMENT & FOLLOW UP CARE INSTRUCTIONS  Future Appointments  Date Time Provider [place]  12/11/2022  9:50 AM Armbruster, Carlota Raspberry, MD [facility]  12/15/2022  2:50 PM Eppie Gibson, MD [facility] [place]    RETURN PRECAUTIONS: Abdominal pain, fevers, vomiting  Take care and be well!  [facility]  [place], [place] [address] [phone]

## 2022-12-10 NOTE — Progress Notes (Signed)
     Daily Progress Note Intern Pager: [phone]  Patient name: Manuel Reyes Medical record number: [id number] Date of birth: [birth year] Age: 72 y.o. Gender: male  Primary Care Provider: Eppie Gibson, MD Consultants: Gen Surgery Code Status: Full  Pt Overview and Major Events to Date:  2/6 Admitted  Assessment and Plan: Manuel Reyes is a 72 yo M admitted for SBO.   PMH pertinent for seizures, polycythemia, recent dark stools, HTN, DM2, dementia.    * Small bowel obstruction (HCC) Tolerated breakfast this AM. Denies abm pain, N/V, diarrhea. Having BM's. Abm benign on exam.  -  Gen surg consulted, appreciate recs - Advance diet as tolerated (now trialing Regular diet)  Enteritis Initially met SIRs criteria on admission, suspect enteritis leading to SBO developing.  - s/p 3 day course CTX and flagyl  Seizure-like activity ([place]) - Transition back to home PO keppra - Seizure precautions  Essential hypertension - Restarted home amlodipine, lisinopril, HCTZ  Chronic diastolic heart failure ([place]) Echo shows EF 90-38 and no diastolic dysfunction, improved from prior echo.    FEN/GI: Regular PPx: Lovenox Dispo:Home today. Barriers include ability to tolerate PO today.   Subjective:  Tolerating diet, not having N/V. Having BM/s. Denies abm tenderness.   Objective: Temp:  [97.8 F (36.6 C)-98.3 F (36.8 C)] 98 F (36.7 C) (02/11 0644) Pulse Rate:  [57-70] 57 (02/11 0644) Resp:  [15-18] 18 (02/11 0644) BP: (143-157)/(87-97) 157/97 (02/11 0644) SpO2:  [95 %-98 %] 95 % (02/11 0644) Physical Exam: General: Well-appearing, laying comfortably in bed. NAD. Cardiovascular: RRR Respiratory: CTAB, Normal WOB on RA. Abdomen: Soft, nontender. Distended. Normal BS.  Laboratory: Most recent CBC Lab Results  Component Value Date   WBC 7.2 12/09/2022   HGB 15.4 12/09/2022   HCT 44.4 12/09/2022   MCV 91.4 12/09/2022   PLT 138 (L) 12/09/2022   Most recent BMP     Latest Ref Rng & Units 12/10/2022   11:38 AM  BMP  Glucose 70 - 99 mg/dL 104   BUN 8 - 23 mg/dL 6   Creatinine 0.61 - 1.24 mg/dL 0.59   Sodium 135 - 145 mmol/L 134   Potassium 3.5 - 5.1 mmol/L 3.6   Chloride 98 - 111 mmol/L 102   CO2 22 - 32 mmol/L 23   Calcium 8.9 - 10.3 mg/dL 8.6     Arlyce Dice, MD 12/10/2022, 1:27 PM  PGY-1, [place] Intern pager: [phone], text pages welcome Secure chat group [place]

## 2022-12-11 ENCOUNTER — Encounter: Payer: Self-pay | Admitting: Gastroenterology

## 2022-12-11 ENCOUNTER — Telehealth: Payer: Self-pay

## 2022-12-11 ENCOUNTER — Ambulatory Visit (INDEPENDENT_AMBULATORY_CARE_PROVIDER_SITE_OTHER): Payer: Medicaid Other | Admitting: Gastroenterology

## 2022-12-11 VITALS — BP 130/76 | HR 83 | Ht 65.0 in | Wt 162.4 lb

## 2022-12-11 DIAGNOSIS — R195 Other fecal abnormalities: Secondary | ICD-10-CM

## 2022-12-11 DIAGNOSIS — R933 Abnormal findings on diagnostic imaging of other parts of digestive tract: Secondary | ICD-10-CM | POA: Diagnosis not present

## 2022-12-11 DIAGNOSIS — K56609 Unspecified intestinal obstruction, unspecified as to partial versus complete obstruction: Secondary | ICD-10-CM

## 2022-12-11 DIAGNOSIS — Z86010 Personal history of colonic polyps: Secondary | ICD-10-CM

## 2022-12-11 NOTE — Transitions of Care (Post Inpatient/ED Visit) (Unsigned)
   12/11/2022  Name: Manuel Reyes MRN: [id number] DOB: [birth year]  Today's TOC FU Call Status: Today's TOC FU Call Status:: Unsuccessul Call (1st Attempt) Unsuccessful Call (1st Attempt) Date: 12/11/22  Attempted to reach the patient regarding the most recent Inpatient/ED visit.  Follow Up Plan: {TOCFUUNSUCCESSFUL:29072}  Signature Juanda Crumble, [place] Direct Dial [phone]

## 2022-12-11 NOTE — Patient Instructions (Signed)
If your blood pressure at your visit was 140/90 or greater, please contact your primary care physician to follow up on this.  _______________________________________________________  If you are age 70 or older, your body mass index should be between 23-30. Your Body mass index is 27.02 kg/m. If this is out of the aforementioned range listed, please consider follow up with your Primary Care Provider.  If you are age 36 or younger, your body mass index should be between 19-25. Your Body mass index is 27.02 kg/m. If this is out of the aformentioned range listed, please consider follow up with your Primary Care Provider.   ________________________________________________________  The [place] [facility] providers would like to encourage you to use [facility] to communicate with providers for non-urgent requests or questions.  Due to long hold times on the telephone, sending your provider a message by [facility] may be a faster and more efficient way to get a response.  Please allow 48 business hours for a response.  Please remember that this is for non-urgent requests.  _______________________________________________________  Due to recent changes in healthcare laws, you may see the results of your imaging and laboratory studies on MyChart before your provider has had a chance to review them.  We understand that in some cases there may be results that are confusing or concerning to you. Not all laboratory results come back in the same time frame and the provider may be waiting for multiple results in order to interpret others.  Please give [place] 48 hours in order for your provider to thoroughly review all the results before contacting the office for clarification of your results.    You have been scheduled for a CT Enterography scan of the abdomen and pelvis at [facility], 1st floor Radiology. You are scheduled on Monday, April 8th at 10:00 am. You should arrive at 8:30 am for registration and to drink the  contrast  Please follow the written instructions below on the day of your exam:  1) Do not eat or drink anything after 6:00 am (4 hours prior to your test)   You may take any medications as prescribed with a small amount of water, if necessary. If you take any of the following medications: METFORMIN, GLUCOPHAGE, [place], AVANDAMET, RIOMET, FORTAMET, [place] MET, JANUMET, GLUMETZA or METAGLIP, you MAY be asked to HOLD this medication 48 hours AFTER the exam.   If you have any questions regarding your exam or if you need to reschedule, you may call [facility] at [phone] between the hours of 8:00 am and 5:00 pm, Monday-Friday.     You will be due for a recall colonoscopy in 12-2024. We will send you a reminder in the mail when it gets closer to that time.  We are giving you a Low Residual Diet today.  Thank you for entrusting me with your care and for choosing [facility], Dr. Puxico Cellar ]

## 2022-12-11 NOTE — Progress Notes (Signed)
HPI :  72 year old male who speaks Nepali, history of colon polyps, small bowel obstruction, altered bowel habits, referred back here by Sharman Crate Lilland DO for dark stools.  Have not seen him since March 2019.  He reports he was initially referred here for some dark stools concerning for blood in his stools that occurred perhaps a month ago.  He states stools were dark although he cannot recall if dark brown versus black.  He unfortunately was admitted to the hospital this past week.  He presented with fever, severe vomiting, and diarrhea that was rather acute onset.  He presented to the hospital and underwent a CT scan, which showed areas of inflammation of the small bowel along with a small bowel obstruction.  It was thought he may have had an infectious enteritis leading to small bowel obstruction.  He was treated with antibiotics, NG tube, managed conservatively, did not need surgery.  Over time his symptoms have improved.  He states when he was having the diarrhea his stools were brown, without blood.  He was actually noted to have polycythemia, no anemia.  BUN was normal.  He was discharged from the hospital actually just yesterday.  He is tolerating p.o., he has no abdominal pain.  His loose stools have stopped, he denies any dark stools at this time.  He was discharged on omeprazole 20 mg, he states he has occasional reflux in the past but not really bothering him currently.  He denied any use of iron in the past or Pepto-Bismol, or any changes in his diet to account for dark stools.  He denies any NSAID use.  His last colonoscopy was in 2019, a few small polyps removed, 1 of which was an adenoma.  Colonoscopy 12/28/2017:  - Two 2 to 3 mm polyps in the cecum, removed with a                            cold snare. Resected and retrieved.                           - One 4 mm polyp in the ascending colon, removed                            with a cold snare. Resected and retrieved.                            - Tortuous colon.                           - Internal hemorrhoids.                           - The examination was otherwise normal.   Surgical [P], cecum x 2, ascending x 1, polyp (3) - TUBULAR ADENOMA (X1 FRAGMENTS). - BENIGN COLONIC MUCOSA WITH LYMPHOID AGGREGATES (X2 FRAGMENTS). - NO HIGH GRADE DYSPLASIA OR MALIGNANCY.    CT scan 12/05/2022: IMPRESSION: 1. Findings are compatible with small bowel obstruction, with gradual transition to nondilated small bowel in the region of the jejunum or proximal ileum, where there are segments of the small bowel which appears thickened and inflamed. The overall appearance suggests potential enteritis (as described above), with mechanical obstruction less likely but not  entirely excluded. Surgical consultation is recommended. 2. Hepatic steatosis. 3. Aortic atherosclerosis. 4. Additional incidental findings, as above.    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 [place]- took entire prescribed course of medication     Past Surgical History:  Procedure Laterality Date   AMPUTATION Left 04/16/2016   Procedure: AMPUTATION DIGIT;  Surgeon: Iran Planas, MD;  Location: [facility];  Service: Orthopedics;  Laterality: Left;   NO PAST SURGERIES     Family History  Problem Relation Age of Onset   Seizures Neg Hx    Dementia Neg Hx    Colon cancer  Neg Hx    Esophageal cancer Neg Hx    Rectal cancer Neg Hx    Stomach cancer Neg Hx    Colon polyps Neg Hx    Social History   Tobacco Use   Smoking status: Never   Smokeless tobacco: Never  Vaping Use   Vaping Use: Never used  Substance Use Topics   Alcohol use: Yes    Alcohol/week: 1.0 standard drink of alcohol    Types: 1 Cans of beer per week    Comment: Beer ocass   Drug use: No   Current Outpatient Medications  Medication Sig Dispense Refill   cyclobenzaprine (FLEXERIL) 5 MG tablet Take 5 mg by mouth 3 (three) times daily as needed.     donepezil (ARICEPT) 10 MG tablet Take 1 tablet (10 mg total) by mouth at bedtime. 90 tablet 1   gabapentin (NEURONTIN) 300 MG capsule Take 300 mg by mouth daily.     levETIRAcetam (KEPPRA) 500 MG tablet TAKE ONE TABLET BY MOUTH TWICE DAILY 62 tablet 11   lisinopril-hydrochlorothiazide (ZESTORETIC) 20-25 MG tablet Take 1 tablet by mouth daily. 90 tablet 0   memantine (NAMENDA) 5 MG tablet TAKE ONE TABLET BY MOUTH TWICE DAILY 62 tablet 11   omeprazole (PRILOSEC) 20 MG capsule TAKE ONE CAPSULE BY MOUTH EVERY DAY 93 capsule 0   rosuvastatin (CRESTOR) 40 MG tablet Take 1 tablet (40 mg total) by mouth daily. 90 tablet 3   SENNA PLUS 8.6-50 MG tablet TAKE ONE TABLET BY MOUTH EVERY DAY 31 tablet 1   tamsulosin (FLOMAX) 0.4 MG CAPS capsule Take 1 capsule (0.4 mg total) by mouth daily. 90 capsule 0   venlafaxine XR (EFFEXOR-XR) 37.5 MG 24 hr capsule TAKE ONE CAPSULE BY MOUTH IN THE MORNING (WITH BREAKFAST) 31 capsule 5   No current facility-administered medications for this visit.   No Known Allergies   Review of Systems: All systems reviewed and negative except where noted in HPI.    Lab Results  Component Value Date   WBC 7.2 12/09/2022   HGB 15.4 12/09/2022   HCT 44.4 12/09/2022   MCV 91.4 12/09/2022   PLT 138 (L)  12/09/2022      Latest Ref Rng & Units 12/09/2022    2:34 AM 12/08/2022    4:13 AM 12/07/2022    3:34 AM  CBC  WBC 4.0 -  10.5 K/uL 7.2  5.5  4.9   Hemoglobin 13.0 - 17.0 g/dL 15.4  15.0  15.9   Hematocrit 39.0 - 52.0 % 44.4  45.0  46.9   Platelets 150 - 400 K/uL 138  129  130     Lab Results  Component Value Date   CREATININE 0.59 (L) 12/10/2022   BUN 6 (L) 12/10/2022   NA 134 (L) 12/10/2022   K 3.6 12/10/2022   CL 102 12/10/2022   CO2 23 12/10/2022    Lab Results  Component Value Date   ALT 19 12/06/2022   AST 24 12/06/2022   ALKPHOS 52 12/06/2022   BILITOT 0.8 12/06/2022     Physical Exam: BP 130/76   Pulse 83   Ht 5' 5"$  (1.651 m)   Wt 162 lb 6.4 oz (73.7 kg)   SpO2 96%   BMI 27.02 kg/m  Constitutional: Pleasant,well-developed, male in no acute distress. HEENT: Normocephalic and atraumatic. Conjunctivae are normal. No scleral icterus. Neck supple.  Cardiovascular: Normal rate, regular rhythm.  Pulmonary/chest: Effort normal and breath sounds normal.  Abdominal: Soft, nondistended, nontender. . There are no masses palpable. Extremities: no edema Lymphadenopathy: No cervical adenopathy noted. Neurological: Alert and oriented to person place and time. Skin: Skin is warm and dry. No rashes noted. Psychiatric: Normal mood and affect. Behavior is normal.   ASSESSMENT: 72 y.o. male here for assessment of the following  1. Abnormal CT scan, gastrointestinal tract   2. SBO (small bowel obstruction) (HCC)   3. Dark stools   4. History of colon polyps    As above, patient admitted with what sounds like an infectious enteritis with inflammatory changes in the small intestine, which led to SBO.  He had significant diarrhea, fever, vomiting and abdominal pain which led to his presentation.  Manage conservatively, in the hospital a few days, did not require operation and is clinically improved.  Counseled him on a low residual diet which he will stay on for the next several days.  He did not have stool studies sent, although infectious etiology remains most likely.  He does report a history  of a small bowel obstruction years ago.  Discussed options of doing a CT enterography in 6 to 8 weeks to reassess his bowel make sure no chronic inflammatory changes or concerning lesions there.  He is agreeable to this and we will coordinate that for him.  He was actually initially referred to the office for dark stools which she says was going on for 1 week.  Unclear if he truly had black stools, although it sounds atypical for bleeding.  Throughout his recent course with diarrhea he was passing brown stool.  Unclear if this was this dietary ingestion or not, he has polycythemia, no anemia.  He will keep an eye on this and if he has recurrent dark stools he will let me know and we can send for Hemoccult but for now would monitor.  He should follow-up with his primary care for polycythemia.  He is not due for colonoscopy until March 2026 based on updated surveillance guidelines for polyps.  PLAN: - low residual diet for next several days - will plan on repeat CT enterography in 6-8 weeks - monitor stools. Contact me if dark stools recur, holding off  on EGD for now, seems unlikely he had GI bleeding - follow up with PCP for polycythemia - avoid NSAIDs - change recall colonoscopy to 12/2024  Jolly Mango, MD [facility]  CC: Rise Patience, DO

## 2022-12-12 NOTE — Transitions of Care (Post Inpatient/ED Visit) (Signed)
   12/12/2022  Name: Manuel Reyes MRN: [id number] DOB: 1951/07/25  Today's TOC FU Call Status: Today's TOC FU Call Status:: Successful TOC FU Call Competed Unsuccessful Call (1st Attempt) Date: 12/11/22 [facility] FU Call Complete Date: 12/12/22  Transition Care Management Follow-up Telephone Call Date of Discharge: 12/10/22 Type of Discharge: Inpatient Admission Primary Inpatient Discharge Diagnosis:: respiratory failure How have you been since you were released from the hospital?: Better Any questions or concerns?: No  Items Reviewed: Did you receive and understand the discharge instructions provided?: Yes Medications obtained and verified?: Yes (Medications Reviewed) Any new allergies since your discharge?: No Dietary orders reviewed?: NA Do you have support at home?: Yes People in Home: child(ren), dependent  Home Care and Equipment/Supplies: [place] Ordered?: NA Any new equipment or medical supplies ordered?: NA  Functional Questionnaire: Do you need assistance with bathing/showering or dressing?: No Do you need assistance with meal preparation?: No Do you need assistance with eating?: No Do you have difficulty maintaining continence: No Do you need assistance with getting out of bed/getting out of a chair/moving?: No Do you have difficulty managing or taking your medications?: No  Folllow up appointments reviewed: PCP Follow-up appointment confirmed?: Yes Date of PCP follow-up appointment?: 12/15/22 Follow-up Provider: [facility] Follow-up appointment confirmed?: Yes Date of Specialist follow-up appointment?: 12/11/22 Follow-Up Specialty Provider:: Dr Havery Moros Do you need transportation to your follow-up appointment?: No Do you understand care options if your condition(s) worsen?: Yes-patient verbalized understanding    [place], [place] Nurse Health Advisor Direct Dial [phone]

## 2022-12-15 ENCOUNTER — Other Ambulatory Visit: Payer: Self-pay

## 2022-12-15 ENCOUNTER — Encounter: Payer: Self-pay | Admitting: Student

## 2022-12-15 ENCOUNTER — Ambulatory Visit: Payer: Medicaid Other | Admitting: Student

## 2022-12-15 VITALS — BP 126/78 | HR 84 | Ht 65.0 in | Wt 161.4 lb

## 2022-12-15 DIAGNOSIS — D751 Secondary polycythemia: Secondary | ICD-10-CM | POA: Diagnosis not present

## 2022-12-15 DIAGNOSIS — I10 Essential (primary) hypertension: Secondary | ICD-10-CM | POA: Diagnosis present

## 2022-12-15 DIAGNOSIS — K56609 Unspecified intestinal obstruction, unspecified as to partial versus complete obstruction: Secondary | ICD-10-CM | POA: Diagnosis not present

## 2022-12-15 MED ORDER — AMLODIPINE-OLMESARTAN 5-20 MG PO TABS: 1.0000 | ORAL_TABLET | Freq: Every day | ORAL | 2 refills | Status: DC

## 2022-12-15 NOTE — Progress Notes (Unsigned)
    SUBJECTIVE:   CHIEF COMPLAINT / HPI:   Hospital Follow-up Recent admit for SBO, resolved with conservative management, no need for surgical intervention. Feeling back to normal now. Normal BMs x past four days or so. Per D/c smmy, recommendations as below:  1) Chronic polycythemia-- Needs sleep study outpt. 2) Held Amlodipine (due to NPO status) and restart home Zestoretic at time of discharge. Restart home amlodipine if able to tolerate at PCP follow-up. 3) Home gabapentin was not restarted (clarify if he was truly getting this filled and taking it). 4) Recheck CBC, BMP at follow up visit  Polycythemia Present for a long time. Willing to pursue sleep study.  Snoring history - No  Tired during day - No  Observed apneas/choking - No  Pressure being treated - Yes  BMI > 35 kg/m2 - No  Age > 50 years - Yes  Neck Circumference > 40 cm - Yes  (42) Male Gender - Yes   HTN Misunderstood d/c instructions. Hasn't been taking anything at home.   OBJECTIVE:   BP 126/78   Pulse 84   Ht 5' 5"$  (1.651 m)   Wt 161 lb 6.4 oz (73.2 kg)   SpO2 97%   BMI 26.86 kg/m   Physical Exam Vitals reviewed.  Constitutional:      General: He is not in acute distress. HENT:     Mouth/Throat:     Mouth: Mucous membranes are moist.  Cardiovascular:     Rate and Rhythm: Normal rate and regular rhythm.     Heart sounds: No murmur heard. Pulmonary:     Effort: Pulmonary effort is normal. No respiratory distress.     Breath sounds: No wheezing.  Abdominal:     General: Abdomen is flat. There is no distension.     Tenderness: There is no abdominal tenderness.  Musculoskeletal:        General: No swelling or deformity.  Skin:    General: Skin is warm and dry.     Findings: No rash.      ASSESSMENT/PLAN:   Polycythemia Smear and diff in hospital were non-revealing. Taken together with STOP-BANG score of 4, high suspicion for OSA. Will send for sleep study, patient has strong preference for  home sleep study.  - Repeat CBC today  Small bowel obstruction (HCC) Resolved. Back to baseline without lingering symptoms.  - Repeat BMP today  Essential hypertension Well-controlled off all agents.  - Continue care without medication at this time      Marnee Guarneri, MD [place]

## 2022-12-15 NOTE — Patient Instructions (Addendum)
  I am ordering a sleep study. The sleep center at [facility] will call you to set this up.  We are getting some labs today. I will call you if we need to make any changes. I will MyChart you if things look normal.   Be sure that you re-start your seizure medicine.   Marnee Guarneri, MD

## 2022-12-16 LAB — CBC
Hematocrit: 50.9 % (ref 37.5–51.0)
Hemoglobin: 17.4 g/dL (ref 13.0–17.7)
MCH: 31.6 pg (ref 26.6–33.0)
MCHC: 34.2 g/dL (ref 31.5–35.7)
MCV: 92 fL (ref 79–97)
Platelets: 238 10*3/uL (ref 150–450)
RBC: 5.51 x10E6/uL (ref 4.14–5.80)
RDW: 12.7 % (ref 11.6–15.4)
WBC: 6.8 10*3/uL (ref 3.4–10.8)

## 2022-12-16 LAB — BASIC METABOLIC PANEL
BUN/Creatinine Ratio: 13 (ref 10–24)
BUN: 8 mg/dL (ref 8–27)
CO2: 24 mmol/L (ref 20–29)
Calcium: 9.1 mg/dL (ref 8.6–10.2)
Chloride: 102 mmol/L (ref 96–106)
Creatinine, Ser: 0.64 mg/dL — ABNORMAL LOW (ref 0.76–1.27)
Glucose: 112 mg/dL — ABNORMAL HIGH (ref 70–99)
Potassium: 4.5 mmol/L (ref 3.5–5.2)
Sodium: 142 mmol/L (ref 134–144)
eGFR: 102 mL/min/{1.73_m2} (ref >59)

## 2022-12-17 NOTE — Assessment & Plan Note (Addendum)
Smear and diff in hospital were non-revealing. Taken together with STOP-BANG score of 4, high suspicion for OSA. Will send for sleep study, patient has strong preference for home sleep study.  - Repeat CBC today

## 2022-12-17 NOTE — Assessment & Plan Note (Signed)
Resolved. Back to baseline without lingering symptoms.  - Repeat BMP today

## 2022-12-17 NOTE — Assessment & Plan Note (Signed)
Well-controlled off all agents.  - Continue care without medication at this time

## 2022-12-28 ENCOUNTER — Encounter: Payer: Self-pay | Admitting: Gastroenterology

## 2023-01-04 ENCOUNTER — Other Ambulatory Visit: Payer: Self-pay | Admitting: Student

## 2023-01-04 DIAGNOSIS — I10 Essential (primary) hypertension: Secondary | ICD-10-CM

## 2023-01-06 ENCOUNTER — Other Ambulatory Visit: Payer: Self-pay | Admitting: Student

## 2023-01-06 DIAGNOSIS — I10 Essential (primary) hypertension: Secondary | ICD-10-CM

## 2023-01-09 ENCOUNTER — Other Ambulatory Visit: Payer: Self-pay | Admitting: Student

## 2023-01-09 DIAGNOSIS — I10 Essential (primary) hypertension: Secondary | ICD-10-CM

## 2023-01-10 ENCOUNTER — Other Ambulatory Visit: Payer: Self-pay | Admitting: Student

## 2023-01-10 DIAGNOSIS — I10 Essential (primary) hypertension: Secondary | ICD-10-CM

## 2023-01-23 ENCOUNTER — Telehealth: Payer: Self-pay | Admitting: Student

## 2023-01-23 NOTE — Telephone Encounter (Signed)
patient dropped off form at front desk for CAP/DA.  Verified that patient section of form has been completed.  Last DOS/WCC with PCP was 12/15/22.  Placed form in red team folder to be completed by clinical staff.  Creig Hines

## 2023-01-25 NOTE — Telephone Encounter (Signed)
Placed in MDs box to be filled out. Manuel Reyes, CMA  

## 2023-01-30 ENCOUNTER — Other Ambulatory Visit: Payer: Self-pay | Admitting: *Deleted

## 2023-01-30 DIAGNOSIS — N401 Enlarged prostate with lower urinary tract symptoms: Secondary | ICD-10-CM

## 2023-01-30 MED ORDER — TAMSULOSIN HCL 0.4 MG PO CAPS: 0.4000 mg | ORAL_CAPSULE | Freq: Every day | ORAL | 0 refills | Status: DC

## 2023-01-30 NOTE — Telephone Encounter (Signed)
Patient's son called and informed that forms are ready for pick up. Copy made and placed in batch scanning. Original placed at front desk for pick up.   Karlynn Furrow C Cici Rodriges, RN  

## 2023-02-05 ENCOUNTER — Ambulatory Visit (HOSPITAL_COMMUNITY)
Admission: RE | Admit: 2023-02-05 | Discharge: 2023-02-05 | Disposition: A | Discharge status: Home / Self Care | Payer: Medicaid Other | Source: Ambulatory Visit | Attending: Gastroenterology | Admitting: Gastroenterology

## 2023-02-05 ENCOUNTER — Other Ambulatory Visit: Payer: Self-pay | Admitting: Student

## 2023-02-05 DIAGNOSIS — R933 Abnormal findings on diagnostic imaging of other parts of digestive tract: Secondary | ICD-10-CM | POA: Insufficient documentation

## 2023-02-05 DIAGNOSIS — I10 Essential (primary) hypertension: Secondary | ICD-10-CM

## 2023-02-05 MED ORDER — BARIUM SULFATE 0.1 % PO SUSP
ORAL | Status: AC
  Filled 2023-02-05: qty 3

## 2023-02-05 MED ORDER — IOHEXOL 300 MG/ML  SOLN
100.0000 mL | Freq: Once | INTRAMUSCULAR | Status: AC | PRN
  Administered 2023-02-05: 100 mL via INTRAVENOUS

## 2023-02-08 NOTE — Telephone Encounter (Signed)
Received call from pharmacy. They never received prescription for Lisinopril-HCTZ. Last fill was 10/04/22. Please advise.   Veronda Prude, RN

## 2023-02-08 NOTE — Telephone Encounter (Signed)
Called pharmacy and advised that medication has been discontinued.   Veronda Prude, RN

## 2023-03-07 ENCOUNTER — Other Ambulatory Visit: Payer: Self-pay | Admitting: Student

## 2023-03-07 DIAGNOSIS — K2100 Gastro-esophageal reflux disease with esophagitis, without bleeding: Secondary | ICD-10-CM

## 2023-05-05 ENCOUNTER — Other Ambulatory Visit: Payer: Self-pay | Admitting: Student

## 2023-05-05 DIAGNOSIS — N401 Enlarged prostate with lower urinary tract symptoms: Secondary | ICD-10-CM

## 2023-05-07 ENCOUNTER — Other Ambulatory Visit: Payer: Self-pay | Admitting: *Deleted

## 2023-05-07 DIAGNOSIS — N401 Enlarged prostate with lower urinary tract symptoms: Secondary | ICD-10-CM

## 2023-06-06 ENCOUNTER — Encounter: Payer: Self-pay | Admitting: Student

## 2023-06-06 ENCOUNTER — Other Ambulatory Visit: Payer: Self-pay

## 2023-06-06 ENCOUNTER — Ambulatory Visit (INDEPENDENT_AMBULATORY_CARE_PROVIDER_SITE_OTHER): Payer: Medicaid Other | Admitting: Student

## 2023-06-06 VITALS — BP 150/80 | HR 97 | Ht 65.0 in | Wt 163.4 lb

## 2023-06-06 DIAGNOSIS — F0390 Unspecified dementia without behavioral disturbance: Secondary | ICD-10-CM

## 2023-06-06 DIAGNOSIS — R350 Frequency of micturition: Secondary | ICD-10-CM

## 2023-06-06 DIAGNOSIS — R569 Unspecified convulsions: Secondary | ICD-10-CM

## 2023-06-06 DIAGNOSIS — I10 Essential (primary) hypertension: Secondary | ICD-10-CM

## 2023-06-06 DIAGNOSIS — G629 Polyneuropathy, unspecified: Secondary | ICD-10-CM | POA: Diagnosis not present

## 2023-06-06 DIAGNOSIS — K5909 Other constipation: Secondary | ICD-10-CM

## 2023-06-06 DIAGNOSIS — F411 Generalized anxiety disorder: Secondary | ICD-10-CM

## 2023-06-06 DIAGNOSIS — D751 Secondary polycythemia: Secondary | ICD-10-CM

## 2023-06-06 DIAGNOSIS — E785 Hyperlipidemia, unspecified: Secondary | ICD-10-CM

## 2023-06-06 DIAGNOSIS — N401 Enlarged prostate with lower urinary tract symptoms: Secondary | ICD-10-CM

## 2023-06-06 MED ORDER — LEVETIRACETAM 500 MG PO TABS: 500.0000 mg | ORAL_TABLET | Freq: Two times a day (BID) | ORAL | 11 refills | Status: DC

## 2023-06-06 MED ORDER — ROSUVASTATIN CALCIUM 40 MG PO TABS: 40.0000 mg | ORAL_TABLET | Freq: Every day | ORAL | 3 refills | Status: DC

## 2023-06-06 MED ORDER — VENLAFAXINE HCL ER 37.5 MG PO CP24: ORAL_CAPSULE | ORAL | 5 refills | Status: DC

## 2023-06-06 MED ORDER — TAMSULOSIN HCL 0.4 MG PO CAPS: 0.4000 mg | ORAL_CAPSULE | Freq: Every day | ORAL | 0 refills | Status: DC

## 2023-06-06 MED ORDER — AMLODIPINE-OLMESARTAN 5-20 MG PO TABS
1.0000 | ORAL_TABLET | Freq: Every day | ORAL | 3 refills | Status: DC
Start: 2023-06-06 — End: 2024-01-07

## 2023-06-06 MED ORDER — GABAPENTIN 300 MG PO CAPS: 300.0000 mg | ORAL_CAPSULE | Freq: Every day | ORAL | 1 refills | Status: DC

## 2023-06-06 MED ORDER — DONEPEZIL HCL 10 MG PO TABS
10.0000 mg | ORAL_TABLET | Freq: Every day | ORAL | 1 refills | Status: DC
Start: 2023-06-06 — End: 2024-01-07

## 2023-06-06 MED ORDER — MEMANTINE HCL 5 MG PO TABS
5.0000 mg | ORAL_TABLET | Freq: Two times a day (BID) | ORAL | 11 refills | Status: DC
Start: 2023-06-06 — End: 2024-01-07

## 2023-06-06 MED ORDER — SENNOSIDES-DOCUSATE SODIUM 8.6-50 MG PO TABS
1.0000 | ORAL_TABLET | Freq: Every evening | ORAL | 1 refills | Status: DC | PRN
Start: 2023-06-06 — End: 2024-01-07

## 2023-06-06 NOTE — Patient Instructions (Addendum)
We will have the sleep center reach out regarding your sleep study. I suspect that treating your sleep will help with the blood pressure as well.  I'm re-starting your BP medicines, this will be one pill that contains two combined meds: amlodipine and olmesartan.   I am refilling the rest of your meds.  Come back to see me in 1-2 months for a BP check on this new medicine.   Eliezer Mccoy, MD

## 2023-06-06 NOTE — Progress Notes (Signed)
    SUBJECTIVE:   CHIEF COMPLAINT / HPI:   Polycythemia Here for follow-up. Sleep study ordered at last visit due to Polycythemia and STOPBANG of 4, but this has not happened yet. They never heard back from the sleep center. Denies itching or tinnitus.  Snoring history - No  Tired during day - Yes  Observed apneas/choking - No  Pressure being treated - Yes  BMI > 35 kg/m2 - No  Age > 50 years - Yes  Neck Circumference > 40 cm - Yes  (42cm) Male Gender - Yes    Hypertension Had previously been doing well off of all medications. However, BP elevated x2 in clinic today.    No acute complaints today otherwise, requesting refills of other medications.     OBJECTIVE:   BP (!) 150/80   Pulse 97   Ht 5\' 5"  (1.651 m)   Wt 163 lb 6.4 oz (74.1 kg)   SpO2 93%   BMI 27.19 kg/m   Physical Exam Vitals reviewed.  Constitutional:      General: He is not in acute distress. Cardiovascular:     Heart sounds: No murmur heard. Pulmonary:     Effort: Pulmonary effort is normal. No respiratory distress.  Musculoskeletal:        General: No swelling or deformity.  Skin:    General: Skin is warm and dry.     Findings: No rash.  Neurological:     Comments: At neurologic baseline, has cognitive impairment/dementia, son does most of the talking      ASSESSMENT/PLAN:   Polycythemia Previously had a smear and differential in the hospital. Hgb actually WNL at last visit. STOP-BANG of 5 today. Taken together with his hypertension, I remain with suspicion for underlying OSA. - Repeat CBC today - Re-referred for sleep study. Given language barrier might be easier to have this done at [facility]    Essential hypertension Unfortunately above goal x2 today. Had previously been off all meds due to adequate control. - Will re-start amlodipine-olmesartan 5-20mg  daily - Return in 2 weeks for BP Follow-up      J Dorothyann Gibbs, MD [facility]

## 2023-06-08 NOTE — Assessment & Plan Note (Signed)
Previously had a smear and differential in the hospital. Hgb actually WNL at last visit. STOP-BANG of 5 today. Taken together with his hypertension, I remain with suspicion for underlying OSA. - Repeat CBC today - Re-referred for sleep study. Given language barrier might be easier to have this done at [facility]

## 2023-06-08 NOTE — Assessment & Plan Note (Signed)
Unfortunately above goal x2 today. Had previously been off all meds due to adequate control. - Will re-start amlodipine-olmesartan 5-20mg  daily - Return in 2 weeks for BP Follow-up

## 2023-08-06 ENCOUNTER — Ambulatory Visit (HOSPITAL_BASED_OUTPATIENT_CLINIC_OR_DEPARTMENT_OTHER): Payer: Medicaid Other | Attending: Family Medicine | Admitting: Internal Medicine

## 2023-09-03 ENCOUNTER — Other Ambulatory Visit: Payer: Self-pay | Admitting: Student

## 2023-09-03 DIAGNOSIS — N401 Enlarged prostate with lower urinary tract symptoms: Secondary | ICD-10-CM

## 2024-01-07 ENCOUNTER — Encounter: Payer: Self-pay | Admitting: Family Medicine

## 2024-01-07 ENCOUNTER — Ambulatory Visit (INDEPENDENT_AMBULATORY_CARE_PROVIDER_SITE_OTHER): Payer: Medicare Other | Admitting: Family Medicine

## 2024-01-07 VITALS — BP 185/112 | HR 84 | Ht 65.0 in | Wt 161.4 lb

## 2024-01-07 DIAGNOSIS — R569 Unspecified convulsions: Secondary | ICD-10-CM | POA: Diagnosis not present

## 2024-01-07 DIAGNOSIS — D751 Secondary polycythemia: Secondary | ICD-10-CM

## 2024-01-07 DIAGNOSIS — N401 Enlarged prostate with lower urinary tract symptoms: Secondary | ICD-10-CM

## 2024-01-07 DIAGNOSIS — G629 Polyneuropathy, unspecified: Secondary | ICD-10-CM

## 2024-01-07 DIAGNOSIS — F0390 Unspecified dementia without behavioral disturbance: Secondary | ICD-10-CM

## 2024-01-07 DIAGNOSIS — K5909 Other constipation: Secondary | ICD-10-CM | POA: Diagnosis not present

## 2024-01-07 DIAGNOSIS — E785 Hyperlipidemia, unspecified: Secondary | ICD-10-CM | POA: Diagnosis not present

## 2024-01-07 DIAGNOSIS — I10 Essential (primary) hypertension: Secondary | ICD-10-CM | POA: Diagnosis not present

## 2024-01-07 DIAGNOSIS — F411 Generalized anxiety disorder: Secondary | ICD-10-CM

## 2024-01-07 DIAGNOSIS — Z23 Encounter for immunization: Secondary | ICD-10-CM

## 2024-01-07 DIAGNOSIS — R350 Frequency of micturition: Secondary | ICD-10-CM

## 2024-01-07 MED ORDER — GABAPENTIN 300 MG PO CAPS
300.0000 mg | ORAL_CAPSULE | Freq: Every day | ORAL | 1 refills | Status: DC
Start: 2024-01-07 — End: 2024-06-12

## 2024-01-07 MED ORDER — VENLAFAXINE HCL ER 37.5 MG PO CP24: ORAL_CAPSULE | ORAL | 5 refills | Status: DC

## 2024-01-07 MED ORDER — DONEPEZIL HCL 10 MG PO TABS: 10.0000 mg | ORAL_TABLET | Freq: Every day | ORAL | 1 refills | Status: DC

## 2024-01-07 MED ORDER — MEMANTINE HCL 5 MG PO TABS: 5.0000 mg | ORAL_TABLET | Freq: Two times a day (BID) | ORAL | 11 refills | Status: DC

## 2024-01-07 MED ORDER — TAMSULOSIN HCL 0.4 MG PO CAPS: 0.4000 mg | ORAL_CAPSULE | Freq: Every day | ORAL | 0 refills | Status: DC

## 2024-01-07 MED ORDER — LEVETIRACETAM 500 MG PO TABS
500.0000 mg | ORAL_TABLET | Freq: Two times a day (BID) | ORAL | 11 refills | Status: DC
Start: 2024-01-07 — End: 2024-06-12

## 2024-01-07 MED ORDER — AMLODIPINE-OLMESARTAN 5-20 MG PO TABS: 1.0000 | ORAL_TABLET | Freq: Every day | ORAL | 3 refills | Status: DC

## 2024-01-07 MED ORDER — AMLODIPINE-OLMESARTAN 5-40 MG PO TABS
1.0000 | ORAL_TABLET | Freq: Every day | ORAL | 0 refills | Status: DC
Start: 2024-01-07 — End: 2024-02-04

## 2024-01-07 MED ORDER — ROSUVASTATIN CALCIUM 40 MG PO TABS: 40.0000 mg | ORAL_TABLET | Freq: Every day | ORAL | 3 refills | Status: DC

## 2024-01-07 MED ORDER — SENNOSIDES-DOCUSATE SODIUM 8.6-50 MG PO TABS
1.0000 | ORAL_TABLET | Freq: Every evening | ORAL | 2 refills | Status: DC | PRN
Start: 2024-01-07 — End: 2024-06-12

## 2024-01-07 NOTE — Assessment & Plan Note (Signed)
 Rechecking CBC today. Will order amb referral to pulmonology for evaluation of OSA. Patient declines sleep study.

## 2024-01-07 NOTE — Assessment & Plan Note (Addendum)
 BP elevated today on both checks. Will increase Azor from 5-20 to 5-40mg . Will follow up in 2 weeks.

## 2024-01-07 NOTE — Progress Notes (Signed)
    SUBJECTIVE:   CHIEF COMPLAINT / HPI:   Son used as Equities trader.   Patient is here for follow up of polycythemia and HTN.   Polycythemia: Previous smear WNL. CBC prior also wnl. Concerned for sleep apnea. Referral sent for sleep study on 8/24. Son reports patient is not likely to do this because of his dementia.   Concerns for constipation: Has not tried any medications in the past. Last stool was yesterday but hard. Denies any blood in stool.   HTN: Did not take medications this morning but has been taking it daily. Denies headaches  PERTINENT  PMH / PSH:  HTN Polycythemia Chronic constipation   OBJECTIVE:   BP (!) 185/112   Pulse 84   Ht 5\' 5"  (1.651 m)   Wt 161 lb 6 oz (73.2 kg)   SpO2 94%   BMI 26.85 kg/m   General: A&O, NAD HEENT: No sign of trauma, EOM grossly intact Cardiac: RRR, no m/r/g Respiratory: CTAB, normal WOB, no w/c/r GI: Soft, NTTP, non-distended  Extremities: NTTP, no peripheral edema. Neuro: Normal gait, moves all four extremities appropriately. Psych: Appropriate mood and affect  ASSESSMENT/PLAN:   Essential hypertension BP elevated today on both checks. Will increase Azor from 5-20 to 5-40mg . Will follow up in 2 weeks.   Polycythemia Rechecking CBC today. Will order amb referral to pulmonology for evaluation of OSA. Patient declines sleep study.   Chronic constipation Has not been taking anything. Last stool was yesterday, which is reassuring. Reordered senna, discussed increasing fiber in diet with son    Hal Morales, MD [facility]

## 2024-01-07 NOTE — Assessment & Plan Note (Signed)
 Has not been taking anything. Last stool was yesterday, which is reassuring. Reordered senna, discussed increasing fiber in diet with son

## 2024-01-07 NOTE — Patient Instructions (Signed)
 It was great to see you today! Thank you for choosing [facility] for your primary care. Manuel Reyes was seen for follow up.  Today we addressed: Polycythemia (high red blood levels): I am rechecking your blood level today. I also ordered a referral to see a sleep specialist. Your blood pressure was high today, I increased your medication. Please schedule a follow up for your blood pressure in two weeks.   If you haven't already, sign up for My Chart to have easy access to your labs results, and communication with your primary care physician.  We are checking some labs today. If they are abnormal, I will call you. If they are normal, I will send you a MyChart message (if it is active) or a letter in the mail. If you do not hear about your labs in the next 2 weeks, please call the office.   You should return to our clinic Return in about 2 weeks (around 01/21/2024).  I recommend that you always bring your medications to each appointment as this makes it easy to ensure you are on the correct medications and helps [place] not miss refills when you need them.  Please arrive 15 minutes before your appointment to ensure smooth check in process.  We appreciate your efforts in making this happen.  Please call the clinic at [phone] if your symptoms worsen or you have any concerns.  Thank you for allowing me to participate in your care, Hal Morales, MD 01/07/2024, 10:45 AM PGY-1, [facility]

## 2024-01-08 LAB — CBC
Hematocrit: 54.3 % — ABNORMAL HIGH (ref 37.5–51.0)
Hemoglobin: 18.4 g/dL — ABNORMAL HIGH (ref 13.0–17.7)
MCH: 31.2 pg (ref 26.6–33.0)
MCHC: 33.9 g/dL (ref 31.5–35.7)
MCV: 92 fL (ref 79–97)
Platelets: 196 10*3/uL (ref 150–450)
RBC: 5.9 x10E6/uL — ABNORMAL HIGH (ref 4.14–5.80)
RDW: 13 % (ref 11.6–15.4)
WBC: 5.5 10*3/uL (ref 3.4–10.8)

## 2024-01-10 ENCOUNTER — Telehealth: Payer: Self-pay | Admitting: Family Medicine

## 2024-01-10 DIAGNOSIS — D751 Secondary polycythemia: Secondary | ICD-10-CM

## 2024-01-10 NOTE — Telephone Encounter (Signed)
 Spoke to son regarding Hg being elevated. Although son endorses patient will not follow up with sleep study due to dementia, discussed referral to hematology for further management. All questions answered. Will place referral to hematology.

## 2024-01-31 ENCOUNTER — Encounter: Payer: Self-pay | Admitting: Family Medicine

## 2024-01-31 ENCOUNTER — Ambulatory Visit (INDEPENDENT_AMBULATORY_CARE_PROVIDER_SITE_OTHER): Admitting: Family Medicine

## 2024-01-31 VITALS — BP 119/82 | HR 91 | Ht 65.0 in | Wt 158.2 lb

## 2024-01-31 DIAGNOSIS — I10 Essential (primary) hypertension: Secondary | ICD-10-CM

## 2024-01-31 NOTE — Patient Instructions (Signed)
 It was great to see you today! Thank you for choosing [facility] for your primary care. Manuel Reyes was seen for follow up.  Today we addressed: Your blood pressure looks great! We are checking labs since we changed your medication Please wait to here for hematology regarding an appointment   If you haven't already, sign up for My Chart to have easy access to your labs results, and communication with your primary care physician.  We are checking some labs today. If they are abnormal, I will call you. If they are normal, I will send you a MyChart message (if it is active) or a letter in the mail. If you do not hear about your labs in the next 2 weeks, please call the office.   You should return to our clinic Return in about 3 months (around 05/01/2024).  I recommend that you always bring your medications to each appointment as this makes it easy to ensure you are on the correct medications and helps [place] not miss refills when you need them.  Please arrive 15 minutes before your appointment to ensure smooth check in process.  We appreciate your efforts in making this happen.  Please call the clinic at [phone] if your symptoms worsen or you have any concerns.  Thank you for allowing me to participate in your care, Hal Morales, MD 01/31/2024, 4:15 PM PGY-1, [facility]

## 2024-01-31 NOTE — Progress Notes (Signed)
    SUBJECTIVE:   CHIEF COMPLAINT / HPI:   Patient here for follow-up on blood pressure.  Adjusted blood pressure medication at last visit.  Son reports that he has been taking blood pressure medication and been doing well.  Discussed hematology referral, son reports he has not heard from them yet.  Reports constipation is better since starting senna  PERTINENT  PMH / PSH:  Polycythemia Hypertension Chronic diastolic heart failure   OBJECTIVE:   BP 119/82   Pulse 91   Ht 5\' 5"  (1.651 m)   Wt 158 lb 3.2 oz (71.8 kg)   SpO2 91%   BMI 26.33 kg/m   General: A&O, NAD HEENT: No sign of trauma, EOM grossly intact Cardiac: RRR, no m/r/g Respiratory: CTAB, normal WOB, no w/c/r GI: Soft, NTTP, non-distended  Extremities: NTTP, no peripheral edema. Neuro: Normal gait, moves all four extremities appropriately. Psych: Appropriate mood and affect   ASSESSMENT/PLAN:   Assessment & Plan Essential hypertension Blood pressure much improved from last visit.  Continue Azor 5 mg - 40 mg daily.  Will check BMP today as increased dose at last visit.   3 month follow up Hal Morales, MD [facility]

## 2024-01-31 NOTE — Assessment & Plan Note (Signed)
 Blood pressure much improved from last visit.  Continue Azor 5 mg - 40 mg daily.  Will check BMP today as increased dose at last visit.

## 2024-02-01 LAB — BASIC METABOLIC PANEL WITH GFR
BUN/Creatinine Ratio: 13 (ref 10–24)
BUN: 15 mg/dL (ref 8–27)
CO2: 24 mmol/L (ref 20–29)
Calcium: 10 mg/dL (ref 8.6–10.2)
Chloride: 99 mmol/L (ref 96–106)
Creatinine, Ser: 1.19 mg/dL (ref 0.76–1.27)
Glucose: 139 mg/dL — ABNORMAL HIGH (ref 70–99)
Potassium: 4.8 mmol/L (ref 3.5–5.2)
Sodium: 139 mmol/L (ref 134–144)
eGFR: 65 mL/min/{1.73_m2} (ref >59)

## 2024-02-04 ENCOUNTER — Telehealth: Payer: Self-pay

## 2024-02-04 ENCOUNTER — Other Ambulatory Visit: Payer: Self-pay | Admitting: Family Medicine

## 2024-02-04 DIAGNOSIS — I10 Essential (primary) hypertension: Secondary | ICD-10-CM

## 2024-02-04 NOTE — Telephone Encounter (Signed)
 Pharmacy informing PCP that Amlodipine is not covered by insurance, please send an alternative medication to [facility] located on [address], thanks! Penni Bombard CMA

## 2024-02-05 ENCOUNTER — Encounter: Payer: Self-pay | Admitting: Family Medicine

## 2024-02-05 ENCOUNTER — Other Ambulatory Visit (HOSPITAL_COMMUNITY): Payer: Self-pay

## 2024-02-06 ENCOUNTER — Other Ambulatory Visit: Payer: Self-pay

## 2024-02-06 ENCOUNTER — Other Ambulatory Visit: Payer: Self-pay | Admitting: Student

## 2024-02-06 ENCOUNTER — Telehealth: Payer: Self-pay

## 2024-02-06 DIAGNOSIS — I10 Essential (primary) hypertension: Secondary | ICD-10-CM

## 2024-02-06 MED ORDER — AMLODIPINE-OLMESARTAN 5-40 MG PO TABS
1.0000 | ORAL_TABLET | Freq: Every day | ORAL | 2 refills | Status: DC
Start: 2024-02-06 — End: 2024-02-08

## 2024-02-06 NOTE — Telephone Encounter (Signed)
 Sent refill of combo medication to patient's pharmacy. If issue arises with filling, please let me know

## 2024-02-06 NOTE — Telephone Encounter (Signed)
 Pharmacy Patient Advocate Encounter   Received notification from CoverMyMeds that prior authorization for amLODIPine-Olmesartan 5-40MG  is required/requested.   Insurance verification completed.   The patient is insured through [facility] .   PA required; PA submitted to above mentioned insurance via CoverMyMeds Key/confirmation #/EOC [id number]. Status is pending

## 2024-02-06 NOTE — Progress Notes (Signed)
 Refill sent for Azor (amlodipine-olmesartan)

## 2024-02-08 ENCOUNTER — Other Ambulatory Visit: Payer: Self-pay | Admitting: Student

## 2024-02-08 MED ORDER — AMLODIPINE BESYLATE-VALSARTAN 5-160 MG PO TABS: 1.0000 | ORAL_TABLET | Freq: Every day | ORAL | 1 refills | Status: DC

## 2024-02-08 NOTE — Telephone Encounter (Signed)
 Pharmacy Patient Advocate Encounter  Received notification from [facility] that Prior Authorization for amlodipine-olmesartan 5-40mg  has been DENIED.  Full denial letter will be uploaded to the media tab. See denial reason below.  We are denying your request because we do not show that you have tried at least 2 covered drugs that can treat your condition.   Other covered drug(s) is/are:  amlodipine oral tablet ( 2.5 mg, 5 mg, 10 mg) and olmesartan tablet (5 mg, 20 mg, 40 mg) (available as separate products, not as a combination);  amlodipine-valsartan- Hydrochlorothiazide oral tablet (10-160-12.5 mg, 10-160-25 mg, 10-320-25 mg, 5-160-12.5 mg, 5-160-25 mg);  amlodipine-valsartan oral tablet (10-160 mg, 10-320 mg, 5-160 mg, 5-320 mg);  losartan oral tablet ( 25 mg, 50 mg, 100 mg);  valsartan oral tablet ( 40 mg, 60 mg, 80 mg, 160 mg).   We may be able to make an exception to cover this drug. Your doctor will need to send [place] medical records showing that you tried this drug. If you cannot take the covered drug, your doctor will need to tell us why.   PA #/Case ID/Reference #: [id number]

## 2024-02-08 NOTE — Telephone Encounter (Signed)
 I sent in the amlodipine-valsartan. Thank you!  -BS

## 2024-02-12 ENCOUNTER — Telehealth: Payer: Self-pay | Admitting: Oncology

## 2024-02-12 ENCOUNTER — Inpatient Hospital Stay

## 2024-02-12 ENCOUNTER — Encounter: Payer: Self-pay | Admitting: Oncology

## 2024-02-12 ENCOUNTER — Inpatient Hospital Stay: Attending: Oncology | Admitting: Oncology

## 2024-02-12 VITALS — BP 131/90 | HR 94 | Temp 98.0°F | Resp 18 | Ht 65.0 in | Wt 156.0 lb

## 2024-02-12 DIAGNOSIS — E1159 Type 2 diabetes mellitus with other circulatory complications: Secondary | ICD-10-CM | POA: Insufficient documentation

## 2024-02-12 DIAGNOSIS — I10 Essential (primary) hypertension: Secondary | ICD-10-CM | POA: Diagnosis not present

## 2024-02-12 DIAGNOSIS — R569 Unspecified convulsions: Secondary | ICD-10-CM | POA: Insufficient documentation

## 2024-02-12 DIAGNOSIS — K219 Gastro-esophageal reflux disease without esophagitis: Secondary | ICD-10-CM | POA: Diagnosis not present

## 2024-02-12 DIAGNOSIS — F0390 Unspecified dementia without behavioral disturbance: Secondary | ICD-10-CM | POA: Diagnosis not present

## 2024-02-12 DIAGNOSIS — Z79899 Other long term (current) drug therapy: Secondary | ICD-10-CM | POA: Diagnosis not present

## 2024-02-12 DIAGNOSIS — G4730 Sleep apnea, unspecified: Secondary | ICD-10-CM | POA: Insufficient documentation

## 2024-02-12 DIAGNOSIS — D751 Secondary polycythemia: Secondary | ICD-10-CM | POA: Insufficient documentation

## 2024-02-12 DIAGNOSIS — R718 Other abnormality of red blood cells: Secondary | ICD-10-CM

## 2024-02-12 LAB — CMP (CANCER CENTER ONLY)
ALT: 47 U/L — ABNORMAL HIGH (ref 0–44)
AST: 41 U/L (ref 15–41)
Albumin: 4.5 g/dL (ref 3.5–5.0)
Alkaline Phosphatase: 94 U/L (ref 38–126)
Anion gap: 9 (ref 5–15)
BUN: 12 mg/dL (ref 8–23)
CO2: 26 mmol/L (ref 22–32)
Calcium: 9.8 mg/dL (ref 8.9–10.3)
Chloride: 102 mmol/L (ref 98–111)
Creatinine: 0.82 mg/dL (ref 0.61–1.24)
GFR, Estimated: >60 mL/min (ref >60)
Glucose, Bld: 94 mg/dL (ref 70–99)
Potassium: 4.2 mmol/L (ref 3.5–5.1)
Sodium: 137 mmol/L (ref 135–145)
Total Bilirubin: 0.7 mg/dL (ref 0.0–1.2)
Total Protein: 7.8 g/dL (ref 6.5–8.1)

## 2024-02-12 LAB — FERRITIN: Ferritin: 353 ng/mL — ABNORMAL HIGH (ref 24–336)

## 2024-02-12 LAB — CBC WITH DIFFERENTIAL (CANCER CENTER ONLY)
Abs Immature Granulocytes: 0.02 10*3/uL (ref 0.00–0.07)
Basophils Absolute: 0.1 10*3/uL (ref 0.0–0.1)
Basophils Relative: 1 %
Eosinophils Absolute: 0 10*3/uL (ref 0.0–0.5)
Eosinophils Relative: 1 %
HCT: 49.5 % (ref 39.0–52.0)
Hemoglobin: 16.7 g/dL (ref 13.0–17.0)
Immature Granulocytes: 0 %
Lymphocytes Relative: 23 %
Lymphs Abs: 1.3 10*3/uL (ref 0.7–4.0)
MCH: 31.2 pg (ref 26.0–34.0)
MCHC: 33.7 g/dL (ref 30.0–36.0)
MCV: 92.5 fL (ref 80.0–100.0)
Monocytes Absolute: 0.8 10*3/uL (ref 0.1–1.0)
Monocytes Relative: 13 %
Neutro Abs: 3.5 10*3/uL (ref 1.7–7.7)
Neutrophils Relative %: 62 %
Platelet Count: 165 10*3/uL (ref 150–400)
RBC: 5.35 MIL/uL (ref 4.22–5.81)
RDW: 13.2 % (ref 11.5–15.5)
WBC Count: 5.7 10*3/uL (ref 4.0–10.5)
nRBC: 0 % (ref 0.0–0.2)

## 2024-02-12 LAB — IRON AND TIBC
Iron: 122 ug/dL (ref 45–182)
Saturation Ratios: 32 % (ref 17.9–39.5)
TIBC: 385 ug/dL (ref 250–450)
UIBC: 263 ug/dL

## 2024-02-12 NOTE — Progress Notes (Signed)
 [facility]  HEMATOLOGY CLINIC CONSULTATION NOTE    PATIENT NAME: Manuel Reyes   MR#: [id number] DOB: 1951/04/22  DATE OF SERVICE: 02/12/2024   REFERRING PHYSICIAN  Tawanna Cooler McDiarmid, MD  Patient Care Team: Alicia Amel, MD as PCP - General (Family Medicine) McDiarmid, Leighton Roach, MD as Attending Physician (Family Medicine)    REASON FOR CONSULTATION/ CHIEF COMPLAINT: Polycythemia  ASSESSMENT & PLAN:  Manuel Reyes is a 73 y.o. gentleman with past medical history of diabetes mellitus, hypertension, dyslipidemia, hx of seizures, dementia, GERD, was referred to our clinic for evaluation of polycythemia.  Polycythemia Presents with elevated hemoglobin of 18.4 and hematocrit of 54 in March, with slight elevation since at least August 2024.   Suspected secondary to sleep apnea, as he does not smoke and has no history of tobacco use.   The goal is to maintain hematocrit below 55 to prevent complications such as thrombosis and cerebrovascular accidents. Phlebotomy will be performed if hematocrit exceeds 55 to manage hemoglobin levels.  Clinical picture does not indicate primary polycythemia.  We will check JAK2 mutation analysis with reflex testing to rule out the same.  Will also check erythropoietin level.  Labs today actually showed improved hemoglobin of 16.7, hematocrit 49.5.  Currently no indication for therapeutic phlebotomy.  White count, plate count are within normal limits.  CMP, iron studies unremarkable.  I will call his son with results next week.  - Schedule follow-up in three months for repeat labs.  Seizure-like activity (HCC)  Seizure disorder well-managed with medication, with no seizures in over a year.    I reviewed lab results and outside records for this visit and discussed relevant results with the patient. Diagnosis, plan of care and treatment options were also discussed in detail with the patient. Opportunity provided to ask  questions and answers provided to his apparent satisfaction. Provided instructions to call our clinic with any problems, questions or concerns prior to return visit. I recommended to continue follow-up with PCP and sub-specialists. He verbalized understanding and agreed with the plan. No barriers to learning was detected.  Meryl Crutch, MD  02/12/2024 5:43 PM   CANCER CENTER [facility] [address] [place] [address] Dept: [phone] Dept Fax: [phone]   HISTORY OF PRESENTING ILLNESS:   Discussed the use of AI scribe software for clinical note transcription with the patient, who gave verbal consent to proceed.   History of Present Illness  He has a history of elevated red blood cell count, with a hemoglobin level of 18.4 g/dL and hematocrit of 64% noted in March 2025. Previous records from August 2024 showed a hemoglobin of 17.7 g/dL and hematocrit of 40.3%. In 2018, his hemoglobin was 18.5 g/dL and hematocrit was 47.4%.  No headaches, vision problems, chest pain, or trouble breathing. There is a concern for sleep apnea, as he wakes up at night. A sleep study has been suggested.  But because of his dementia history, patient could not cooperate.  He has a history of dementia and seizures. His last seizure occurred over a year ago, and he is currently on medication for seizure control.  No abdominal pain, regular bowel movements, and no blood in stools. He maintains adequate hydration and has been eating well, maintaining his weight.   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 [place]- took entire prescribed course of medication    SURGICAL HISTORY: Past Surgical History:  Procedure Laterality Date   AMPUTATION Left 04/16/2016   Procedure: AMPUTATION DIGIT;  Surgeon: Bradly Bienenstock, MD;  Location: [facility] OR;  Service: Orthopedics;  Laterality: Left;   NO PAST SURGERIES      SOCIAL HISTORY: Social History   Socioeconomic History   Marital status: Married    Spouse name: Industrial/product designer   Number of children: 3   Years of education: Not on file   Highest education level: Not on file  Occupational History   Occupation: unemployed  Tobacco Use   Smoking status: Never   Smokeless tobacco: Never  Vaping Use   Vaping status: Never Used  Substance and Sexual Activity   Alcohol use: Yes    Alcohol/week: 1.0 standard drink of alcohol    Types: 1 Cans of beer per week    Comment: Beer ocass   Drug use: No   Sexual activity: Not Currently  Other Topics Concern   Not on file  Social History Narrative   Lives w/ son and daughter in Social [place], grandchild and wife   Caffeine use:  1 cup per day   Social Drivers of Health   Financial Resource Strain: Not on file  Food Insecurity: No Food Insecurity (02/12/2024)   Hunger Vital Sign    Worried About Running Out of Food in the Last Year: Never true    Ran Out of Food in the Last Year: Never true  Transportation Needs: Patient Declined (12/05/2022)   PRAPARE - Administrator, Civil Service (Medical): Patient declined    Lack of Transportation (Non-Medical): Patient declined  Physical Activity: Not on file   Stress: Not on file  Social Connections: Not on file  Intimate Partner Violence: Not At Risk (02/12/2024)   Humiliation, Afraid, Rape, and Kick questionnaire    Fear of Current or Ex-Partner: No    Emotionally Abused: No    Physically Abused: No    Sexually Abused: No    FAMILY HISTORY: Family History  Problem Relation Age of Onset   Seizures Neg Hx    Dementia Neg Hx    Colon cancer Neg Hx    Esophageal cancer Neg Hx    Rectal cancer Neg Hx    Stomach cancer Neg Hx    Colon polyps Neg Hx     CURRENT MEDICATIONS   Current Outpatient Medications  Medication Instructions   amLODipine-valsartan (EXFORGE) 5-160 MG tablet 1 tablet, Oral, Daily   donepezil (ARICEPT) 10 mg, Oral, Daily at bedtime   gabapentin (NEURONTIN) 300 mg, Oral, Daily at bedtime   levETIRAcetam (KEPPRA) 500 mg, Oral, 2 times daily   memantine (NAMENDA) 5 mg, Oral,  2 times daily   rosuvastatin (CRESTOR) 40 mg, Oral, Daily   senna-docusate (SENNA PLUS) 8.6-50 MG tablet 1 tablet, Oral, At bedtime PRN   tamsulosin (FLOMAX) 0.4 mg, Oral, Daily after supper   venlafaxine XR (EFFEXOR-XR) 37.5 MG 24 hr capsule TAKE ONE CAPSULE BY MOUTH IN THE MORNING (WITH BREAKFAST)     ALLERGIES  He has no known allergies.  REVIEW OF SYSTEMS:    Review of Systems - Oncology  All of the other pertinent systems were reviewed with the patient and were unremarkable except as mentioned above in HPI.  PHYSICAL EXAMINATION:    Onc Performance Status - 02/12/24 1026       ECOG Perf Status   ECOG Perf Status Ambulatory and capable of all selfcare but unable to carry out any work activities.  Up and about more than 50% of waking hours      KPS SCALE   KPS % SCORE Requires considerable assistance, and frequent medical care             Vitals:   02/12/24 1021 02/12/24 1022  BP: (!) 146/90 (!) 131/90  Pulse: 94   Resp: 18   Temp: 98 F (36.7 C)   SpO2: 96%    Filed Weights   02/12/24 1021  Weight: 156 lb (70.8  kg)    Physical Exam Constitutional:      General: He is not in acute distress.    Appearance: Normal appearance.  HENT:     Head: Normocephalic and atraumatic.  Eyes:     General: No scleral icterus.    Conjunctiva/sclera: Conjunctivae normal.  Cardiovascular:     Rate and Rhythm: Normal rate and regular rhythm.     Heart sounds: Normal heart sounds.  Pulmonary:     Effort: Pulmonary effort is normal.     Breath sounds: Normal breath sounds.  Abdominal:     General: There is no distension.  Musculoskeletal:     Right lower leg: No edema.     Left lower leg: No edema.  Neurological:     Mental Status: He is alert.       LABORATORY DATA:   I have reviewed the data as listed.  Results for orders placed or performed in visit on 02/12/24  Ferritin  Result Value Ref Range   Ferritin 353 (H) 24 - 336 ng/mL  Iron and TIBC  Result Value Ref Range   Iron 122 45 - 182 ug/dL   TIBC 956 213 - 086 ug/dL   Saturation Ratios 32 17.9 - 39.5 %   UIBC 263 ug/dL  CMP (Cancer Center only)  Result Value Ref Range   Sodium 137 135 - 145 mmol/L   Potassium 4.2 3.5 - 5.1 mmol/L   Chloride 102 98 - 111 mmol/L   CO2 26 22 - 32 mmol/L   Glucose, Bld 94 70 - 99 mg/dL   BUN 12 8 - 23 mg/dL   Creatinine 5.78 4.69 - 1.24 mg/dL   Calcium 9.8 8.9 - 62.9 mg/dL   Total Protein 7.8 6.5 - 8.1 g/dL   Albumin 4.5 3.5 - 5.0 g/dL   AST 41 15 - 41 U/L   ALT 47 (H) 0 - 44 U/L   Alkaline Phosphatase 94 38 - 126 U/L   Total Bilirubin 0.7 0.0 - 1.2 mg/dL   GFR, Estimated >52 >84 mL/min   Anion gap 9 5 - 15  CBC with Differential (Cancer Center Only)  Result Value Ref Range  WBC Count 5.7 4.0 - 10.5 K/uL   RBC 5.35 4.22 - 5.81 MIL/uL   Hemoglobin 16.7 13.0 - 17.0 g/dL   HCT 02.5 42.7 - 06.2 %   MCV 92.5 80.0 - 100.0 fL   MCH 31.2 26.0 - 34.0 pg   MCHC 33.7 30.0 - 36.0 g/dL   RDW 37.6 28.3 - 15.1 %   Platelet Count 165 150 - 400 K/uL   nRBC 0.0 0.0 - 0.2 %   Neutrophils Relative % 62 %    Neutro Abs 3.5 1.7 - 7.7 K/uL   Lymphocytes Relative 23 %   Lymphs Abs 1.3 0.7 - 4.0 K/uL   Monocytes Relative 13 %   Monocytes Absolute 0.8 0.1 - 1.0 K/uL   Eosinophils Relative 1 %   Eosinophils Absolute 0.0 0.0 - 0.5 K/uL   Basophils Relative 1 %   Basophils Absolute 0.1 0.0 - 0.1 K/uL   Immature Granulocytes 0 %   Abs Immature Granulocytes 0.02 0.00 - 0.07 K/uL    RADIOGRAPHIC STUDIES:  No recent pertinent imaging available to review.  Orders Placed This Encounter  Procedures   CBC with Differential (Cancer Center Only)    Standing Status:   Future    Number of Occurrences:   1    Expiration Date:   02/11/2025   CMP (Cancer Center only)    Standing Status:   Future    Number of Occurrences:   1    Expiration Date:   02/11/2025   Iron and TIBC    Standing Status:   Future    Number of Occurrences:   1    Expiration Date:   02/11/2025   Ferritin    Standing Status:   Future    Number of Occurrences:   1    Expiration Date:   02/11/2025   Erythropoietin    Standing Status:   Future    Number of Occurrences:   1    Expiration Date:   02/11/2025   JAK2 V617F rfx CALR/MPL/E12-15    Standing Status:   Future    Number of Occurrences:   1    Expiration Date:   02/11/2025    Future Appointments  Date Time Provider Department Center  02/19/2024  2:30 PM Penn Grissett, Gale Jude, MD [facility] None  05/13/2024 10:30 AM [facility] PHLEBOTOMIST [facility] None  05/13/2024 10:45 AM Loomis Anacker, Gale Jude, MD [facility] None      I spent a total of 55 minutes during this encounter with the patient including review of chart and various tests results, discussions about plan of care and coordination of care plan.   This document was completed utilizing speech recognition software. Grammatical errors, random word insertions, pronoun errors, and incomplete sentences are an occasional consequence of this system due to software limitations, ambient noise, and hardware issues. Any formal questions or concerns  about the content, text or information contained within the body of this dictation should be directly addressed to the provider for clarification.

## 2024-02-12 NOTE — Progress Notes (Signed)
 Patient has lost his sense of taste for now and is also exercising daily. Encouraged to take in nutritional supplements as his BMI is 25.96.

## 2024-02-12 NOTE — Telephone Encounter (Signed)
 Patient has been scheduled for follow-up visit per 02/12/24 LOS.  Pt aware of scheduled appt details.

## 2024-02-12 NOTE — Assessment & Plan Note (Addendum)
 Presents with elevated hemoglobin of 18.4 and hematocrit of 54 in March, with slight elevation since at least August 2024.   Suspected secondary to sleep apnea, as he does not smoke and has no history of tobacco use.   The goal is to maintain hematocrit below 55 to prevent complications such as thrombosis and cerebrovascular accidents. Phlebotomy will be performed if hematocrit exceeds 55 to manage hemoglobin levels.  Clinical picture does not indicate primary polycythemia.  We will check JAK2 mutation analysis with reflex testing to rule out the same.  Will also check erythropoietin level.  Labs today actually showed improved hemoglobin of 16.7, hematocrit 49.5.  Currently no indication for therapeutic phlebotomy.  White count, plate count are within normal limits.  CMP, iron studies unremarkable.  I will call his son with results next week.  - Schedule follow-up in three months for repeat labs.

## 2024-02-12 NOTE — Assessment & Plan Note (Signed)
  Seizure disorder well-managed with medication, with no seizures in over a year.

## 2024-02-14 LAB — ERYTHROPOIETIN: Erythropoietin: 5.3 m[IU]/mL (ref 2.6–18.5)

## 2024-02-19 ENCOUNTER — Inpatient Hospital Stay: Admitting: Oncology

## 2024-02-19 NOTE — Progress Notes (Unsigned)
 [facility]  HEMATOLOGY-ONCOLOGY ELECTRONIC VISIT PROGRESS NOTE  PATIENT NAME: Manuel Reyes   MR#: [id number] DOB: 1950-11-13  DATE OF SERVICE: 02/19/2024  Patient Care Team: Limmie Ren, MD as PCP - General (Family Medicine) McDiarmid, Demetra Filter, MD as Attending Physician (Family Medicine)  I connected with the patient via telephone conference and verified that I am speaking with the correct person using two identifiers. The patient's location is at home and I am providing care from the [facility].  I discussed the limitations, risks, security and privacy concerns of performing an evaluation and management service by e-visits and the availability of in person appointments. I also discussed with the patient that there may be a patient responsible charge related to this service. The patient expressed understanding and agreed to proceed.   ASSESSMENT & PLAN:   Manuel Reyes is a 73 y.o. gentleman with past medical history of diabetes mellitus, hypertension, dyslipidemia, hx of seizures, dementia, GERD, was referred to our clinic in April 2025 for evaluation of polycythemia.   No problem-specific Assessment & Plan notes found for this encounter.   Assessment and Plan Assessment & Plan       I discussed the assessment and treatment plan with the patient. The patient was provided an opportunity to ask questions and all were answered. The patient agreed with the plan and demonstrated an understanding of the instructions. The patient was advised to call back or seek an in-person evaluation if the symptoms worsen or if the condition fails to improve as anticipated.    I spent *** minutes over the phone with the patient reviewing test results, discuss management and coordination/planning of care.  [name], MD 02/19/2024 11:15 AM [facility] - A DEPT OF [facility] [address] [place] [address] Dept: [phone] Dept Fax: [phone]   INTERVAL HISTORY:  Please see above for problem oriented charting.  The purpose of today's discussion is to explain recent lab results and to formulate plan of care.  Discussed the use of AI scribe software for clinical note transcription with the patient, who gave verbal consent to proceed.  History of Present Illness     ***  SUMMARY OF HEMATOLOGY HISTORY:  He has a history of elevated red blood cell count, with a hemoglobin level of 18.4 g/dL and hematocrit of 47% noted in March 2025. Previous records from August 2024 showed a hemoglobin of 17.7 g/dL and hematocrit of 42.5%. In 2018, his hemoglobin was 18.5 g/dL and hematocrit was 95.6%.   No headaches, vision problems, chest pain, or trouble breathing. There is a concern for sleep apnea, as he wakes up at night. A sleep study has been suggested.  But because of his dementia history, patient could not cooperate.   He has a history of dementia and seizures. His last seizure occurred over a year ago, and he is currently on medication for seizure control.  On his initial consultation with us  on 02/12/2024, labs actually showed improved hemoglobin of 16.7, hematocrit 49.5.  White count and platelet count were within normal limits.  CMP, iron studies were unremarkable.  Erythropoietin  was within normal limits.  JAK2 mutation testing was negative.  Reflex testing to include CALR, MPL, exon 12-15 mutations were also negative. ***  Polycythemia is suspected secondary to sleep apnea, as he does not smoke and has no history of tobacco use.    The goal is to  maintain hematocrit below 55 to prevent complications such as thrombosis and cerebrovascular accidents.  Therapeutic phlebotomy will be performed if hematocrit exceeds 55 to avoid hyperviscosity complications.    REVIEW OF SYSTEMS:    Review of Systems - Oncology  All other pertinent systems were reviewed  with the patient and are negative.  I have reviewed the past medical history, past surgical history, social history and family history with the patient and they are unchanged from previous note.  ALLERGIES:  He has no known allergies.  MEDICATIONS:  Current Outpatient Medications  Medication Sig Dispense Refill   amLODipine -valsartan  (EXFORGE ) 5-160 MG tablet Take 1 tablet by mouth daily. 30 tablet 1   donepezil  (ARICEPT ) 10 MG tablet Take 1 tablet (10 mg total) by mouth at bedtime. 90 tablet 1   gabapentin  (NEURONTIN ) 300 MG capsule Take 1 capsule (300 mg total) by mouth at bedtime. 93 capsule 1   levETIRAcetam  (KEPPRA ) 500 MG tablet Take 1 tablet (500 mg total) by mouth 2 (two) times daily. 62 tablet 11   memantine  (NAMENDA ) 5 MG tablet Take 1 tablet (5 mg total) by mouth 2 (two) times daily. 62 tablet 11   rosuvastatin  (CRESTOR ) 40 MG tablet Take 1 tablet (40 mg total) by mouth daily. 90 tablet 3   senna-docusate (SENNA PLUS) 8.6-50 MG tablet Take 1 tablet by mouth at bedtime as needed for mild constipation. 31 tablet 2   tamsulosin  (FLOMAX ) 0.4 MG CAPS capsule Take 1 capsule (0.4 mg total) by mouth daily after supper. 90 capsule 0   venlafaxine  XR (EFFEXOR -XR) 37.5 MG 24 hr capsule TAKE ONE CAPSULE BY MOUTH IN THE MORNING (WITH BREAKFAST) 31 capsule 5   No current facility-administered medications for this visit.    PHYSICAL EXAMINATION:  ***   LABORATORY DATA:   I have reviewed the data as listed.  Recent Results (from the past 2160 hours)  CBC     Status: Abnormal   Collection Time: 01/07/24 10:53 AM  Result Value Ref Range   WBC 5.5 3.4 - 10.8 x10E3/uL   RBC 5.90 (H) 4.14 - 5.80 x10E6/uL   Hemoglobin 18.4 (H) 13.0 - 17.7 g/dL   Hematocrit 16.1 (H) 09.6 - 51.0 %   MCV 92 79 - 97 fL   MCH 31.2 26.6 - 33.0 pg   MCHC 33.9 31.5 - 35.7 g/dL   RDW 04.5 40.9 - 81.1 %   Platelets 196 150 - 450 x10E3/uL  Basic Metabolic Panel     Status: Abnormal   Collection Time: 01/31/24   4:31 PM  Result Value Ref Range   Glucose 139 (H) 70 - 99 mg/dL   BUN 15 8 - 27 mg/dL   Creatinine, Ser 9.14 0.76 - 1.27 mg/dL   eGFR 65 >78 GN/FAO/1.30   BUN/Creatinine Ratio 13 10 - 24   Sodium 139 134 - 144 mmol/L   Potassium 4.8 3.5 - 5.2 mmol/L   Chloride 99 96 - 106 mmol/L   CO2 24 20 - 29 mmol/L   Calcium  10.0 8.6 - 10.2 mg/dL  Erythropoietin      Status: None   Collection Time: 02/12/24 11:05 AM  Result Value Ref Range   Erythropoietin  5.3 2.6 - 18.5 mIU/mL    Comment: (NOTE) [employer] UniCel DxI 800 Immunoassay System Values obtained with different assay methods or kits cannot be used interchangeably. Results cannot be interpreted as absolute evidence of the presence or absence of malignant disease. Performed At: [facility] [address] [place], [place]  [id number] Pearlean Botts MD WU:[id number]   Ferritin     Status: Abnormal   Collection Time: 02/12/24 11:05 AM  Result Value Ref Range   Ferritin 353 (H) 24 - 336 ng/mL    Comment: Performed at [facility], [address] [place], [place] [address]  Iron and TIBC     Status: None   Collection Time: 02/12/24 11:05 AM  Result Value Ref Range   Iron 122 45 - 182 ug/dL   TIBC 308 657 - 846 ug/dL   Saturation Ratios 32 17.9 - 39.5 %   UIBC 263 ug/dL    Comment: Performed at [facility], [address], [place], [place] [address]  CMP (Cancer Center only)     Status: Abnormal   Collection Time: 02/12/24 11:05 AM  Result Value Ref Range   Sodium 137 135 - 145 mmol/L   Potassium 4.2 3.5 - 5.1 mmol/L   Chloride 102 98 - 111 mmol/L   CO2 26 22 - 32 mmol/L   Glucose, Bld 94 70 - 99 mg/dL    Comment: Glucose reference range applies only to samples taken after fasting for at least 8 hours.   BUN 12 8 - 23 mg/dL   Creatinine 2.84 1.32 - 1.24 mg/dL   Calcium  9.8 8.9 - 10.3 mg/dL   Total Protein 7.8 6.5 - 8.1 g/dL   Albumin 4.5 3.5 - 5.0 g/dL   AST 41 15 - 41 U/L   ALT 47 (H)  0 - 44 U/L   Alkaline Phosphatase 94 38 - 126 U/L   Total Bilirubin 0.7 0.0 - 1.2 mg/dL   GFR, Estimated >44 >01 mL/min    Comment: (NOTE) Calculated using the CKD-EPI Creatinine Equation (2021)    Anion gap 9 5 - 15    Comment: Performed at [facility], [address] [place], [place] [address]  CBC with Differential (Cancer Center Only)     Status: None   Collection Time: 02/12/24 11:05 AM  Result Value Ref Range   WBC Count 5.7 4.0 - 10.5 K/uL   RBC 5.35 4.22 - 5.81 MIL/uL   Hemoglobin 16.7 13.0 - 17.0 g/dL   HCT 36.6 44.0 - 34.7 %   MCV 92.5 80.0 - 100.0 fL   MCH 31.2 26.0 - 34.0 pg   MCHC 33.7 30.0 - 36.0 g/dL   RDW 42.5 95.6 - 38.7 %   Platelet Count 165 150 - 400 K/uL   nRBC 0.0 0.0 - 0.2 %   Neutrophils Relative % 62 %   Neutro Abs 3.5 1.7 - 7.7 K/uL   Lymphocytes Relative 23 %   Lymphs Abs 1.3 0.7 - 4.0 K/uL   Monocytes Relative 13 %   Monocytes Absolute 0.8 0.1 - 1.0 K/uL   Eosinophils Relative 1 %   Eosinophils Absolute 0.0 0.0 - 0.5 K/uL   Basophils Relative 1 %   Basophils Absolute 0.1 0.0 - 0.1 K/uL   Immature Granulocytes 0 %   Abs Immature Granulocytes 0.02 0.00 - 0.07 K/uL    Comment: Performed at [facility], [address], [place], [place] [address]     RADIOGRAPHIC STUDIES:  I have personally reviewed the radiological images as listed and agree with the findings in the report.  No results found.  *** No recent pertinent imaging studies available to review.  No orders of the defined types were placed in this encounter.    Future Appointments  Date Time Provider Department Center  02/19/2024  2:30 PM Arva Slaugh, Gale Jude, MD [facility] None  05/13/2024 10:30 AM [facility] PHLEBOTOMIST [facility] None  05/13/2024 10:45 AM Cleophus Mendonsa, Gale Jude, MD [facility] None    This document was completed utilizing speech recognition software. Grammatical errors, random word insertions, pronoun errors, and incomplete sentences are an  occasional consequence of this system due to software limitations, ambient noise, and hardware issues. Any formal questions or concerns about the content, text or information contained within the body of this dictation should be directly addressed to the provider for clarification.

## 2024-02-20 LAB — JAK2 V617F RFX CALR/MPL/E12-15

## 2024-02-20 LAB — CALR +MPL + E12-E15  (REFLEX)

## 2024-02-20 NOTE — Progress Notes (Signed)
 This telephone visit had to be rescheduled because of the pending labs.

## 2024-02-21 ENCOUNTER — Ambulatory Visit: Admitting: Physician Assistant

## 2024-02-26 ENCOUNTER — Inpatient Hospital Stay (HOSPITAL_BASED_OUTPATIENT_CLINIC_OR_DEPARTMENT_OTHER): Admitting: Oncology

## 2024-02-26 ENCOUNTER — Encounter: Payer: Self-pay | Admitting: Oncology

## 2024-02-26 DIAGNOSIS — D751 Secondary polycythemia: Secondary | ICD-10-CM | POA: Diagnosis not present

## 2024-02-26 NOTE — Progress Notes (Signed)
 [facility]  HEMATOLOGY-ONCOLOGY ELECTRONIC VISIT PROGRESS NOTE  PATIENT NAME: Manuel Reyes   MR#: [id number] DOB: December 30, 1950  DATE OF SERVICE: 02/26/2024  Patient Care Team: Limmie Ren, MD as PCP - General (Family Medicine) McDiarmid, Demetra Filter, MD as Attending Physician (Family Medicine)  I connected with the patient via telephone conference and verified that I am speaking with the correct person using two identifiers. The patient's location is at home and I [facility].  I discussed the limitations, risks, security and privacy concerns of performing an evaluation and management service by e-visits and the availability of in person appointments. I also discussed with the patient that there may be a patient responsible charge related to this service. The patient expressed understanding and agreed to proceed.   ASSESSMENT & PLAN:   Manuel Reyes is a 73 y.o. gentleman with past medical history of diabetes mellitus, hypertension, dyslipidemia, hx of seizures, dementia, GERD, was referred to our clinic in April 2025 for evaluation of polycythemia.   Secondary polycythemia He has a history of elevated red blood cell count, with a hemoglobin level of 18.4 g/dL and hematocrit of 40% noted in March 2025. Previous records from August 2024 showed a hemoglobin of 17.7 g/dL and hematocrit of 98.1%. In 2018, his hemoglobin was 18.5 g/dL and hematocrit was 19.1%.   No headaches, vision problems, chest pain, or trouble breathing. There is a concern for sleep apnea, as he wakes up at night. A sleep study has been suggested.  But because of his dementia history, patient could not cooperate.   He has a history of dementia and seizures. His last seizure occurred over a year ago, and he is currently on medication for seizure control.  On his initial consultation with us  on 02/12/2024, labs actually showed improved hemoglobin of 16.7, hematocrit 49.5.   White count and platelet count were within normal limits.  CMP, iron studies were unremarkable.  Erythropoietin  was within normal limits.  JAK2 mutation testing was negative.  Reflex testing to include CALR, MPL, exon 12-15 mutations were also negative.   Polycythemia is suspected secondary to sleep apnea, as he does not smoke and has no history of tobacco use.    The goal is to maintain hematocrit below 55 to prevent complications such as thrombosis and cerebrovascular accidents.  Therapeutic phlebotomy will be performed if hematocrit exceeds 55 to avoid hyperviscosity complications.  Currently no indication for therapeutic phlebotomy.  Will continue surveillance.   I discussed the assessment and treatment plan with the patient. The patient was provided an opportunity to ask questions and all were answered. The patient agreed with the plan and demonstrated an understanding of the instructions. The patient was advised to call back or seek an in-person evaluation if the symptoms worsen or if the condition fails to improve as anticipated.    I spent 11 minutes over the phone with the patient reviewing test results, discuss management and coordination/planning of care.  [name], MD 02/26/2024 3:06 PM [facility] - A DEPT OF [facility] [address] [place] [address] Dept: [phone] Dept Fax: [phone]   INTERVAL HISTORY:  Please see above for problem oriented charting.  The purpose of today's discussion is to explain recent lab results and to formulate plan of care.  Discussed the use of AI scribe software for clinical note transcription with the patient, who gave verbal consent  to proceed.  History of Present Illness  He has been undergoing evaluation for elevated red blood cell count. Recent blood work showed a hemoglobin level of 16.7 and a hematocrit of 49.5, both within normal limits.  Comprehensive metabolic panel results, including kidney and liver function tests, were normal.  Iron studies indicated normal iron levels, but ferritin was slightly elevated, potentially due to inflammation. Erythropoietin  levels were normal, excluding kidney tumors as a cause. Genetic testing for JAK2, CLR, MPL, and exon 12 mutations returned negative, indicating no bone marrow abnormalities.  There is a possibility that sleep apnea might be contributing to the elevated red blood cell count.    SUMMARY OF HEMATOLOGY HISTORY:  He has a history of elevated red blood cell count, with a hemoglobin level of 18.4 g/dL and hematocrit of 16% noted in March 2025. Previous records from August 2024 showed a hemoglobin of 17.7 g/dL and hematocrit of 10.9%. In 2018, his hemoglobin was 18.5 g/dL and hematocrit was 60.4%.   No headaches, vision problems, chest pain, or trouble breathing. There is a concern for sleep apnea, as he wakes up at night. A sleep study has been suggested.  But because of his dementia history, patient could not cooperate.   He has a history of dementia and seizures. His last seizure occurred over a year ago, and he is currently on medication for seizure control.  On his initial consultation with us  on 02/12/2024, labs actually showed improved hemoglobin of 16.7, hematocrit 49.5.  White count and platelet count were within normal limits.  CMP, iron studies were unremarkable.  Erythropoietin  was within normal limits.  JAK2 mutation testing was negative.  Reflex testing to include CALR, MPL, exon 12-15 mutations were also negative.   Polycythemia is suspected secondary to sleep apnea, as he does not smoke and has no history of tobacco use.    The goal is to maintain hematocrit below 55 to prevent complications such as thrombosis and cerebrovascular accidents.  Therapeutic phlebotomy will be performed if hematocrit exceeds 55 to avoid hyperviscosity complications.  Currently no indication  for therapeutic phlebotomy.  Will continue surveillance.  REVIEW OF SYSTEMS:    Review of Systems - Oncology  All other pertinent systems were reviewed with the patient and are negative.  I have reviewed the past medical history, past surgical history, social history and family history with the patient and they are unchanged from previous note.  ALLERGIES:  He has no known allergies.  MEDICATIONS:  Current Outpatient Medications  Medication Sig Dispense Refill   amLODipine -valsartan  (EXFORGE ) 5-160 MG tablet Take 1 tablet by mouth daily. 30 tablet 1   donepezil  (ARICEPT ) 10 MG tablet Take 1 tablet (10 mg total) by mouth at bedtime. 90 tablet 1   gabapentin  (NEURONTIN ) 300 MG capsule Take 1 capsule (300 mg total) by mouth at bedtime. 93 capsule 1   levETIRAcetam  (KEPPRA ) 500 MG tablet Take 1 tablet (500 mg total) by mouth 2 (two) times daily. 62 tablet 11   memantine  (NAMENDA ) 5 MG tablet Take 1 tablet (5 mg total) by mouth 2 (two) times daily. 62 tablet 11   rosuvastatin  (CRESTOR ) 40 MG tablet Take 1 tablet (40 mg total) by mouth daily. 90 tablet 3   senna-docusate (SENNA PLUS) 8.6-50 MG tablet Take 1 tablet by mouth at bedtime as needed for mild constipation. 31 tablet 2   tamsulosin  (FLOMAX ) 0.4 MG CAPS capsule Take 1 capsule (0.4 mg total) by mouth daily after supper. 90 capsule 0  venlafaxine  XR (EFFEXOR -XR) 37.5 MG 24 hr capsule TAKE ONE CAPSULE BY MOUTH IN THE MORNING (WITH BREAKFAST) 31 capsule 5   No current facility-administered medications for this visit.    PHYSICAL EXAMINATION:   Onc Performance Status - 02/26/24 1500       ECOG Perf Status   ECOG Perf Status Ambulatory and capable of all selfcare but unable to carry out any work activities.  Up and about more than 50% of waking hours      KPS SCALE   KPS % SCORE Cares for self, unable to carry on normal activity or to do active work             LABORATORY DATA:   I have reviewed the data as  listed.  Recent Results (from the past 2160 hours)  CBC     Status: Abnormal   Collection Time: 01/07/24 10:53 AM  Result Value Ref Range   WBC 5.5 3.4 - 10.8 x10E3/uL   RBC 5.90 (H) 4.14 - 5.80 x10E6/uL   Hemoglobin 18.4 (H) 13.0 - 17.7 g/dL   Hematocrit 62.1 (H) 30.8 - 51.0 %   MCV 92 79 - 97 fL   MCH 31.2 26.6 - 33.0 pg   MCHC 33.9 31.5 - 35.7 g/dL   RDW 65.7 84.6 - 96.2 %   Platelets 196 150 - 450 x10E3/uL  Basic Metabolic Panel     Status: Abnormal   Collection Time: 01/31/24  4:31 PM  Result Value Ref Range   Glucose 139 (H) 70 - 99 mg/dL   BUN 15 8 - 27 mg/dL   Creatinine, Ser 9.52 0.76 - 1.27 mg/dL   eGFR 65 >84 XL/KGM/0.10   BUN/Creatinine Ratio 13 10 - 24   Sodium 139 134 - 144 mmol/L   Potassium 4.8 3.5 - 5.2 mmol/L   Chloride 99 96 - 106 mmol/L   CO2 24 20 - 29 mmol/L   Calcium  10.0 8.6 - 10.2 mg/dL  JAK2 U725D rfx GUYQ/IHK/V42-59     Status: None   Collection Time: 02/12/24 11:05 AM  Result Value Ref Range   Specimen Type Comment:     Comment: NOT PROVIDED   JAK2 V617F Result Comment     Comment: (NOTE) NEGATIVE The JAK2 V617F mutation is not detected in the provided specimen of this individual. Results should be interpreted in conjunction with clinical and other laboratory findings for the most accurate interpretation. This test was developed and its performance characteristics determined by [facility]. It has not been cleared or approved by the Food and Drug Administration.    Reflex Comment     Comment: (NOTE) Reflex to CALR Mutation Analysis, JAK2 Exon 12-15 Mutation Analysis, and MPL Mutation Analysis is indicated.    V617F Rfx CALR/MPL/E12-15 Bkgd Comment     Comment: (NOTE)    Molecular testing of blood or bone marrow is useful in the evaluation of suspected myeloproliferative neoplasms (MPN). Mutations in the JAK2, MPL, and CALR genes are present in virtually all MPNs and their presence help distinguish benign reactive processes from clonal  neoplasms. These mutations are generally considered mutually exclusive, although concurrent clones have been reported in rare patients. This test will assess for the JAK2V617F (exon 14) mutation first and will reflex to CALR mutation analysis, MPL mutation analysis, and JAK2 exon 12 to 15 mutation analysis if the JAK2V617F mutation is negative.    The JAK2 (Janus kinase 2) gene encodes for a non-receptor protein tyrosine kinase that activates cytokine and growth factor  signaling. The V617F (c.1849 G>T) mutation results in constitutive activation of JAK2 and downstream STAT5 and ERK signaling. The V617F mutation is observed in approximately 95% of polycythemia vera (PV), 60% of essential thromboc ythemia (ET) and primary myelofibrosis (PMF). It is also infrequently present (3-5%) in myelodysplastic syndrome, chronic myelomonocytic leukemia, and other atypical chronic myeloid disorders. A small percentage of JAK2 mutation positive patients (3.3%) contain other non-V617F mutations within exons 12 to 15. In particular, mutations in exon 12 of JAK2 have been described in approximately 3% of patients with PV. JAK2 allele burden correlates with clinical phenotype, with low levels of mutant allele characterized by thrombocytosis, intermediate levels with erythrocytosis, and high mutant allele burden correlating with enhanced myelopoiesis of the BM, leukocytosis, increasing spleen size, and circulating CD34-positive cells.    The CALR (Calreticulin) gene encodes for a multifunctional calcium -binding protein involved in many cellular activities such as growth, proliferation, adhesion, and programmed cell death. Among patients with JAK2 negative MPNs, CALR are found in a pproximately 70% of patients with JAK2-negative essential thrombocythemia (ET) and 60-88% of patients with JAK2-negative primary myelofibrosis(PMF). Only a minority of patients (approximately 8%) with myelodysplasia have  mutations in the CALR gene. CALR mutations are rarely detected in patients with de novo acute myeloid leukemia, chronic myelogenous leukemia, lymphoid leukemia, or solid tumors. CALR mutations are not detected in polycythemia and generally appear to be mutually exclusive with JAK2 mutations and MPL mutations. The majority of mutational changes involve a variety of insertion deletion mutations in exon 9 of the calreticulin gene: approximately 53% of all CALR mutations are a 52 bp deletion (type-1) while the second most prevalent mutation (approximately 32%) contains a 5 bp insertion (type-2). Other mutations (non-type 1 or type 2) are seen in a small minority of cases. CALR mutations in PMF tend to be with a favorable prognosis compared to JAK2 V617F [employer], whereas primary myelofibrosis negative for CALR, JAK2 V617F and MPL mutations (so-called triple negative) is associated with a poor prognosis and shorter survival.    The MPL (myeloproliferative leukemia virus oncogene) gene encodes the thrombopoietin receptor which regulates hematopoiesis and megakaryopoiesis. Activating MPL mutations are associated with a subset of myeloproliferative neoplasms and acute megakaryoblastic leukemia. MPL W515 mutations are present in approximately 5-8% of patients with primary myelofibrosis (PMF) and 1-4% of patients with essential thrombocythemia (ET). The S505 mutation is detected in patients with hereditary thrombocythemia.    Limitations    This assay has a sensitivity of approximately 1% VAF for JAK2 V617F, 2.5% VAF for other mutations in JAK2 exons 12 to 15, CALR mutations, and MPL mutations. Deletions in JAK2 up to 6 bp and insertions up to 34 bp have been detected in validation studies. Deletions in CALR up to 70 bp and i nsertions up to 12 bp have been detected in validation studies.    Method based next generation sequencing.     Comment: Comment [employer]    References Comment      Comment: (NOTE) Alghasham N, Alnouri Y, Abalkhail H, Wilfredo Hanly. Detection of mutations in JAK2 exons 12-15 by [employer] sequencing. Int J Lab Hematol. 2016 Feb;38(1):34-41. doi: 10.1111/ijlh.16109. Epub 2015 Sep 11. PMID: 60454098. Rana Burr, Charlie Constant, Hasserjian R, Serafin Dames, Borowitz MJ, Gina Lagos MM, [place] CD, Cazzola M, Vardiman JW. The 2016 revision to the World Health Organization classification of myeloid neoplasms and acute leukemia. Blood. 2016 May 19;127(20):2391-405. doi: 10.1182/blood-2016-03-643544. Epub 2016 Apr 11. PMID: 11914782. Asa Bjork [facility], Zhang  Paula Born, Albitar M. Mutation profile of JAK2 transcripts in patients with chronic myeloproliferative neoplasias. J Mol Diagn. 2009 Jan;11(1):49-53.doi: 10.2353/jmoldx.2009.080114. Epub 2008 Dec 12. PMID: 16109604; PMCID: [id number]. NCCN Clinical Practice Guidelines in Oncology (NCCN Guidelines) Myeloproliferative Neoplasms Version 3.2022 - June 09, 2021. Swerdlow SH, Programmer, multimedia. WHO classif ication of Tumours of Haematopoietic and Lymphoid Tissues. 4th edn. Johanna Mutter, [place]: Geologist, engineering for [employer] on [employer]; 2017. Tefferi A. Primary myelofibrosis: 2021 update on diagnosis, risk-stratification and management. Am J Hematol. 2021 Jan;96(1):145-162. doi: 10.1002/ajh.26050. Epub 2020 Dec 2. PMID: 78295621. Vainchenker W, Kralovics R. Genetic basis and molecular pathophysiology of classical myeloproliferative neoplasms. Blood. 2017 Feb 9;129(6):667-679. doi: 10.1182/blood-2016-10-695940. Epub 2016 Dec 27. PMID: 30865784.    Director Review Comment     Comment: (NOTE) Brendolyn Callas, PhD, [facility]    Director, Molecular Oncology    [facility]    [place], [place] [address]    [phone] Performed At: [facility] [address] [place], [place] [id number] Adams Adams MDPhD QI:[id number] Performed At: [facility] [address] [place],  [place] [id number] Adams Adams MDPhD [id number]   Erythropoietin      Status: None   Collection Time: 02/12/24 11:05 AM  Result Value Ref Range   Erythropoietin  5.3 2.6 - 18.5 mIU/mL    Comment: (NOTE) Beckman Coulter UniCel DxI 800 Immunoassay System Values obtained with different assay methods or kits cannot be used interchangeably. Results cannot be interpreted as absolute evidence of the presence or absence of malignant disease. Performed At: [facility] [address] [place], [place] [id number] Pearlean Botts MD FT:[id number]   Ferritin     Status: Abnormal   Collection Time: 02/12/24 11:05 AM  Result Value Ref Range   Ferritin 353 (H) 24 - 336 ng/mL    Comment: Performed at [facility], [address] [place], [place] [address]  Iron and TIBC     Status: None   Collection Time: 02/12/24 11:05 AM  Result Value Ref Range   Iron 122 45 - 182 ug/dL   TIBC 628 315 - 176 ug/dL   Saturation Ratios 32 17.9 - 39.5 %   UIBC 263 ug/dL    Comment: Performed at [facility], [address], [place], [place] [address]  CMP (Cancer Center only)     Status: Abnormal   Collection Time: 02/12/24 11:05 AM  Result Value Ref Range   Sodium 137 135 - 145 mmol/L   Potassium 4.2 3.5 - 5.1 mmol/L   Chloride 102 98 - 111 mmol/L   CO2 26 22 - 32 mmol/L   Glucose, Bld 94 70 - 99 mg/dL    Comment: Glucose reference range applies only to samples taken after fasting for at least 8 hours.   BUN 12 8 - 23 mg/dL   Creatinine 7.10 6.26 - 1.24 mg/dL   Calcium  9.8 8.9 - 10.3 mg/dL   Total Protein 7.8 6.5 - 8.1 g/dL   Albumin 4.5 3.5 - 5.0 g/dL   AST 41 15 - 41 U/L   ALT 47 (H) 0 - 44 U/L   Alkaline Phosphatase 94 38 - 126 U/L   Total Bilirubin 0.7 0.0 - 1.2 mg/dL   GFR, Estimated >94 >85 mL/min    Comment: (NOTE) Calculated using the CKD-EPI Creatinine Equation (2021)    Anion gap 9 5 - 15    Comment: Performed at [facility], [address] [place], [place] [address]  CBC with Differential (Cancer Center Only)     Status: None   Collection Time: 02/12/24 11:05 AM  Result Value Ref Range   WBC Count 5.7 4.0 - 10.5 K/uL   RBC 5.35 4.22 - 5.81 MIL/uL   Hemoglobin 16.7 13.0 - 17.0 g/dL   HCT 91.4 78.2 - 95.6 %   MCV 92.5 80.0 - 100.0 fL   MCH 31.2 26.0 - 34.0 pg   MCHC 33.7 30.0 - 36.0 g/dL   RDW 21.3 08.6 - 57.8 %   Platelet Count 165 150 - 400 K/uL   nRBC 0.0 0.0 - 0.2 %   Neutrophils Relative % 62 %   Neutro Abs 3.5 1.7 - 7.7 K/uL   Lymphocytes Relative 23 %   Lymphs Abs 1.3 0.7 - 4.0 K/uL   Monocytes Relative 13 %   Monocytes Absolute 0.8 0.1 - 1.0 K/uL   Eosinophils Relative 1 %   Eosinophils Absolute 0.0 0.0 - 0.5 K/uL   Basophils Relative 1 %   Basophils Absolute 0.1 0.0 - 0.1 K/uL   Immature Granulocytes 0 %   Abs Immature Granulocytes 0.02 0.00 - 0.07 K/uL    Comment: Performed at [facility], [address], [place], [place] [address]  CALR +MPL + E12-E15 (reflexed)     Status: None   Collection Time: 02/12/24 11:05 AM  Result Value Ref Range   CALR Result Comment     Comment: (NOTE) NEGATIVE No insertions or deletions were detected within the analyzed region of the calreticulin (CALR) gene. A negative result does not entirely exclude the possibility of a clonal population carrying CALR gene mutations that are not covered by this assay. Results should be interpreted in conjunction with clinical and laboratory findings for the most accurate interpretation.    MPL Result Comment     Comment: (NOTE) NEGATIVE No MPL mutation was identified in the provided specimen of this individual. Results should be interpreted in conjunction with clinical and other laboratory findings for the most accurate interpretation.    E12-15 Result Comment     Comment: (NOTE) NEGATIVE    JAK2 mutations were not detected in exons 12, 13, 14 and 15. The G to T nucleotide change encoding the V617F  mutation was not detected. This result does not rule out the presence of JAK2 mutation at a level below the detection sensitivity of this assay, the presence of other mutations outside the analyzed region of the JAK2 gene, or the presence of a myeloproliferative or other neoplasm. Result must be correlated with other clinical data for the most accurate diagnosis. Performed At: [facility] [address] [place], [place] [id number] Adams Adams MDPhD MW:[id number]      RADIOGRAPHIC STUDIES:  No recent pertinent imaging studies available to review.  No orders of the defined types were placed in this encounter.    Future Appointments  Date Time Provider Department Center  05/13/2024 10:30 AM [facility] PHLEBOTOMIST [facility] None  05/13/2024 10:45 AM Omarius Grantham, Gale Jude, MD [facility] None    This document was completed utilizing speech recognition software. Grammatical errors, random word insertions, pronoun errors, and incomplete sentences are an occasional consequence of this system due to software limitations, ambient noise, and hardware issues. Any formal questions or concerns about the content, text or information contained within the body of this dictation should be directly addressed to the provider for clarification.

## 2024-02-26 NOTE — Assessment & Plan Note (Signed)
 He has a history of elevated red blood cell count, with a hemoglobin level of 18.4 g/dL and hematocrit of 65% noted in March 2025. Previous records from August 2024 showed a hemoglobin of 17.7 g/dL and hematocrit of 78.4%. In 2018, his hemoglobin was 18.5 g/dL and hematocrit was 69.6%.   No headaches, vision problems, chest pain, or trouble breathing. There is a concern for sleep apnea, as he wakes up at night. A sleep study has been suggested.  But because of his dementia history, patient could not cooperate.   He has a history of dementia and seizures. His last seizure occurred over a year ago, and he is currently on medication for seizure control.  On his initial consultation with us  on 02/12/2024, labs actually showed improved hemoglobin of 16.7, hematocrit 49.5.  White count and platelet count were within normal limits.  CMP, iron studies were unremarkable.  Erythropoietin  was within normal limits.  JAK2 mutation testing was negative.  Reflex testing to include CALR, MPL, exon 12-15 mutations were also negative.   Polycythemia is suspected secondary to sleep apnea, as he does not smoke and has no history of tobacco use.    The goal is to maintain hematocrit below 55 to prevent complications such as thrombosis and cerebrovascular accidents.  Therapeutic phlebotomy will be performed if hematocrit exceeds 55 to avoid hyperviscosity complications.  Currently no indication for therapeutic phlebotomy.  Will continue surveillance.

## 2024-04-04 ENCOUNTER — Other Ambulatory Visit: Payer: Self-pay | Admitting: Family Medicine

## 2024-04-04 DIAGNOSIS — N401 Enlarged prostate with lower urinary tract symptoms: Secondary | ICD-10-CM

## 2024-04-08 ENCOUNTER — Encounter: Payer: Self-pay | Admitting: *Deleted

## 2024-05-05 ENCOUNTER — Other Ambulatory Visit: Payer: Self-pay | Admitting: Oncology

## 2024-05-05 DIAGNOSIS — D751 Secondary polycythemia: Secondary | ICD-10-CM

## 2024-05-13 ENCOUNTER — Inpatient Hospital Stay

## 2024-05-13 ENCOUNTER — Inpatient Hospital Stay: Attending: Oncology | Admitting: Oncology

## 2024-06-12 ENCOUNTER — Encounter: Payer: Self-pay | Admitting: Family Medicine

## 2024-06-12 ENCOUNTER — Ambulatory Visit (INDEPENDENT_AMBULATORY_CARE_PROVIDER_SITE_OTHER): Admitting: Family Medicine

## 2024-06-12 VITALS — BP 142/82 | HR 76 | Wt 158.7 lb

## 2024-06-12 DIAGNOSIS — Z8639 Personal history of other endocrine, nutritional and metabolic disease: Secondary | ICD-10-CM

## 2024-06-12 DIAGNOSIS — K5909 Other constipation: Secondary | ICD-10-CM | POA: Diagnosis not present

## 2024-06-12 DIAGNOSIS — I10 Essential (primary) hypertension: Secondary | ICD-10-CM

## 2024-06-12 DIAGNOSIS — N401 Enlarged prostate with lower urinary tract symptoms: Secondary | ICD-10-CM | POA: Diagnosis not present

## 2024-06-12 DIAGNOSIS — G629 Polyneuropathy, unspecified: Secondary | ICD-10-CM | POA: Diagnosis not present

## 2024-06-12 DIAGNOSIS — R7989 Other specified abnormal findings of blood chemistry: Secondary | ICD-10-CM | POA: Diagnosis not present

## 2024-06-12 DIAGNOSIS — E785 Hyperlipidemia, unspecified: Secondary | ICD-10-CM | POA: Diagnosis not present

## 2024-06-12 DIAGNOSIS — F411 Generalized anxiety disorder: Secondary | ICD-10-CM | POA: Diagnosis not present

## 2024-06-12 DIAGNOSIS — R350 Frequency of micturition: Secondary | ICD-10-CM | POA: Diagnosis not present

## 2024-06-12 DIAGNOSIS — R569 Unspecified convulsions: Secondary | ICD-10-CM | POA: Diagnosis not present

## 2024-06-12 DIAGNOSIS — F0390 Unspecified dementia without behavioral disturbance: Secondary | ICD-10-CM | POA: Diagnosis not present

## 2024-06-12 LAB — POCT GLYCOSYLATED HEMOGLOBIN (HGB A1C): Hemoglobin A1C: 6 % — AB (ref 4.0–5.6)

## 2024-06-12 MED ORDER — LEVETIRACETAM 500 MG PO TABS: 500.0000 mg | ORAL_TABLET | Freq: Two times a day (BID) | ORAL | 1 refills | Status: DC

## 2024-06-12 MED ORDER — MEMANTINE HCL 5 MG PO TABS: 5.0000 mg | ORAL_TABLET | Freq: Two times a day (BID) | ORAL | 1 refills | Status: DC

## 2024-06-12 MED ORDER — TAMSULOSIN HCL 0.4 MG PO CAPS
0.4000 mg | ORAL_CAPSULE | Freq: Every day | ORAL | 1 refills | Status: DC
Start: 2024-06-12 — End: 2024-07-30

## 2024-06-12 MED ORDER — ROSUVASTATIN CALCIUM 40 MG PO TABS: 40.0000 mg | ORAL_TABLET | Freq: Every day | ORAL | 3 refills | Status: DC

## 2024-06-12 MED ORDER — SHINGRIX 50 MCG/0.5ML IM SUSR: INTRAMUSCULAR | 1 refills | Status: AC

## 2024-06-12 MED ORDER — VENLAFAXINE HCL ER 37.5 MG PO CP24: ORAL_CAPSULE | ORAL | 3 refills | Status: DC

## 2024-06-12 MED ORDER — GABAPENTIN 300 MG PO CAPS
300.0000 mg | ORAL_CAPSULE | Freq: Every day | ORAL | 1 refills | Status: DC
Start: 2024-06-12 — End: 2024-07-30

## 2024-06-12 MED ORDER — SENNOSIDES-DOCUSATE SODIUM 8.6-50 MG PO TABS: 1.0000 | ORAL_TABLET | Freq: Every evening | ORAL | 1 refills | Status: DC | PRN

## 2024-06-12 MED ORDER — AMLODIPINE BESYLATE-VALSARTAN 5-160 MG PO TABS: 1.0000 | ORAL_TABLET | Freq: Every day | ORAL | 1 refills | Status: DC

## 2024-06-12 MED ORDER — DONEPEZIL HCL 10 MG PO TABS: 10.0000 mg | ORAL_TABLET | Freq: Every day | ORAL | 1 refills | Status: DC

## 2024-06-12 NOTE — Assessment & Plan Note (Signed)
 142/82 upon repeat, has been out of medication.  Restart medication and follow-up in 2 weeks for nurse BP check.

## 2024-06-12 NOTE — Assessment & Plan Note (Signed)
 Stable, denies seizure activity.  Refill Keppra  500 mg twice daily

## 2024-06-12 NOTE — Assessment & Plan Note (Signed)
 Stable, refill donepezil  and memantine 

## 2024-06-12 NOTE — Patient Instructions (Addendum)
 It was wonderful to see you today! Thank you for choosing [facility].   Please bring ALL of your medications with you to every visit.   Today we talked about:  I refilled all of your medications today.  They should be available at the pharmacy and they gave you 20-month supply of everything with refills.  You can also get the shingles vaccine done at the pharmacy whenever you pick them up. Your blood pressure is elevated today likely due to not taking your medication.  I would like you to come back in 2 weeks for a nurse visit to have your blood pressure checked to make sure it is normal after restarting your medication. I referred you to the eye doctor for complete eye exam.  Our office will follow-up with you regarding this referral information.  Please follow up in 3 months with me and 2 weeks for nurse visit for blood pressure check   We are checking some labs today. If they are abnormal, I will call you. If they are normal, I will send you a MyChart message (if it is active) or a letter in the mail. If you do not hear about your labs in the next 2 weeks, please call the office.  Call the clinic at [phone] if your symptoms worsen or you have any concerns.  Please be sure to schedule follow up at the front desk before you leave today.   Izetta Nap, DO Family Medicine    ?? ???????? ????? ?????? ???? ???? ??????! ??? ????? ?????? ??????? ?????? ?????? ????????  ????? ???? ??? ????? ????? ????? ??? ??????? ????? ????????????  ?? ?????? ????? ??????? ??????:  ?. ???? ?? ??????? ??? ??????? ????? ????? ??????? ?????????? ?????? ???????? ? ????????? ???????? ??????????? ??? ????? ? ??????? ??????? ???? ??????? ?????????? ????????? ?????????? ?????? ??? ??? ????? ???????????  ?. ??????? ???? ?????? ?????? ????? ?? ??????? ??????? ????? ??? ????? ? ??????? ?? ????? ? ??????? ?????? ??????? ???? ???????? ???? ??????? ???? ???: ???? ?????? ?? ??????? ? ??? ?????????  ???? ??????? ??????? ???? ?????  ?. ???? ???????? ????? ???? ????????? ???? ???? ?????????? ???? ????? ?????? ?????????? ?? ????? ????????? ?????? ???????? ???-?? ???????  ????? ???? ? ??????? ???-?? ????????? ? ??????? ?????? ???? ?????? ??????? ???? ? ??????? ???-?? ?????????  ???? ?? ???? ????????????? ???? ????????? ??? ??????? ???????? ??? ???, ? ???????? ?? ???????? ??? ??????? ??????? ??? ???, ? ???????? MyChart ?????? (??? ?? ?????? ? ???) ?? ????? ???? ????????? ??? ??????? ????? ? ??????? ????? ??????????????? ?????? ????????? ???, ????? ?????????? ?? ??????????  ??? ??????? ???????? ??????? ?? ???????? ???? ?????? ? ??? (???)???-???? ?? ????????? ?? ??????????  ????? ?? ???? ??? ?????? ??????? ????? ?????? ????? ??????? ??????????  ???? ??????????, DO ????????? ???????? ?ja tap?'?nl?'? bh??na p?'um?d? dh?rai khu?? l?gy?! K?na h?ltha ph?mil? m??isina r?jnu bha'?k?m? dhan'yav?da.  Kr?pay? har?ka pa?aka bhrama?a gard? ?phn? sabai au?adhihar? s?tham? ly?'unuh?s.  ?ja h?m?l? nimna kur?har? garyau?:  1. Mail? ?ja tap?'??k? sabai au?adhihar? riphila gar??. Tin?har? ph?rm?s?m? upalabdha hunuparcha ra tin?har?l? tap?'?nl?'? riphilasahitak? sabai c?jak? 3 mahin?k? ?p?rti di'?. Tap?'?nl? tin?har?l?'? linuhum?d? ph?rm?s?m? d?dur? kh?pa pani lag?'una saknuhuncha.  2. Tap?'??k? au?adhi nali'?k? k?ra?al? gard? ?ja tap?'??k? raktac?pa ba?h?k? huna sakcha. Ma c?hanchu ki tap?'?? 2 hapt?m? narsak? bhrama?ak? l?gi ?'unuh?s t?ki tap?'??k? au?adhi puna: Suru gar?pachi y? s?m?n'ya cha bhan? suni?cita garna tap?'??k? raktac?pa j?m?ca h?s.  3. Mail? tap?'?nl?'? p?r?a ?m?kh? par?k?a?ak? l?gi ?m?kh? ??k?arakah?m? r?phara gar??. H?mr? k?ry?layal? y? r?pharala j?nak?r?k?  b?r?m? tap?'?nsam?ga phal?-apa garn?cha.  Kr?pay? masam?ga 3 mahin?m? phal?-apa garnuh?s ra raktac?pa j?m?cak? l?gi narsak? bhrama?ak? l?gi 2 hapt?m? phal?-apa garnuh?s  h?m? ?ja k?h? pray?ga??l?har? j?m?ca gardaichau?SABRA Lister tin?har?  as?m?n'ya chan bhan?, ma tap?'?nl?'? kala garn?chu. Yadi tin?har? s?m?n'ya chan bhan?, ma tap?'?nl?'? MyChart sand??a (yadi y? sakriya cha bhan?) v? m?lam? patra pa?h?'un?chu. Yadi tap?'?nl? ark? 2 hapt?m? ?phn? pray?ga??l?har?k? b?r?m? sunnubha'?na bhan?, kr?pay? k?ry?layam? kala garnuh?s.  Yadi tap?'??k? lak?a?ahar? bigri'? v? tap?'?nl?'? [place] cint? cha bhan? [phone] m? klinikam? kala garnuh?s.  Kr?pay? ?ja j?nu aghi phran?a ??skam? phal?'apa t?lik? ban?'una ni?cita garnuh?s.  K??? harn?n??ja, DO p?riv?rika cikits?

## 2024-06-12 NOTE — Progress Notes (Signed)
    SUBJECTIVE:   CHIEF COMPLAINT / HPI:   Hypertension: - Medications: Amlodpine-Valsartan  5-160mg  daily - Compliance: Has been out for 2 days. - Checking BP at home: Not checking at home.  - Denies any SOB, CP, vision changes, LE edema, medication SEs, or symptoms of hypotension  Diabetes Current Regimen: None currently CBGs: None Last A1c:  Lab Results  Component Value Date   HGBA1C 6.0 (A) 06/12/2024    Denies polyuria, polydipsia, hypoglycemia  Last Eye Exam: Referred Statin: Rosouvastatin 40mg  daily ACE/ARB: Valsartan  160mg  daily   PERTINENT  PMH / PSH: HTN, GERD, dementia, BPH, secondary polycythemia, HLD, T2DM  OBJECTIVE:   BP (!) 142/82   Pulse 76   Wt 158 lb 11 oz (72 kg)   SpO2 96%   BMI 26.41 kg/m    General: NAD, pleasant, able to participate in exam Cardiac: RRR, no murmurs. Respiratory: CTAB, normal effort, No wheezes, rales or rhonchi Extremities: no edema or cyanosis. Skin: warm and dry, no rashes noted Neuro: alert, no obvious focal deficits Psych: Normal affect and mood  ASSESSMENT/PLAN:   Assessment & Plan History of diabetes mellitus A1c 6.0, not currently on medication management.  Suspect he is only borderline diabetic, will continue with lifestyle management. -ACR -Referral for diabetic eye exam -Repeat A1c in 6 months to 1 year Dementia, unspecified dementia severity, unspecified dementia type, unspecified whether behavioral, psychotic, or mood disturbance or anxiety (HCC) Stable, refill donepezil  and memantine  Neuropathy Stable, refill gabapentin  Seizure-like activity (HCC) Stable, denies seizure activity.  Refill Keppra  500 mg twice daily Hyperlipidemia, unspecified hyperlipidemia type Due for annual lipid panel.  Refill rosuvastatin  40 mg daily Chronic constipation Refill senna as needed Benign prostatic hyperplasia with urinary frequency Stable, refill tamsulosin  0.4 mg daily Generalized anxiety disorder Stable, refill  venlafaxine  37.5 mg daily Essential hypertension 142/82 upon repeat, has been out of medication.  Restart medication and follow-up in 2 weeks for nurse BP check. Elevated TSH Previously elevated with normal T4.  Repeat TSH today.    *Provided Rx for shingles vaccine   Dr. Izetta Nap, DO [facility]

## 2024-06-12 NOTE — Assessment & Plan Note (Signed)
 Stable, refill gabapentin

## 2024-06-12 NOTE — Assessment & Plan Note (Signed)
 A1c 6.0, not currently on medication management.  Suspect he is only borderline diabetic, will continue with lifestyle management. -ACR -Referral for diabetic eye exam -Repeat A1c in 6 months to 1 year

## 2024-06-12 NOTE — Assessment & Plan Note (Signed)
 Stable, refill tamsulosin  0.4 mg daily

## 2024-06-12 NOTE — Assessment & Plan Note (Signed)
 Due for annual lipid panel.  Refill rosuvastatin  40 mg daily

## 2024-06-12 NOTE — Assessment & Plan Note (Signed)
 Refill senna as needed

## 2024-06-13 LAB — LIPID PANEL
Chol/HDL Ratio: 4.1 ratio (ref 0.0–5.0)
Cholesterol, Total: 264 mg/dL — ABNORMAL HIGH (ref 100–199)
HDL: 64 mg/dL (ref >39)
LDL Chol Calc (NIH): 166 mg/dL — ABNORMAL HIGH (ref 0–99)
Triglycerides: 187 mg/dL — ABNORMAL HIGH (ref 0–149)
VLDL Cholesterol Cal: 34 mg/dL (ref 5–40)

## 2024-06-13 LAB — TSH RFX ON ABNORMAL TO FREE T4: TSH: 0.479 u[IU]/mL (ref 0.450–4.500)

## 2024-06-14 LAB — MICROALBUMIN / CREATININE URINE RATIO
Creatinine, Urine: 18.3 mg/dL
Microalb/Creat Ratio: 611 mg/g{creat} — ABNORMAL HIGH (ref 0–29)
Microalbumin, Urine: 111.8 ug/mL

## 2024-06-16 ENCOUNTER — Ambulatory Visit: Payer: Self-pay | Admitting: Family Medicine

## 2024-06-16 DIAGNOSIS — Z8639 Personal history of other endocrine, nutritional and metabolic disease: Secondary | ICD-10-CM

## 2024-06-16 DIAGNOSIS — R809 Proteinuria, unspecified: Secondary | ICD-10-CM

## 2024-06-16 MED ORDER — EMPAGLIFLOZIN 10 MG PO TABS: 10.0000 mg | ORAL_TABLET | Freq: Every day | ORAL | 1 refills | Status: DC

## 2024-06-16 NOTE — Telephone Encounter (Signed)
 Spoke with patient's son Manuel Reyes who is preferred contact and interpreter for him.    Lab results showed elevated LDL, his son does report he has been out of his medication for about a month and has now restarted it.  Recommend repeat LDL in 3 to 6 months with him back on the medication.  ACR 611, severely increased and no prior readings for comparison.  Already on valsartan , recommend initiation of SGLT2 given history of diabetes.  Patient son agreeable, will start Jardiance  10 mg daily.  Repeat ACR in 3 months.  Izetta Nap, DO

## 2024-07-11 ENCOUNTER — Other Ambulatory Visit: Payer: Self-pay | Admitting: Family Medicine

## 2024-07-11 DIAGNOSIS — G629 Polyneuropathy, unspecified: Secondary | ICD-10-CM

## 2024-07-28 ENCOUNTER — Encounter: Payer: Self-pay | Admitting: Family Medicine

## 2024-07-28 NOTE — Progress Notes (Unsigned)
 Received note from DSS Director Manuel Reyes (copy to chart) They are re certifying for CAP/DA services and needed to notify us  of med differences since last year. He was recently seen 99/09/2024) and med list updated.  Per DSS letter they asked if maybe 90 day rx and mail order refills might benefit patient, so  am routing to Dr. Theophilus who is PCP for her information. She recently filled his rx for this.

## 2024-07-30 ENCOUNTER — Other Ambulatory Visit: Payer: Self-pay

## 2024-07-30 ENCOUNTER — Other Ambulatory Visit (HOSPITAL_COMMUNITY): Payer: Self-pay

## 2024-07-30 ENCOUNTER — Telehealth: Payer: Self-pay | Admitting: Pharmacist

## 2024-07-30 DIAGNOSIS — R809 Proteinuria, unspecified: Secondary | ICD-10-CM

## 2024-07-30 DIAGNOSIS — F0390 Unspecified dementia without behavioral disturbance: Secondary | ICD-10-CM

## 2024-07-30 DIAGNOSIS — R569 Unspecified convulsions: Secondary | ICD-10-CM

## 2024-07-30 DIAGNOSIS — K5909 Other constipation: Secondary | ICD-10-CM

## 2024-07-30 DIAGNOSIS — Z8639 Personal history of other endocrine, nutritional and metabolic disease: Secondary | ICD-10-CM

## 2024-07-30 DIAGNOSIS — E785 Hyperlipidemia, unspecified: Secondary | ICD-10-CM

## 2024-07-30 DIAGNOSIS — N401 Enlarged prostate with lower urinary tract symptoms: Secondary | ICD-10-CM

## 2024-07-30 DIAGNOSIS — I10 Essential (primary) hypertension: Secondary | ICD-10-CM

## 2024-07-30 DIAGNOSIS — G629 Polyneuropathy, unspecified: Secondary | ICD-10-CM

## 2024-07-30 DIAGNOSIS — F411 Generalized anxiety disorder: Secondary | ICD-10-CM

## 2024-07-30 MED ORDER — ROSUVASTATIN CALCIUM 40 MG PO TABS
40.0000 mg | ORAL_TABLET | Freq: Every day | ORAL | 3 refills | Status: AC
  Filled 2024-07-30 (×2): qty 90, 90d supply, fill #0
  Filled 2024-08-26: qty 30, 30d supply, fill #0
  Filled 2024-09-17 – 2024-10-03 (×2): qty 30, 30d supply, fill #1
  Filled 2024-11-10 (×2): qty 30, 30d supply, fill #2

## 2024-07-30 MED ORDER — SENNOSIDES-DOCUSATE SODIUM 8.6-50 MG PO TABS
1.0000 | ORAL_TABLET | Freq: Every evening | ORAL | 3 refills | Status: AC | PRN
  Filled 2024-07-30 (×2): qty 90, 90d supply, fill #0
  Filled 2024-08-26: qty 30, 30d supply, fill #0
  Filled 2024-09-17 – 2024-10-03 (×2): qty 30, 30d supply, fill #1

## 2024-07-30 MED ORDER — EMPAGLIFLOZIN 10 MG PO TABS
10.0000 mg | ORAL_TABLET | Freq: Every day | ORAL | 3 refills | Status: AC
  Filled 2024-07-30 (×2): qty 90, 90d supply, fill #0
  Filled 2024-08-26: qty 30, 30d supply, fill #0
  Filled 2024-09-17 – 2024-10-10 (×3): qty 30, 30d supply, fill #1
  Filled 2024-11-10 (×2): qty 30, 30d supply, fill #2

## 2024-07-30 MED ORDER — TAMSULOSIN HCL 0.4 MG PO CAPS
0.4000 mg | ORAL_CAPSULE | Freq: Every day | ORAL | 3 refills | Status: DC
  Filled 2024-07-30 (×2): qty 90, 90d supply, fill #0
  Filled 2024-08-26: qty 30, 30d supply, fill #0
  Filled 2024-09-17 – 2024-10-03 (×2): qty 30, 30d supply, fill #1

## 2024-07-30 MED ORDER — DONEPEZIL HCL 10 MG PO TABS
10.0000 mg | ORAL_TABLET | Freq: Every day | ORAL | 3 refills | Status: AC
  Filled 2024-07-30 (×2): qty 90, 90d supply, fill #0
  Filled 2024-08-26: qty 30, 30d supply, fill #0
  Filled 2024-09-17 – 2024-10-10 (×3): qty 30, 30d supply, fill #1
  Filled 2024-11-10 (×2): qty 30, 30d supply, fill #2

## 2024-07-30 MED ORDER — VENLAFAXINE HCL ER 37.5 MG PO CP24
37.5000 mg | ORAL_CAPSULE | Freq: Every day | ORAL | 3 refills | Status: AC
  Filled 2024-07-30 (×2): qty 90, 90d supply, fill #0
  Filled 2024-08-26: qty 30, 30d supply, fill #0
  Filled 2024-09-17 – 2024-10-03 (×2): qty 30, 30d supply, fill #1
  Filled 2024-11-10 (×2): qty 30, 30d supply, fill #2

## 2024-07-30 MED ORDER — LEVETIRACETAM 500 MG PO TABS
500.0000 mg | ORAL_TABLET | Freq: Two times a day (BID) | ORAL | 3 refills | Status: AC
  Filled 2024-07-30 (×2): qty 180, 90d supply, fill #0
  Filled 2024-08-26: qty 60, 30d supply, fill #0
  Filled 2024-09-17 – 2024-10-03 (×2): qty 60, 30d supply, fill #1
  Filled 2024-11-10 (×2): qty 60, 30d supply, fill #2

## 2024-07-30 MED ORDER — GABAPENTIN 300 MG PO CAPS
300.0000 mg | ORAL_CAPSULE | Freq: Every day | ORAL | 3 refills | Status: AC
  Filled 2024-07-30 (×2): qty 90, 90d supply, fill #0
  Filled 2024-08-26: qty 30, 30d supply, fill #0
  Filled 2024-09-17 – 2024-10-03 (×2): qty 30, 30d supply, fill #1
  Filled 2024-11-10 (×2): qty 30, 30d supply, fill #2

## 2024-07-30 MED ORDER — MEMANTINE HCL 5 MG PO TABS
5.0000 mg | ORAL_TABLET | Freq: Two times a day (BID) | ORAL | 3 refills | Status: AC
  Filled 2024-07-30 (×2): qty 180, 90d supply, fill #0
  Filled 2024-08-26: qty 60, 30d supply, fill #0
  Filled 2024-09-17 – 2024-10-03 (×2): qty 60, 30d supply, fill #1
  Filled 2024-11-10 (×2): qty 60, 30d supply, fill #2

## 2024-07-30 MED ORDER — AMLODIPINE BESYLATE-VALSARTAN 5-160 MG PO TABS
1.0000 | ORAL_TABLET | Freq: Every day | ORAL | 3 refills | Status: AC
  Filled 2024-07-30 (×2): qty 90, 90d supply, fill #0
  Filled 2024-08-26: qty 30, 30d supply, fill #0
  Filled 2024-09-17 – 2024-10-03 (×2): qty 30, 30d supply, fill #1
  Filled 2024-11-10 (×2): qty 30, 30d supply, fill #2

## 2024-07-30 NOTE — Telephone Encounter (Signed)
 Reviewed and agree with Dr Rennis plan.

## 2024-07-30 NOTE — Telephone Encounter (Signed)
 Patient in need for Adherence packaging.   Patient's son contacted and shared option of using [facility] for Adherence Packaging.  Explained process and provided number to enroll including sharing payment information with pharmacy.   Provided new prescription for 90 day supply with refills for all maintenance medications.  Contact Ester Dickinson for assistance with transition/enrollment via TEAMS message.   Total time with patient call and documentation of interaction: 22 minutes.

## 2024-08-04 ENCOUNTER — Other Ambulatory Visit: Payer: Self-pay

## 2024-08-13 ENCOUNTER — Other Ambulatory Visit: Payer: Self-pay

## 2024-08-26 ENCOUNTER — Other Ambulatory Visit (HOSPITAL_COMMUNITY): Payer: Self-pay

## 2024-08-26 ENCOUNTER — Other Ambulatory Visit: Payer: Self-pay

## 2024-08-27 ENCOUNTER — Other Ambulatory Visit: Payer: Self-pay

## 2024-08-28 ENCOUNTER — Other Ambulatory Visit (HOSPITAL_BASED_OUTPATIENT_CLINIC_OR_DEPARTMENT_OTHER): Payer: Self-pay

## 2024-08-28 ENCOUNTER — Other Ambulatory Visit: Payer: Self-pay

## 2024-09-01 ENCOUNTER — Encounter: Payer: Self-pay | Admitting: Radiology

## 2024-09-13 ENCOUNTER — Other Ambulatory Visit: Payer: Self-pay

## 2024-09-17 ENCOUNTER — Other Ambulatory Visit: Payer: Self-pay

## 2024-09-24 ENCOUNTER — Other Ambulatory Visit: Payer: Self-pay

## 2024-10-07 ENCOUNTER — Other Ambulatory Visit: Payer: Self-pay

## 2024-10-09 ENCOUNTER — Other Ambulatory Visit: Payer: Self-pay

## 2024-10-09 ENCOUNTER — Other Ambulatory Visit (HOSPITAL_COMMUNITY): Payer: Self-pay

## 2024-10-10 ENCOUNTER — Other Ambulatory Visit: Payer: Self-pay

## 2024-10-15 ENCOUNTER — Ambulatory Visit: Payer: Self-pay | Admitting: Family Medicine

## 2024-10-15 ENCOUNTER — Other Ambulatory Visit (HOSPITAL_COMMUNITY): Payer: Self-pay

## 2024-10-15 ENCOUNTER — Other Ambulatory Visit: Payer: Self-pay

## 2024-10-15 VITALS — BP 138/89 | HR 84 | Ht 65.0 in | Wt 156.0 lb

## 2024-10-15 DIAGNOSIS — R300 Dysuria: Secondary | ICD-10-CM | POA: Diagnosis present

## 2024-10-15 DIAGNOSIS — R350 Frequency of micturition: Secondary | ICD-10-CM | POA: Diagnosis not present

## 2024-10-15 DIAGNOSIS — N401 Enlarged prostate with lower urinary tract symptoms: Secondary | ICD-10-CM

## 2024-10-15 LAB — POCT URINALYSIS DIP (MANUAL ENTRY)
Bilirubin, UA: NEGATIVE
Blood, UA: NEGATIVE
Glucose, UA: NEGATIVE mg/dL
Ketones, POC UA: NEGATIVE mg/dL
Leukocytes, UA: NEGATIVE
Nitrite, UA: NEGATIVE
Protein Ur, POC: NEGATIVE mg/dL
Spec Grav, UA: 1.01 (ref 1.010–1.025)
Urobilinogen, UA: 0.2 U/dL
pH, UA: 6 (ref 5.0–8.0)

## 2024-10-15 MED ORDER — TAMSULOSIN HCL 0.4 MG PO CAPS
0.8000 mg | ORAL_CAPSULE | Freq: Every day | ORAL | 3 refills | Status: AC
  Filled 2024-10-15 – 2024-11-10 (×3): qty 90, 45d supply, fill #0
  Filled 2024-11-10: qty 60, 30d supply, fill #0

## 2024-10-15 NOTE — Patient Instructions (Signed)
 Thank you for coming in today! Here is a summary of what we discussed:  -We will increase the Flomax  to 2 tablets daily and see if this helps. Please schedule a follow up in 1 month so we can see how this is going  We are checking some labs today. If they are abnormal, I will call you. If they are normal, I will send you a MyChart message (if it is active) or a letter in the mail. If you do not hear about your labs in the next 2 weeks, please call the office.   Please call the clinic at [phone] if your symptoms worsen or you have any concerns.  Best, Dr Adele

## 2024-10-15 NOTE — Assessment & Plan Note (Signed)
 Symptoms consistent with worsening BPH symptoms.  UA negative for UTI.  Checking BMP to ensure no signs of obstructive AKI.  Will defer PSA given no family history of prostate cancer.  Will increase Flomax  to 0.8 mg daily, advised follow-up in 1 month.

## 2024-10-15 NOTE — Progress Notes (Signed)
° ° °  SUBJECTIVE:   CHIEF COMPLAINT / HPI:   Patient requested that his son interpret for him today  Difficulty urinating --on tamsulosin  0.4mg  daily, adherent with this --difficulty emptying bladder, some pain when he is trying to urinate, weak urinary stream, needs to urinate within 2 hours of last urination, strains when urinating --No rectal or abdominal pain -- No fevers or chills --no family history of prostate cancer  Already has flu shot a few months ago  Has appointment for eye exam  PERTINENT  PMH / PSH: chronic diastolic heart failure, HTN, GERD, dementia, DM  OBJECTIVE:   BP 138/89   Pulse 84   Ht 5' 5 (1.651 m)   Wt 156 lb (70.8 kg)   SpO2 100%   BMI 25.96 kg/m   - General: No acute distress. Awake and conversant. - Respiratory: Respirations are non-labored. - Skin: No visible rashes or ulcers. - Psych: Alert and oriented. Cooperative, Appropriate mood and affect, Normal judgment. - MSK: Normal ambulation. - Neuro: Sensation and CN II-XII grossly normal.  ASSESSMENT/PLAN:   Assessment & Plan Dysuria Benign prostatic hyperplasia with urinary frequency Symptoms consistent with worsening BPH symptoms.  UA negative for UTI.  Checking BMP to ensure no signs of obstructive AKI.  Will defer PSA given no family history of prostate cancer.  Will increase Flomax  to 0.8 mg daily, advised follow-up in 1 month.     Rea Raring, MD [facility]

## 2024-10-16 ENCOUNTER — Ambulatory Visit: Payer: Self-pay | Admitting: Family Medicine

## 2024-10-16 LAB — BASIC METABOLIC PANEL WITH GFR
BUN/Creatinine Ratio: 8 — ABNORMAL LOW (ref 10–24)
BUN: 6 mg/dL — ABNORMAL LOW (ref 8–27)
CO2: 25 mmol/L (ref 20–29)
Calcium: 9.8 mg/dL (ref 8.6–10.2)
Chloride: 102 mmol/L (ref 96–106)
Creatinine, Ser: 0.74 mg/dL — ABNORMAL LOW (ref 0.76–1.27)
Glucose: 107 mg/dL — ABNORMAL HIGH (ref 70–99)
Potassium: 4.5 mmol/L (ref 3.5–5.2)
Sodium: 142 mmol/L (ref 134–144)
eGFR: 97 mL/min/1.73 (ref >59)

## 2024-11-10 ENCOUNTER — Other Ambulatory Visit: Payer: Self-pay

## 2024-11-11 ENCOUNTER — Other Ambulatory Visit (HOSPITAL_COMMUNITY): Payer: Self-pay

## 2024-11-11 ENCOUNTER — Other Ambulatory Visit: Payer: Self-pay

## 2024-11-12 ENCOUNTER — Other Ambulatory Visit (HOSPITAL_COMMUNITY): Payer: Self-pay

## 2024-11-12 ENCOUNTER — Other Ambulatory Visit: Payer: Self-pay
